# Patient Record
Sex: Male | Born: 1955 | Race: White | Hispanic: No | Marital: Married | Smoking: Current every day smoker
Health system: Southern US, Community
[De-identification: ages and names within clinical notes are randomized; demographics above are authoritative.]

## PROBLEM LIST (undated history)

## (undated) DIAGNOSIS — I2510 Atherosclerotic heart disease of native coronary artery without angina pectoris: Secondary | ICD-10-CM

## (undated) DIAGNOSIS — E1140 Type 2 diabetes mellitus with diabetic neuropathy, unspecified: Secondary | ICD-10-CM

## (undated) DIAGNOSIS — M1990 Unspecified osteoarthritis, unspecified site: Secondary | ICD-10-CM

## (undated) DIAGNOSIS — E785 Hyperlipidemia, unspecified: Secondary | ICD-10-CM

## (undated) DIAGNOSIS — Z720 Tobacco use: Secondary | ICD-10-CM

## (undated) DIAGNOSIS — K219 Gastro-esophageal reflux disease without esophagitis: Secondary | ICD-10-CM

## (undated) DIAGNOSIS — L97529 Non-pressure chronic ulcer of other part of left foot with unspecified severity: Secondary | ICD-10-CM

## (undated) DIAGNOSIS — I10 Essential (primary) hypertension: Secondary | ICD-10-CM

## (undated) DIAGNOSIS — Z87440 Personal history of urinary (tract) infections: Secondary | ICD-10-CM

## (undated) DIAGNOSIS — D649 Anemia, unspecified: Secondary | ICD-10-CM

## (undated) DIAGNOSIS — N529 Male erectile dysfunction, unspecified: Secondary | ICD-10-CM

## (undated) DIAGNOSIS — E1142 Type 2 diabetes mellitus with diabetic polyneuropathy: Secondary | ICD-10-CM

## (undated) DIAGNOSIS — M869 Osteomyelitis, unspecified: Secondary | ICD-10-CM

## (undated) DIAGNOSIS — I119 Hypertensive heart disease without heart failure: Secondary | ICD-10-CM

## (undated) DIAGNOSIS — I5020 Unspecified systolic (congestive) heart failure: Secondary | ICD-10-CM

## (undated) DIAGNOSIS — R55 Syncope and collapse: Secondary | ICD-10-CM

## (undated) DIAGNOSIS — M5430 Sciatica, unspecified side: Secondary | ICD-10-CM

## (undated) DIAGNOSIS — Z972 Presence of dental prosthetic device (complete) (partial): Secondary | ICD-10-CM

## (undated) DIAGNOSIS — E119 Type 2 diabetes mellitus without complications: Secondary | ICD-10-CM

## (undated) DIAGNOSIS — K4090 Unilateral inguinal hernia, without obstruction or gangrene, not specified as recurrent: Secondary | ICD-10-CM

## (undated) DIAGNOSIS — E7800 Pure hypercholesterolemia, unspecified: Secondary | ICD-10-CM

## (undated) DIAGNOSIS — I25118 Atherosclerotic heart disease of native coronary artery with other forms of angina pectoris: Secondary | ICD-10-CM

## (undated) HISTORY — DX: Unspecified systolic (congestive) heart failure: I50.20

## (undated) HISTORY — DX: Syncope and collapse: R55

## (undated) HISTORY — DX: Atherosclerotic heart disease of native coronary artery without angina pectoris: I25.10

## (undated) HISTORY — DX: Hypertensive heart disease without heart failure: I11.9

## (undated) HISTORY — DX: Osteomyelitis, unspecified: M86.9

## (undated) HISTORY — DX: Tobacco use: Z72.0

## (undated) HISTORY — PX: WISDOM TOOTH EXTRACTION: SHX21

## (undated) HISTORY — DX: Sciatica, unspecified side: M54.30

## (undated) HISTORY — PX: GUM SURGERY: SHX658

## (undated) HISTORY — DX: Hyperlipidemia, unspecified: E78.5

## (undated) HISTORY — DX: Male erectile dysfunction, unspecified: N52.9

## (undated) HISTORY — DX: Type 2 diabetes mellitus with diabetic neuropathy, unspecified: E11.40

## (undated) HISTORY — DX: Personal history of urinary (tract) infections: Z87.440

## (undated) HISTORY — DX: Pure hypercholesterolemia, unspecified: E78.00

## (undated) HISTORY — DX: Type 2 diabetes mellitus without complications: E11.9

## (undated) HISTORY — DX: Unilateral inguinal hernia, without obstruction or gangrene, not specified as recurrent: K40.90

---
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
## 1999-11-10 HISTORY — PX: ELBOW ARTHROSCOPY: SHX614

## 2000-03-12 ENCOUNTER — Emergency Department (HOSPITAL_COMMUNITY): Admission: EM | Admit: 2000-03-12 | Discharge: 2000-03-13 | Payer: Self-pay | Admitting: Internal Medicine

## 2000-03-12 ENCOUNTER — Encounter: Payer: Self-pay | Admitting: Internal Medicine

## 2000-03-14 ENCOUNTER — Emergency Department (HOSPITAL_COMMUNITY): Admission: EM | Admit: 2000-03-14 | Discharge: 2000-03-14 | Payer: Self-pay | Admitting: Emergency Medicine

## 2002-11-09 HISTORY — PX: COLONOSCOPY: SHX174

## 2011-04-27 ENCOUNTER — Ambulatory Visit: Payer: Self-pay | Admitting: Family Medicine

## 2011-11-09 ENCOUNTER — Encounter (HOSPITAL_BASED_OUTPATIENT_CLINIC_OR_DEPARTMENT_OTHER): Payer: Self-pay | Admitting: *Deleted

## 2011-11-09 NOTE — Progress Notes (Signed)
To come in for [id number] 

## 2011-11-11 ENCOUNTER — Other Ambulatory Visit: Payer: Self-pay

## 2011-11-11 ENCOUNTER — Encounter (HOSPITAL_BASED_OUTPATIENT_CLINIC_OR_DEPARTMENT_OTHER)
Admission: RE | Admit: 2011-11-11 | Discharge: 2011-11-11 | Disposition: A | Discharge status: Home / Self Care | Payer: 59 | Source: Ambulatory Visit | Attending: Orthopedic Surgery | Admitting: Orthopedic Surgery

## 2011-11-11 LAB — BASIC METABOLIC PANEL
CO2: 27 mEq/L (ref 19–32)
Calcium: 9.1 mg/dL (ref 8.4–10.5)
Chloride: 103 mEq/L (ref 96–112)
GFR calc non Af Amer: >90 mL/min (ref >90)
Potassium: 4.2 mEq/L (ref 3.5–5.1)
Sodium: 137 mEq/L (ref 135–145)

## 2011-11-12 ENCOUNTER — Ambulatory Visit (HOSPITAL_BASED_OUTPATIENT_CLINIC_OR_DEPARTMENT_OTHER): Payer: 59 | Admitting: Anesthesiology

## 2011-11-12 ENCOUNTER — Ambulatory Visit (HOSPITAL_BASED_OUTPATIENT_CLINIC_OR_DEPARTMENT_OTHER)
Admission: RE | Admit: 2011-11-12 | Discharge: 2011-11-12 | Disposition: A | Discharge status: Home / Self Care | Payer: 59 | Source: Ambulatory Visit | Attending: Orthopedic Surgery | Admitting: Orthopedic Surgery

## 2011-11-12 ENCOUNTER — Encounter (HOSPITAL_BASED_OUTPATIENT_CLINIC_OR_DEPARTMENT_OTHER): Payer: Self-pay | Admitting: Anesthesiology

## 2011-11-12 ENCOUNTER — Encounter (HOSPITAL_BASED_OUTPATIENT_CLINIC_OR_DEPARTMENT_OTHER): Payer: Self-pay | Admitting: *Deleted

## 2011-11-12 ENCOUNTER — Encounter (HOSPITAL_BASED_OUTPATIENT_CLINIC_OR_DEPARTMENT_OTHER)
Admission: RE | Disposition: A | Discharge status: Home / Self Care | Payer: Self-pay | Source: Ambulatory Visit | Attending: Orthopedic Surgery

## 2011-11-12 DIAGNOSIS — M7021 Olecranon bursitis, right elbow: Secondary | ICD-10-CM

## 2011-11-12 DIAGNOSIS — M7020 Olecranon bursitis, unspecified elbow: Secondary | ICD-10-CM | POA: Insufficient documentation

## 2011-11-12 DIAGNOSIS — Z01810 Encounter for preprocedural cardiovascular examination: Secondary | ICD-10-CM | POA: Insufficient documentation

## 2011-11-12 DIAGNOSIS — Z01812 Encounter for preprocedural laboratory examination: Secondary | ICD-10-CM | POA: Insufficient documentation

## 2011-11-12 HISTORY — DX: Unspecified osteoarthritis, unspecified site: M19.90

## 2011-11-12 HISTORY — DX: Essential (primary) hypertension: I10

## 2011-11-12 HISTORY — DX: Gastro-esophageal reflux disease without esophagitis: K21.9

## 2011-11-12 HISTORY — PX: OLECRANON BURSECTOMY: SHX2097

## 2011-11-12 LAB — GLUCOSE, CAPILLARY: Glucose-Capillary: 116 mg/dL — ABNORMAL HIGH (ref 70–99)

## 2011-11-12 SURGERY — BURSECTOMY, ELBOW
Anesthesia: General | Laterality: Right

## 2011-11-12 MED ORDER — KETOROLAC TROMETHAMINE 30 MG/ML IJ SOLN: 15.0000 mg | Freq: Once | INTRAMUSCULAR | Status: DC | PRN

## 2011-11-12 MED ORDER — MIDAZOLAM HCL 5 MG/5ML IJ SOLN
INTRAMUSCULAR | Status: DC | PRN
  Administered 2011-11-12: 1 mg via INTRAVENOUS

## 2011-11-12 MED ORDER — BUPIVACAINE HCL 0.25 % IJ SOLN
INTRAMUSCULAR | Status: DC | PRN
  Administered 2011-11-12: 6 mL

## 2011-11-12 MED ORDER — FENTANYL CITRATE 0.05 MG/ML IJ SOLN
INTRAMUSCULAR | Status: DC | PRN
  Administered 2011-11-12: 50 ug via INTRAVENOUS
  Administered 2011-11-12: 100 ug via INTRAVENOUS

## 2011-11-12 MED ORDER — LACTATED RINGERS IV SOLN
INTRAVENOUS | Status: DC
  Administered 2011-11-12 (×2): via INTRAVENOUS

## 2011-11-12 MED ORDER — PROMETHAZINE HCL 25 MG/ML IJ SOLN: 6.2500 mg | INTRAMUSCULAR | Status: DC | PRN

## 2011-11-12 MED ORDER — CEFAZOLIN SODIUM 1-5 GM-% IV SOLN
1.0000 g | INTRAVENOUS | Status: AC
  Administered 2011-11-12: 1 g via INTRAVENOUS

## 2011-11-12 MED ORDER — HYDROMORPHONE HCL PF 1 MG/ML IJ SOLN
0.2500 mg | INTRAMUSCULAR | Status: DC | PRN
  Administered 2011-11-12 (×4): 0.5 mg via INTRAVENOUS

## 2011-11-12 MED ORDER — LIDOCAINE HCL (CARDIAC) 20 MG/ML IV SOLN
INTRAVENOUS | Status: DC | PRN
  Administered 2011-11-12: 100 mg via INTRAVENOUS

## 2011-11-12 MED ORDER — HYDROCODONE-ACETAMINOPHEN 5-325 MG PO TABS: 1.0000 | ORAL_TABLET | Freq: Four times a day (QID) | ORAL | Status: AC | PRN

## 2011-11-12 MED ORDER — MEPERIDINE HCL 25 MG/ML IJ SOLN: 6.2500 mg | INTRAMUSCULAR | Status: DC | PRN

## 2011-11-12 MED ORDER — DEXAMETHASONE SODIUM PHOSPHATE 4 MG/ML IJ SOLN
INTRAMUSCULAR | Status: DC | PRN
  Administered 2011-11-12: 4 mg via INTRAVENOUS

## 2011-11-12 MED ORDER — ONDANSETRON HCL 4 MG/2ML IJ SOLN
INTRAMUSCULAR | Status: DC | PRN
  Administered 2011-11-12: 4 mg via INTRAVENOUS

## 2011-11-12 MED ORDER — CHLORHEXIDINE GLUCONATE 4 % EX LIQD: 60.0000 mL | Freq: Once | CUTANEOUS | Status: DC

## 2011-11-12 MED ORDER — PROPOFOL 10 MG/ML IV EMUL
INTRAVENOUS | Status: DC | PRN
  Administered 2011-11-12: 200 mg via INTRAVENOUS

## 2011-11-12 MED ORDER — HYDROCODONE-ACETAMINOPHEN 5-325 MG PO TABS
1.0000 | ORAL_TABLET | Freq: Four times a day (QID) | ORAL | Status: DC | PRN
  Administered 2011-11-12: 1 via ORAL

## 2011-11-12 SURGICAL SUPPLY — 57 items
BANDAGE CONFORM 3  STR LF (GAUZE/BANDAGES/DRESSINGS) IMPLANT
BANDAGE ELASTIC 4 VELCRO ST LF (GAUZE/BANDAGES/DRESSINGS) ×4 IMPLANT
BLADE SURG 15 STRL LF DISP TIS (BLADE) ×1 IMPLANT
BLADE SURG 15 STRL SS (BLADE) ×2
BNDG CMPR 9X4 STRL LF SNTH (GAUZE/BANDAGES/DRESSINGS) ×1
BNDG COHESIVE 4X5 TAN STRL (GAUZE/BANDAGES/DRESSINGS) ×2 IMPLANT
BNDG ESMARK 4X9 LF (GAUZE/BANDAGES/DRESSINGS) ×2 IMPLANT
BRUSH SCRUB EZ PLAIN DRY (MISCELLANEOUS) ×1 IMPLANT
CANISTER SUCTION 1200CC (MISCELLANEOUS) ×2 IMPLANT
CORDS BIPOLAR (ELECTRODE) ×1 IMPLANT
COVER TABLE BACK 60X90 (DRAPES) ×2 IMPLANT
CUFF TOURNIQUET SINGLE 18IN (TOURNIQUET CUFF) IMPLANT
DECANTER SPIKE VIAL GLASS SM (MISCELLANEOUS) IMPLANT
DRAPE EXTREMITY T 121X128X90 (DRAPE) ×2 IMPLANT
DRSG EMULSION OIL 3X3 NADH (GAUZE/BANDAGES/DRESSINGS) ×2 IMPLANT
DURAPREP 26ML APPLICATOR (WOUND CARE) ×2 IMPLANT
ELECT NDL TIP 2.8 STRL (NEEDLE) IMPLANT
ELECT NEEDLE TIP 2.8 STRL (NEEDLE) IMPLANT
ELECT REM PT RETURN 9FT ADLT (ELECTROSURGICAL) ×2
ELECTRODE REM PT RTRN 9FT ADLT (ELECTROSURGICAL) IMPLANT
GOWN PREVENTION PLUS XXLARGE (GOWN DISPOSABLE) ×2 IMPLANT
NDL 1/2 CIR CATGUT .05X1.09 (NEEDLE) IMPLANT
NDL HYPO 25X1 1.5 SAFETY (NEEDLE) IMPLANT
NEEDLE 1/2 CIR CATGUT .05X1.09 (NEEDLE) IMPLANT
NEEDLE HYPO 25X1 1.5 SAFETY (NEEDLE) ×2 IMPLANT
NS IRRIG 1000ML POUR BTL (IV SOLUTION) ×2 IMPLANT
PACK BASIN DAY SURGERY FS (CUSTOM PROCEDURE TRAY) ×2 IMPLANT
PAD CAST 4YDX4 CTTN HI CHSV (CAST SUPPLIES) ×3 IMPLANT
PADDING CAST ABS 4INX4YD NS (CAST SUPPLIES)
PADDING CAST ABS COTTON 4X4 ST (CAST SUPPLIES) ×1 IMPLANT
PADDING CAST COTTON 4X4 STRL (CAST SUPPLIES) ×2
PENCIL BUTTON HOLSTER BLD 10FT (ELECTRODE) ×1 IMPLANT
SLING ARM FOAM STRAP LRG (SOFTGOODS) IMPLANT
SLING ARM FOAM STRAP MED (SOFTGOODS) IMPLANT
SPLINT FAST PLASTER 5X30 (CAST SUPPLIES)
SPLINT PLASTER CAST FAST 5X30 (CAST SUPPLIES) IMPLANT
STOCKINETTE 4X48 STRL (DRAPES) ×2 IMPLANT
SUCTION FRAZIER TIP 10 FR DISP (SUCTIONS) ×2 IMPLANT
SUT MNCRL AB 3-0 PS2 18 (SUTURE) IMPLANT
SUT MON AB 3-0 SH 27 (SUTURE)
SUT MON AB 3-0 SH27 (SUTURE) IMPLANT
SUT PROLENE 3 0 PS 1 (SUTURE) IMPLANT
SUT PROLENE 3 0 PS 2 (SUTURE) ×3 IMPLANT
SUT PROLENE 4 0 PS 2 18 (SUTURE) IMPLANT
SUT VIC AB 0 CT1 27 (SUTURE)
SUT VIC AB 0 CT1 27XBRD ANBCTR (SUTURE) IMPLANT
SUT VIC AB 2-0 PS2 27 (SUTURE) IMPLANT
SUT VIC AB 2-0 SH 27 (SUTURE)
SUT VIC AB 2-0 SH 27XBRD (SUTURE) IMPLANT
SUT VICRYL 4-0 PS2 18IN ABS (SUTURE) IMPLANT
SYR BULB 3OZ (MISCELLANEOUS) ×2 IMPLANT
SYR CONTROL 10ML LL (SYRINGE) ×1 IMPLANT
TOWEL OR 17X24 6PK STRL BLUE (TOWEL DISPOSABLE) ×2 IMPLANT
TRAY DSU PREP LF (CUSTOM PROCEDURE TRAY) ×2 IMPLANT
TUBE CONNECTING 20X1/4 (TUBING) ×2 IMPLANT
UNDERPAD 30X30 INCONTINENT (UNDERPADS AND DIAPERS) ×2 IMPLANT
WATER STERILE IRR 1000ML POUR (IV SOLUTION) ×1 IMPLANT

## 2011-11-12 NOTE — H&P (Signed)
Brian Dean is an 56 y.o. male.   Chief Complaint: RIGHT ELBOW WOUND HPI: CHRONIC RIGHT ELBOW WOUND/OLECRANON BURSITIS, PT ELECTS SURGERY FOR CHRONIC NONHEALING WOUND ON ELBOW. PT FOLLOWED FOR WEEKS IN OFFICE HERE FOR SURGERY.  Past Medical History  Diagnosis Date  . Hypertension   . GERD (gastroesophageal reflux disease)   . Arthritis     Past Surgical History  Procedure Date  . Elbow arthroscopy 2001    lt  . Colonoscopy   . Gum surgery   . Wisdom tooth extraction     History reviewed. No pertinent family history. Social History:  reports that he has been smoking.  He does not have any smokeless tobacco history on file. He reports that he drinks alcohol. He reports that he does not use illicit drugs.  Allergies: No Known Allergies  Medications Prior to Admission  Medication Dose Route Frequency Provider Last Rate Last Dose  . ceFAZolin (ANCEF) IVPB 1 g/50 mL premix  1 g Intravenous 60 min Pre-Op Sharma Covert      . chlorhexidine (HIBICLENS) 4 % liquid 4 application  60 mL Topical Once Louis Ivery W Trea Latner      . lactated ringers infusion   Intravenous Continuous Bedelia Person, MD 20 mL/hr at 11/12/11 1247     Medications Prior to Admission  Medication Sig Dispense Refill  . ascorbic acid (VITAMIN C) 250 MG CHEW Chew 250 mg by mouth daily.        Marland Kitchen HYDROcodone-acetaminophen (NORCO) 5-325 MG per tablet Take 1 tablet by mouth every 6 (six) hours as needed.        Marland Kitchen lisinopril (PRINIVIL,ZESTRIL) 5 MG tablet Take 5 mg by mouth daily.        . metFORMIN (GLUCOPHAGE) 500 MG tablet Take 500 mg by mouth every evening.        Marland Kitchen omeprazole (PRILOSEC) 20 MG capsule Take 20 mg by mouth daily.          Results for orders placed during the hospital encounter of 11/12/11 (from the past 48 hour(s))  BASIC METABOLIC PANEL     Status: Abnormal   Collection Time   11/11/11 12:19 PM      Component Value Range Comment   Sodium 137  135 - 145 (mEq/L)    Potassium 4.2  3.5 - 5.1 (mEq/L)    Chloride 103  96 - 112 (mEq/L)    CO2 27  19 - 32 (mEq/L)    Glucose, Bld 173 (*) 70 - 99 (mg/dL)    BUN 9  6 - 23 (mg/dL)    Creatinine, Ser 7.82  0.50 - 1.35 (mg/dL)    Calcium 9.1  8.4 - 10.5 (mg/dL)    GFR calc non Af Amer >90  >90 (mL/min)    GFR calc Af Amer >90  >90 (mL/min)   GLUCOSE, CAPILLARY     Status: Abnormal   Collection Time   11/12/11 12:51 PM      Component Value Range Comment   Glucose-Capillary 117 (*) 70 - 99 (mg/dL)    No results found.  NO RECENT ILLNESSES HOSPITALIZATIONS  Blood pressure 147/84, pulse 68, temperature 97.5 F (36.4 C), temperature source Oral, resp. rate 18, height 6' (1.829 m), weight 78.926 kg (174 lb), SpO2 99.00%.  General Appearance:  Alert, cooperative, no distress, appears stated age  Head:  Normocephalic, without obvious abnormality, atraumatic  Eyes:  Pupils equal, conjunctiva/corneas clear,         Throat: Lips, mucosa, and  tongue normal; teeth and gums normal  Neck: No visible masses     Lungs:   respirations unlabored  Chest Wall:  No tenderness or deformity  Heart:  Regular rate and rhythm,  Abdomen:   Soft, non-tender,         Extremities: RIGHT ELBOW IN BANDAGE. ABLE TO FLEX AND EXTEND THUMB FINGERS WARM WELL PERFUSED GOOD HAND STRENGTH  Pulses: 2+ and symmetric  Skin: Skin color, texture, turgor normal, no rashes or lesions     Neurologic: Normal    Assessment/Plan:  RIGHT ELBOW OLECRANON BURSITIS WITH OPEN WOUND  RIGHT ELBOW OLECRANON BURSECTOMY AND WOUND CLOSURE  R/B/A DISCUSSED WITH PT IN OFFICE.  PT VOICED UNDERSTANDING OF PLAN CONSENT SIGNED DAY OF SURGERY PT SEEN AND EXAMINED PRIOR TO OPERATIVE PROCEDURE/DAY OF SURGERY SITE MARKED. QUESTIONS ANSWERED WILL GO HOME FOLLOWING SURGERY  Sharma Covert 11/12/2011, 2:02 PM

## 2011-11-12 NOTE — Anesthesia Procedure Notes (Signed)
Procedure Name: LMA Insertion Date/Time: 11/12/2011 2:24 PM Performed by: Meyer Russel Pre-anesthesia Checklist: Patient identified, Emergency Drugs available, Suction available, Patient being monitored and Timeout performed Patient Re-evaluated:Patient Re-evaluated prior to inductionOxygen Delivery Method: Circle System Utilized Preoxygenation: Pre-oxygenation with 100% oxygen Intubation Type: IV induction Ventilation: Mask ventilation without difficulty LMA: LMA inserted LMA Size: 4.0 Number of attempts: 1 Placement Confirmation: breath sounds checked- equal and bilateral and positive ETCO2 Tube secured with: Tape Dental Injury: Teeth and Oropharynx as per pre-operative assessment

## 2011-11-12 NOTE — Anesthesia Postprocedure Evaluation (Signed)
  Anesthesia Post-op Note  Patient: Brian Dean  Procedure(s) Performed:  OLECRANON BURSA - olecracnon bursectomy with delayed closure  Patient Location: PACU  Anesthesia Type: General  Level of Consciousness: awake  Airway and Oxygen Therapy: Patient Spontanous Breathing  Post-op Pain: mild  Post-op Assessment: Post-op Vital signs reviewed  Post-op Vital Signs: stable  Complications: No apparent anesthesia complications

## 2011-11-12 NOTE — Anesthesia Preprocedure Evaluation (Addendum)
Anesthesia Evaluation  Patient identified by MRN, date of birth, ID band Patient awake    History of Anesthesia Complications Negative for: history of anesthetic complications  Airway Mallampati: I  Neck ROM: Full    Dental  (+) Partial Lower and Partial Upper   Pulmonary Current Smoker,  clear to auscultation        Cardiovascular hypertension, Pt. on medications Regular Normal    Neuro/Psych Negative Neurological ROS     GI/Hepatic Neg liver ROS, GERD-  ,  Endo/Other  Diabetes mellitus-  Renal/GU      Musculoskeletal   Abdominal   Peds  Hematology   Anesthesia Other Findings   Reproductive/Obstetrics                           Anesthesia Physical Anesthesia Plan  ASA: II  Anesthesia Plan: General   Post-op Pain Management:    Induction: Intravenous  Airway Management Planned: LMA  Additional Equipment:   Intra-op Plan:   Post-operative Plan: Extubation in OR  Informed Consent: I have reviewed the patients History and Physical, chart, labs and discussed the procedure including the risks, benefits and alternatives for the proposed anesthesia with the patient or authorized representative who has indicated his/her understanding and acceptance.   Dental advisory given  Plan Discussed with: CRNA and Surgeon  Anesthesia Plan Comments:         Anesthesia Quick Evaluation

## 2011-11-12 NOTE — Transfer of Care (Signed)
Immediate Anesthesia Transfer of Care Note  Patient: Brian Dean  Procedure(s) Performed:  OLECRANON BURSA - olecracnon bursectomy with delayed closure  Patient Location: PACU  Anesthesia Type: General  Level of Consciousness: sedated  Airway & Oxygen Therapy: Patient Spontanous Breathing and Patient connected to face mask oxygen  Post-op Assessment: Report given to PACU RN and Post -op Vital signs reviewed and stable  Post vital signs: stable  Complications: No apparent anesthesia complications

## 2011-11-12 NOTE — Brief Op Note (Signed)
11/12/2011  3:34 PM  PATIENT:  Brian Dean  56 y.o. male  PRE-OPERATIVE DIAGNOSIS:  right elbow open wound  POST-OPERATIVE DIAGNOSIS:  right elbow open wound  PROCEDURE:  Procedure(s): OLECRANON BURSA  SURGEON:  Surgeon(s): Sharma Covert  PHYSICIAN ASSISTANT:   ASSISTANTS: none   ANESTHESIA:   general  EBL:  Total I/O In: 1700 [I.V.:1700] Out: -   BLOOD ADMINISTERED:none  DRAINS: none   LOCAL MEDICATIONS USED:  MARCAINE 6CC  SPECIMEN:  No Specimen  DISPOSITION OF SPECIMEN:  N/A  COUNTS:  YES  TOURNIQUET:   Total Tourniquet Time Documented: Upper Arm (Right) - 8 minutes  DICTATION: [id number]  PLAN OF CARE: Discharge to home after PACU  PATIENT DISPOSITION:  PACU - hemodynamically stable.   Delay start of Pharmacological VTE agent (>24hrs) due to surgical blood loss or risk of bleeding:  {YES/NO/NOT APPLICABLE:20182

## 2011-11-13 NOTE — Op Note (Signed)
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  ATTENDING PHYSICIAN:  Madelynn Done, MD who was scrubbed and present for the entire procedure.  ASSISTANT SURGEON:  None.  SURGICAL PROCEDURE: 1. Right elbow olecranon bursectomy. 2. Right elbow rotational skin flap, less than 3 cm rotational flap.  ANESTHESIA:  General via LMA.  SURGICAL INDICATIONS:  Mr. Warehime is a 56 year old right-hand-dominant gentleman with persistent chronic right elbow wound with a draining bursa.  The patient elected to undergo the above procedure.  Risks, benefits, and alternatives were discussed in detail with the patient and signed informed consent was obtained.  Risks include, but not limited to bleeding, infection, damage to nearby nerves, arteries, or tendons, loss of motion of the elbow, wrist and digits, persistent wound and stiffness of the elbow and need for further surgical intervention.  DESCRIPTION OF PROCEDURE:  The patient was properly identified in the preop holding area.  With a marker pen, a mark was made on the right elbow to indicate the correct operative site.  The patient then brought back to the operating room and placed supine on anesthesia room table where general anesthesia was administered.  The patient tolerated it well.  A curvilinear incision made directly over the posterior aspect of the elbow.  Limb was then elevated using Esmarch exsanguination and tourniquet insufflated.  An elliptical incision was made over the chronic open wound and sinus tract.   Dissection was then carried down through the skin and subcutaneous tissues and olecranon bursectomy was then carried out circumferentially.  Removal of the fibrinous exudate of the sinus tract area was then carried out and appeared to extend through the triceps fascia down into the joint.  Following this, the wound was then thoroughly irrigated.  Following this, a 3-cm rotational flap was then carried out and the skin was advanced by undermining the soft disk and then advancing and closing it with a 3-0 Prolene horizontal mattress and simple sutures.  There was a mild amount of tension on it.  Could flex the elbow approximately to 45 degrees but the elbow was kept in full extension to allow the soft tissues to heal without tension.  The wound was then thoroughly irrigated at every level before closure.  The tourniquet had been deflated and hemostasis was obtained with electrocautery.  A sterile compressive bandage then applied.  The patient then placed in a long-arm splint keeping in near full extension, extubated and taken to recovery room in good condition.  POSTOPERATIVE PLAN:  The patient discharged home, seen back in the office in approximately 1 week for wound check, and then long-arm splint for a total of 2-3 weeks to allow the soft  tissues to heal without tension, and gradually use activity if we can get the soft tissue to heal.  I do have some concern of the soft tissue and the poor tissue quality, but hopefully this modality would allow the wound to heal and get the sinus area to stop draining.     Madelynn Done, MD     FWO/MEDQ  D:  11/12/2011  T:  11/13/2011  Job:  [id number]

## 2011-11-15 LAB — TISSUE CULTURE: Culture: NO GROWTH

## 2011-11-16 ENCOUNTER — Encounter (HOSPITAL_BASED_OUTPATIENT_CLINIC_OR_DEPARTMENT_OTHER): Payer: Self-pay | Admitting: Orthopedic Surgery

## 2012-09-07 ENCOUNTER — Ambulatory Visit: Payer: Self-pay | Admitting: Family Medicine

## 2013-06-09 DIAGNOSIS — Z87440 Personal history of urinary (tract) infections: Secondary | ICD-10-CM

## 2013-06-09 DIAGNOSIS — Z87448 Personal history of other diseases of urinary system: Secondary | ICD-10-CM

## 2013-06-09 HISTORY — DX: Personal history of other diseases of urinary system: Z87.448

## 2013-06-09 HISTORY — DX: Personal history of urinary (tract) infections: Z87.440

## 2013-07-07 LAB — URINALYSIS, COMPLETE
Bilirubin,UR: NEGATIVE
Glucose,UR: NEGATIVE mg/dL (ref 0–75)
Ketone: NEGATIVE
Ph: 5 (ref 4.5–8.0)
Protein: 100
Specific Gravity: 1.019 (ref 1.003–1.030)
Squamous Epithelial: 1
WBC UR: 366 /HPF (ref 0–5)

## 2013-07-07 LAB — COMPREHENSIVE METABOLIC PANEL
Albumin: 3.5 g/dL (ref 3.4–5.0)
Alkaline Phosphatase: 70 U/L (ref 50–136)
Anion Gap: 5 — ABNORMAL LOW (ref 7–16)
Bilirubin,Total: 1.5 mg/dL — ABNORMAL HIGH (ref 0.2–1.0)
Co2: 26 mmol/L (ref 21–32)
Creatinine: 0.97 mg/dL (ref 0.60–1.30)
EGFR (African American): >60
EGFR (Non-African Amer.): >60
Glucose: 105 mg/dL — ABNORMAL HIGH (ref 65–99)
Osmolality: 263 (ref 275–301)
Potassium: 4.3 mmol/L (ref 3.5–5.1)
Sodium: 130 mmol/L — ABNORMAL LOW (ref 136–145)
Total Protein: 7.8 g/dL (ref 6.4–8.2)

## 2013-07-07 LAB — CBC
HCT: 38.6 % — ABNORMAL LOW (ref 40.0–52.0)
HGB: 13.6 g/dL (ref 13.0–18.0)
MCH: 31.1 pg (ref 26.0–34.0)
MCHC: 35.2 g/dL (ref 32.0–36.0)
RBC: 4.37 10*6/uL — ABNORMAL LOW (ref 4.40–5.90)
WBC: 12 10*3/uL — ABNORMAL HIGH (ref 3.8–10.6)

## 2013-07-08 ENCOUNTER — Inpatient Hospital Stay: Payer: Self-pay | Admitting: Internal Medicine

## 2013-07-08 LAB — CBC WITH DIFFERENTIAL/PLATELET
Eosinophil #: 0.1 10*3/uL (ref 0.0–0.7)
Eosinophil %: 0.7 %
Lymphocyte #: 1 10*3/uL (ref 1.0–3.6)
Lymphocyte %: 10 %
MCHC: 35.4 g/dL (ref 32.0–36.0)
MCV: 88 fL (ref 80–100)
Monocyte #: 1.1 x10 3/mm — ABNORMAL HIGH (ref 0.2–1.0)
Neutrophil #: 7.7 10*3/uL — ABNORMAL HIGH (ref 1.4–6.5)
Neutrophil %: 77.4 %
RBC: 3.83 10*6/uL — ABNORMAL LOW (ref 4.40–5.90)
RDW: 12.8 % (ref 11.5–14.5)

## 2013-07-08 LAB — HEPATIC FUNCTION PANEL A (ARMC)
Albumin: 2.7 g/dL — ABNORMAL LOW
Alkaline Phosphatase: 62 U/L
Bilirubin, Direct: 0.2 mg/dL
Bilirubin,Total: 0.9 mg/dL
SGOT(AST): 22 U/L
SGPT (ALT): 34 U/L
Total Protein: 6.4 g/dL

## 2013-07-08 LAB — LIPASE, BLOOD: Lipase: 98 U/L

## 2013-07-08 LAB — BASIC METABOLIC PANEL
BUN: 16 mg/dL (ref 7–18)
Calcium, Total: 8.3 mg/dL — ABNORMAL LOW (ref 8.5–10.1)
Chloride: 103 mmol/L (ref 98–107)
Co2: 24 mmol/L (ref 21–32)
Creatinine: 0.91 mg/dL (ref 0.60–1.30)
EGFR (Non-African Amer.): >60
Glucose: 157 mg/dL — ABNORMAL HIGH (ref 65–99)
Osmolality: 271 (ref 275–301)
Sodium: 133 mmol/L — ABNORMAL LOW (ref 136–145)

## 2013-07-09 LAB — SODIUM: Sodium: 135 mmol/L — ABNORMAL LOW (ref 136–145)

## 2013-07-09 LAB — HEMOGLOBIN A1C: Hemoglobin A1C: 5.9 % (ref 4.2–6.3)

## 2013-07-10 LAB — URINE CULTURE

## 2013-07-17 ENCOUNTER — Ambulatory Visit (INDEPENDENT_AMBULATORY_CARE_PROVIDER_SITE_OTHER): Payer: 59 | Admitting: General Surgery

## 2013-07-17 ENCOUNTER — Encounter: Payer: Self-pay | Admitting: General Surgery

## 2013-07-17 VITALS — BP 130/70 | HR 78 | Resp 14 | Ht 72.0 in | Wt 177.0 lb

## 2013-07-17 DIAGNOSIS — K4090 Unilateral inguinal hernia, without obstruction or gangrene, not specified as recurrent: Secondary | ICD-10-CM | POA: Insufficient documentation

## 2013-07-17 DIAGNOSIS — K8020 Calculus of gallbladder without cholecystitis without obstruction: Secondary | ICD-10-CM

## 2013-07-17 NOTE — Progress Notes (Signed)
Patient ID: Brian Dean, male   DOB: 30-May-1956, 57 y.o.   MRN: [id number]  Chief Complaint  Patient presents with  . Other    left inguinal hernia, and gallstone    HPI Brian Dean is a 57 y.o. male  Here for abnominal hernia and gallstone found on CT scan. He had a CT done due to kidney problems. He had several incidences of incontinence, some bilateral back pain, blood in the urine, and urgency for several days that led up to this. He was admitted to the hospital and place on IV antibiotics and the symptoms have mostly cleared. During his hospitalization he was diagnosed with pyelonephritis of unclear etiology. Urine culture did show 100,000 colonies of Escherichia coli. His incontinence is likely secondary to overflow incontinence, and this has resolved. He is been evaluated by Brian Dean, M.D. in the past, and is scheduled to see him tomorrow in this regard.  He noticed the hernia about 3 years ago. It has not bothered him very much but will pop out occasionally and he has pain when this occurs. He identified the area of the reported hernia being just inferior and about 6 cm lateral to the umbilicus. He is not appreciate any pain or swelling in the inguinal area   He says he has not noticed any gallbladder symptoms, benign any dietary intolerance, postprandial pain or bloating.Marland Kitchen  HPI  Past Medical History  Diagnosis Date  . Hypertension   . GERD (gastroesophageal reflux disease)   . Arthritis   . Diabetes mellitus without complication     Past Surgical History  Procedure Laterality Date  . Elbow arthroscopy  2001    lt  . Colonoscopy    . Gum surgery    . Wisdom tooth extraction    . Olecranon bursectomy  11/12/2011    Procedure: OLECRANON BURSA;  Surgeon: Brian Dean;  Location: [facility];  Service: Orthopedics;  Laterality: Right;  olecracnon bursectomy with delayed closure    No family history on file.  Social History History  Substance Use  Topics  . Smoking status: Current Every Day Smoker -- 1.50 packs/day for 40 years  . Smokeless tobacco: Never Used  . Alcohol Use: Yes    No Known Allergies  Current Outpatient Prescriptions  Medication Sig Dispense Refill  . ascorbic acid (VITAMIN C) 250 MG CHEW Chew 250 mg by mouth daily.        Marland Kitchen aspirin 81 MG tablet Take 81 mg by mouth daily.      Marland Kitchen BAYER CONTOUR TEST test strip       . Brompheniramine Maleate (BPM) 6 MG TB12 Take by mouth.      . Cinnamon 500 MG capsule Take 500 mg by mouth 2 (two) times daily.      Marland Kitchen HYDROcodone-acetaminophen (NORCO) 5-325 MG per tablet Take 1 tablet by mouth every 6 (six) hours as needed.        . lidocaine (LIDODERM) 5 %       . lisinopril (PRINIVIL,ZESTRIL) 5 MG tablet Take 5 mg by mouth daily.        . metFORMIN (GLUCOPHAGE) 500 MG tablet Take 500 mg by mouth every evening.        Marland Kitchen MICROLET LANCETS MISC       . omeprazole (PRILOSEC) 20 MG capsule Take 20 mg by mouth daily.         No current facility-administered medications for this visit.    Review of  Systems Review of Systems  Constitutional: Negative.   Respiratory: Negative.   Cardiovascular: Negative.     Blood pressure 130/70, pulse 78, resp. rate 14, height 6' (1.829 m), weight 177 lb (80.287 kg).  Physical Exam Physical Exam  Constitutional: He is oriented to person, place, and time. He appears well-developed and well-nourished.  Neck: Neck supple.  Cardiovascular: Normal rate and regular rhythm.   Pulmonary/Chest: Effort normal and breath sounds normal.  Abdominal: Soft. Normal appearance. There is no hepatosplenomegaly. There is no tenderness.  Lymphadenopathy:    He has no cervical adenopathy.  Neurological: He is alert and oriented to person, place, and time.   Examination was completed in the supine and standing position. With vigorous coughing and straining no abnormalities appreciated in the right groin. There may be the faintest impulse on the left, but there is  no tenderness, the patient has been unaware of any swelling in this area and surgical intervention for this is not recommended.  Standing examination where the patient has reported the mass inferior and lateral to the umbilicus on the left side showed no discernible abnormality. No abdominal tenderness was appreciated.  Data Reviewed Hospital discharge summary of 07/09/2013.  Abdominal pelvic CT dated 07/07/2013. This study showed a large calcified stone in the neck of the gallbladder. No acute bowel abnormality. Enlargement of the prostate with impression on the urinary base. Bilateral adrenal masses thought likely to represent adenomas. Fat-containing left inguinal hernia.  Assessment    #1 asymptomatic but sizable calcified gallstone. Elective surgical resection when his cardiac and urinary tract status is clear he was history of diabetes.  #2 no clinical evidence of inguinal hernia. Observation alone.  #3 report of left-sided bulge in the area of a spray daily and hernia, unlikely in a male. The area can be evaluated at the time of cholecystectomy.  #4 cardiac abnormality identified during hospitalization with left axis deviation left ventricular hypertrophy, QRS widening and early repolarization. Old inferior infarct. When cardiac clearance is obtained, elective cholecystectomy would be appropriate.    Plan    The patient will contact the office when his urinary symptoms have resolved and he has undergone his evaluation with the cardiology service.     Patient to consider having gallbladder removed when medically stable. He will call the office once ready to arrange.  Brian Dean 07/17/2013, 8:34 PM

## 2013-07-17 NOTE — Patient Instructions (Addendum)
.Laparoscopic Cholecystectomy Laparoscopic cholecystectomy is surgery to remove the gallbladder. The gallbladder is located slightly to the right of center in the abdomen, behind the liver. It is a concentrating and storage sac for the bile produced in the liver. Bile aids in the digestion and absorption of fats. Gallbladder disease (cholecystitis) is an inflammation of your gallbladder. This condition is usually caused by a buildup of gallstones (cholelithiasis) in your gallbladder. Gallstones can block the flow of bile, resulting in inflammation and pain. In severe cases, emergency surgery may be required. When emergency surgery is not required, you will have time to prepare for the procedure. Laparoscopic surgery is an alternative to open surgery. Laparoscopic surgery usually has a shorter recovery time. Your common bile duct may also need to be examined and explored. Your caregiver will discuss this with you if he or she feels this should be done. If stones are found in the common bile duct, they may be removed. LET YOUR CAREGIVER KNOW ABOUT:  Allergies to food or medicine.  Medicines taken, including vitamins, herbs, eyedrops, over-the-counter medicines, and creams.  Use of steroids (by mouth or creams).  Previous problems with anesthetics or numbing medicines.  History of bleeding problems or blood clots.  Previous surgery.  Other health problems, including diabetes and kidney problems.  Possibility of pregnancy, if this applies. RISKS AND COMPLICATIONS All surgery is associated with risks. Some problems that may occur following this procedure include:  Infection.  Damage to the common bile duct, nerves, arteries, veins, or other internal organs such as the stomach or intestines.  Bleeding.  A stone may remain in the common bile duct. BEFORE THE PROCEDURE  Do not take aspirin for 3 days prior to surgery or blood thinners for 1 week prior to surgery.  Do not eat or drink  anything after midnight the night before surgery.  Let your caregiver know if you develop a cold or other infectious problem prior to surgery.  You should be present 60 minutes before the procedure or as directed. PROCEDURE  You will be given medicine that makes you sleep (general anesthetic). When you are asleep, your surgeon will make several small cuts (incisions) in your abdomen. One of these incisions is used to insert a small, lighted scope (laparoscope) into the abdomen. The laparoscope helps the surgeon see into your abdomen. Carbon dioxide gas will be pumped into your abdomen. The gas allows more room for the surgeon to perform your surgery. Other operating instruments are inserted through the other incisions. Laparoscopic procedures may not be appropriate when:  There is major scarring from previous surgery.  The gallbladder is extremely inflamed.  There are bleeding disorders or unexpected cirrhosis of the liver.  A pregnancy is near term.  Other conditions make the laparoscopic procedure impossible. If your surgeon feels it is not safe to continue with a laparoscopic procedure, he or she will perform an open abdominal procedure. In this case, the surgeon will make an incision to open the abdomen. This gives the surgeon a larger view and field to work within. This may allow the surgeon to perform procedures that sometimes cannot be performed with a laparoscope alone. Open surgery has a longer recovery time. AFTER THE PROCEDURE  You will be taken to the recovery area where a nurse will watch and check your progress.  You may be allowed to go home the same day.  Do not resume physical activities until directed by your caregiver.  You may resume a normal diet and  activities as directed. Document Released: 10/26/2005 Document Revised: 01/18/2012 Document Reviewed: 04/10/2011 [facility] Patient Information 2014 [place], [place].  Consider having gallbladder removed when medically  stable

## 2013-07-18 ENCOUNTER — Telehealth: Payer: Self-pay | Admitting: *Deleted

## 2013-07-18 NOTE — Telephone Encounter (Signed)
ERROR

## 2013-07-23 NOTE — Progress Notes (Signed)
Can we set this patient up for preop eval in the [place] office thx [employer]

## 2013-07-24 ENCOUNTER — Encounter: Payer: Self-pay | Admitting: Cardiovascular Disease

## 2013-07-24 ENCOUNTER — Ambulatory Visit (INDEPENDENT_AMBULATORY_CARE_PROVIDER_SITE_OTHER): Payer: 59 | Admitting: Cardiovascular Disease

## 2013-07-24 VITALS — BP 112/76 | HR 53 | Ht 72.0 in | Wt 180.8 lb

## 2013-07-24 DIAGNOSIS — R0789 Other chest pain: Secondary | ICD-10-CM

## 2013-07-24 DIAGNOSIS — R9431 Abnormal electrocardiogram [ECG] [EKG]: Secondary | ICD-10-CM | POA: Insufficient documentation

## 2013-07-24 DIAGNOSIS — F17200 Nicotine dependence, unspecified, uncomplicated: Secondary | ICD-10-CM

## 2013-07-24 DIAGNOSIS — Z01810 Encounter for preprocedural cardiovascular examination: Secondary | ICD-10-CM | POA: Insufficient documentation

## 2013-07-24 DIAGNOSIS — R079 Chest pain, unspecified: Secondary | ICD-10-CM

## 2013-07-24 NOTE — Patient Instructions (Addendum)
[facility] MYOVIEW  Your caregiver has ordered a Stress Test with nuclear imaging. The purpose of this test is to evaluate the blood supply to your heart muscle. This procedure is referred to as a "Non-Invasive Stress Test." This is because other than having an IV started in your vein, nothing is inserted or "invades" your body. Cardiac stress tests are done to find areas of poor blood flow to the heart by determining the extent of coronary artery disease (CAD). Some patients exercise on a treadmill, which naturally increases the blood flow to your heart, while others who are  unable to walk on a treadmill due to physical limitations have a pharmacologic/chemical stress agent called Lexiscan . This medicine will mimic walking on a treadmill by temporarily increasing your coronary blood flow.   Please note: these test may take anywhere between 2-4 hours to complete  PLEASE REPORT TO [facility] MEDICAL MALL ENTRANCE  THE VOLUNTEERS AT THE FIRST DESK WILL DIRECT YOU WHERE TO GO  Date of Procedure:_________Wednesday, Sept 17______________  Arrival Time for Procedure:________7:45______________________  Instructions regarding medication:    ____:  Hold other medications as follows:______HOLD CIALIS FOR 2 DAYS BEFORE PROCEDURE AND HOLD PROSCAR FOR 1 DAY________________________    PLEASE NOTIFY THE OFFICE AT LEAST 24 HOURS IN ADVANCE IF YOU ARE UNABLE TO KEEP YOUR APPOINTMENT.  [phone] AND  PLEASE NOTIFY NUCLEAR MEDICINE AT [facility] AT LEAST 24 HOURS IN ADVANCE IF YOU ARE UNABLE TO KEEP YOUR APPOINTMENT. [phone]  How to prepare for your Myoview test:  1. Do not eat or drink after midnight 2. No caffeine for 24 hours prior to test 3. No smoking 24 hours prior to test. 4. Your medication may be taken with water.  If your doctor stopped a medication because of this test, do not take that medication. 5. Ladies, please do not wear dresses.  Skirts or pants are appropriate. Please wear a short sleeve  shirt. 6. No perfume, cologne or lotion. 7. Wear comfortable walking shoes. No heels!  Call or return to clinic prn if these symptoms worsen or fail to improve as anticipated.

## 2013-07-24 NOTE — Assessment & Plan Note (Signed)
Dramatic changes in his EKG of the past several weeks. Concerning for ischemia

## 2013-07-24 NOTE — Assessment & Plan Note (Signed)
Stress test has been ordered. This will help guide his management and clearance for prostate surgery and possible gallbladder surgery

## 2013-07-24 NOTE — Assessment & Plan Note (Signed)
We have encouraged him to continue to work on weaning his cigarettes and smoking cessation. He will continue to work on this and does not want any assistance with chantix.  

## 2013-07-24 NOTE — Assessment & Plan Note (Signed)
He reports having chest pain over the past several months. Given his smoking history, male, abnormal EKG, we have scheduled him for stress test, pharmacologic study. Would not recommend routine treadmill given abnormal ST and T wave abnormality in V3 through V6.

## 2013-07-24 NOTE — Progress Notes (Signed)
Patient ID: Brian Dean, male    DOB: [birth year], 57 y.o.   MRN: [id number]  HPI Comments: Brian Dean is a very pleasant 57 year old gentleman with a history of diabetes, hypertension, long smoking history who continues to smoke with episodes of chest pain over the past 6 months, recent CT scan of his abdomen showing large gallstone, prostate issues who is scheduled to have a prostate procedure in the near future who presents for evaluation of his chest pain and abnormal EKG.  He reports having upper sternal chest pain radiating from right to left sometimes with rest, sometimes with exertion. Symptoms have been getting worse over the past 6 months. He felt it was musculoskeletal but he is not sure. Sometimes happens when he is walking or doing heavy exertion.  EKG in the hospital 07/07/2013 shows normal sinus rhythm with rate 88 beats per minute with left anterior fascicular block EKG today shows sinus bradycardia with rate 53 beats per minute, diffuse ST and T wave abnormality in V3 to V6, 2, 3, aVF  Notes indicate admission from August 30 to 07/09/2013. He was admitted for acute pyelonephritis, acute on chronic low back pain, hyponatremia, diabetes, hypertension  CT scan 07/07/2013 shows large calcified stone in the gallbladder neck, enlarged prostate He denies having any symptoms from his gallbladder stones    Outpatient Encounter Prescriptions as of 07/24/2013  Medication Sig Dispense Refill  . BAYER CONTOUR TEST test strip       . CINNAMON PO Take 1,000 mg by mouth 2 (two) times daily.      . finasteride (PROSCAR) 5 MG tablet Take 5 mg by mouth daily.      Marland Kitchen HYDROcodone-acetaminophen (NORCO) 5-325 MG per tablet Take 1 tablet by mouth every 6 (six) hours as needed.        . lidocaine (LIDODERM) 5 %       . lisinopril (PRINIVIL,ZESTRIL) 20 MG tablet Take 20 mg by mouth daily.      . metFORMIN (GLUCOPHAGE) 500 MG tablet Take 500 mg by mouth 3 (three) times daily.       Marland Kitchen MICROLET  LANCETS MISC       . omeprazole (PRILOSEC) 20 MG capsule Take 20 mg by mouth daily.        Marland Kitchen pyridOXINE (VITAMIN B-6) 100 MG tablet Take 100 mg by mouth daily.      . tadalafil (CIALIS) 5 MG tablet Take 5 mg by mouth daily as needed for erectile dysfunction.      Marland Kitchen aspirin 81 MG tablet Take 81 mg by mouth daily.        Review of Systems  Constitutional: Negative.   HENT: Negative.   Eyes: Negative.   Respiratory: Positive for chest tightness.   Cardiovascular: Positive for chest pain.  Gastrointestinal: Negative.   Endocrine: Negative.   Genitourinary: Positive for difficulty urinating.  Musculoskeletal: Negative.   Skin: Negative.   Allergic/Immunologic: Negative.   Neurological: Negative.   Hematological: Negative.   Psychiatric/Behavioral: Negative.   All other systems reviewed and are negative.    BP 112/76  Pulse 53  Ht 6' (1.829 m)  Wt 180 lb 12 oz (81.988 kg)  BMI 24.51 kg/m2  Physical Exam  Nursing note and vitals reviewed. Constitutional: He is oriented to person, place, and time. He appears well-developed and well-nourished.  HENT:  Head: Normocephalic.  Nose: Nose normal.  Mouth/Throat: Oropharynx is clear and moist.  Eyes: Conjunctivae are normal. Pupils are equal, round, and reactive to  light.  Neck: Normal range of motion. Neck supple. No JVD present.  Cardiovascular: Normal rate, regular rhythm, S1 normal, S2 normal, normal heart sounds and intact distal pulses.  Exam reveals no gallop and no friction rub.   No murmur heard. Pulmonary/Chest: Effort normal and breath sounds normal. No respiratory distress. He has no wheezes. He has no rales. He exhibits no tenderness.  Abdominal: Soft. Bowel sounds are normal. He exhibits no distension. There is no tenderness.  Musculoskeletal: Normal range of motion. He exhibits no edema and no tenderness.  Lymphadenopathy:    He has no cervical adenopathy.  Neurological: He is alert and oriented to person, place, and  time. Coordination normal.  Skin: Skin is warm and dry. No rash noted. No erythema.  Psychiatric: He has a normal mood and affect. His behavior is normal. Judgment and thought content normal.      Assessment and Plan

## 2013-07-26 ENCOUNTER — Ambulatory Visit: Payer: Self-pay | Admitting: Cardiovascular Disease

## 2013-07-26 DIAGNOSIS — R079 Chest pain, unspecified: Secondary | ICD-10-CM

## 2013-07-27 ENCOUNTER — Other Ambulatory Visit: Payer: Self-pay

## 2013-07-27 DIAGNOSIS — R9431 Abnormal electrocardiogram [ECG] [EKG]: Secondary | ICD-10-CM

## 2013-07-27 DIAGNOSIS — R0789 Other chest pain: Secondary | ICD-10-CM

## 2013-07-28 ENCOUNTER — Telehealth: Payer: Self-pay

## 2013-07-28 ENCOUNTER — Ambulatory Visit (INDEPENDENT_AMBULATORY_CARE_PROVIDER_SITE_OTHER): Payer: 59 | Admitting: Cardiovascular Disease

## 2013-07-28 ENCOUNTER — Encounter: Payer: Self-pay | Admitting: Cardiovascular Disease

## 2013-07-28 VITALS — BP 138/78 | HR 62 | Wt 178.1 lb

## 2013-07-28 DIAGNOSIS — R079 Chest pain, unspecified: Secondary | ICD-10-CM

## 2013-07-28 DIAGNOSIS — F17200 Nicotine dependence, unspecified, uncomplicated: Secondary | ICD-10-CM

## 2013-07-28 DIAGNOSIS — E119 Type 2 diabetes mellitus without complications: Secondary | ICD-10-CM

## 2013-07-28 DIAGNOSIS — I2510 Atherosclerotic heart disease of native coronary artery without angina pectoris: Secondary | ICD-10-CM

## 2013-07-28 MED ORDER — ATORVASTATIN CALCIUM 10 MG PO TABS: 10.0000 mg | ORAL_TABLET | Freq: Every day | ORAL | Status: DC

## 2013-07-28 MED ORDER — NITROGLYCERIN 0.4 MG SL SUBL: 0.4000 mg | SUBLINGUAL_TABLET | SUBLINGUAL | Status: DC | PRN

## 2013-07-28 NOTE — Telephone Encounter (Signed)
Pt seen in office today to discuss results and possible cath with Dr. Mariah Milling.

## 2013-07-28 NOTE — Assessment & Plan Note (Signed)
We have encouraged continued exercise, careful diet management in an effort to lose weight. 

## 2013-07-28 NOTE — Telephone Encounter (Signed)
Message copied by Marilynne Halsted on Fri Jul 28, 2013 11:42 AM ------      Message from: Antonieta Iba      Created: Thu Jul 27, 2013 10:45 PM       Needs appt to discuss results of stress test ------

## 2013-07-28 NOTE — Assessment & Plan Note (Addendum)
Images suggests occluded RCA with peri-infarct ischemia. Currently with rare symptoms of chest pain. Uncertain if this is angina. Encouraged him to take aspirin, statin ACE inhibitor. We'll hold off on beta blocker given bradycardia. We have recommended cardiac catheterization given a stress test findings . He'll discuss this with his wife who is present with him today . He we'll start Lipitor 10 mg daily.

## 2013-07-28 NOTE — Assessment & Plan Note (Signed)
We have encouraged him to continue to work on weaning his cigarettes and smoking cessation. He will continue to work on this and does not want any assistance with chantix.  

## 2013-07-28 NOTE — Assessment & Plan Note (Signed)
Long discussion about his chest pain symptoms again, the meaning of the stress test, whether to proceed with cardiac catheterization. He has indicated that he would like to have his prostate treated first before any catheterization.

## 2013-07-28 NOTE — Progress Notes (Signed)
Patient ID: Brian Dean, male    DOB: 02-02-1956, 57 y.o.   MRN: [id number]  HPI Comments: Mr. [name] is a very pleasant 57 year old gentleman with a history of diabetes, hypertension, long smoking history who continues to smoke with episodes of chest pain over the past 6 months, recent CT scan of his abdomen showing large gallstone, prostate issues who is scheduled to have a prostate procedure in the near future who presents for followup after recent stress test.  Stress Myoview shows what appears to be a proximally occluded RCA with peri-infarct ischemia.  Images were shown to him. Long discussion with his wife about the meaning of the images. Left anterior descending and left circumflex territory appear intact with no perfusion abnormality. Ejection fraction 47% with wall motion abnormality in the inferior wall.   He continues to have rare episodes of chest tightness. He is uncertain if this is muscle or something else. Sometimes seems to happen at rest, sometimes with exertion.  EKG in the hospital 07/07/2013 shows normal sinus rhythm with rate 88 beats per minute with left anterior fascicular block  Notes indicate admission from August 30 to 07/09/2013. He was admitted for acute pyelonephritis, acute on chronic low back pain, hyponatremia, diabetes, hypertension  CT scan 07/07/2013 shows large calcified stone in the gallbladder neck, enlarged prostate He denies having any symptoms from his gallbladder stones    Outpatient Encounter Prescriptions as of 07/28/2013  Medication Sig Dispense Refill  . aspirin 81 MG tablet Take 81 mg by mouth daily.      Marland Kitchen atorvastatin (LIPITOR) 10 MG tablet Take 1 tablet (10 mg total) by mouth daily.  30 tablet  11  . BAYER CONTOUR TEST test strip       . CINNAMON PO Take 1,000 mg by mouth 2 (two) times daily.      . finasteride (PROSCAR) 5 MG tablet Take 5 mg by mouth daily.      Marland Kitchen HYDROcodone-acetaminophen (NORCO) 5-325 MG per tablet Take 1 tablet  by mouth every 6 (six) hours as needed.        . lidocaine (LIDODERM) 5 %       . lisinopril (PRINIVIL,ZESTRIL) 20 MG tablet Take 20 mg by mouth daily.      . metFORMIN (GLUCOPHAGE) 500 MG tablet Take 500 mg by mouth 3 (three) times daily.       Marland Kitchen MICROLET LANCETS MISC       . nitroGLYCERIN (NITROSTAT) 0.4 MG SL tablet Place 1 tablet (0.4 mg total) under the tongue every 5 (five) minutes as needed.  25 tablet  3  . omeprazole (PRILOSEC) 20 MG capsule Take 20 mg by mouth daily.        Marland Kitchen pyridOXINE (VITAMIN B-6) 100 MG tablet Take 100 mg by mouth daily.      . tadalafil (CIALIS) 5 MG tablet Take 5 mg by mouth daily as needed for erectile dysfunction.       No facility-administered encounter medications on file as of 07/28/2013.     Review of Systems  Constitutional: Negative.   HENT: Negative.   Eyes: Negative.   Respiratory: Positive for chest tightness.   Gastrointestinal: Negative.   Endocrine: Negative.   Musculoskeletal: Negative.   Skin: Negative.   Allergic/Immunologic: Negative.   Neurological: Negative.   Hematological: Negative.   Psychiatric/Behavioral: Negative.   All other systems reviewed and are negative.    BP 138/78  Pulse 62  Wt 178 lb 1.9 oz (80.795 kg)  BMI 24.15 kg/m2  Physical Exam  Nursing note and vitals reviewed. Constitutional: He is oriented to person, place, and time. He appears well-developed and well-nourished.  HENT:  Head: Normocephalic.  Nose: Nose normal.  Mouth/Throat: Oropharynx is clear and moist.  Eyes: Conjunctivae are normal. Pupils are equal, round, and reactive to light.  Neck: Normal range of motion. Neck supple. No JVD present.  Cardiovascular: Normal rate, regular rhythm, S1 normal, S2 normal, normal heart sounds and intact distal pulses.  Exam reveals no gallop and no friction rub.   No murmur heard. Pulmonary/Chest: Effort normal and breath sounds normal. No respiratory distress. He has no wheezes. He has no rales. He exhibits  no tenderness.  Abdominal: Soft. Bowel sounds are normal. He exhibits no distension. There is no tenderness.  Musculoskeletal: Normal range of motion. He exhibits no edema and no tenderness.  Lymphadenopathy:    He has no cervical adenopathy.  Neurological: He is alert and oriented to person, place, and time. Coordination normal.  Skin: Skin is warm and dry. No rash noted. No erythema.  Psychiatric: He has a normal mood and affect. His behavior is normal. Judgment and thought content normal.      Assessment and Plan

## 2013-07-28 NOTE — Patient Instructions (Addendum)
You are doing well. No medication changes were made.  Please call for chest pain Call the office if you would like to set up a cardiac cath Please start Lipitor 10 mg daily  Please call us if you have new issues that need to be addressed before your next appt.  Your physician wants you to follow-up in: 6 months.  You will receive a reminder letter in the mail two months in advance. If you don't receive a letter, please call our office to schedule the follow-up appointment.

## 2013-09-05 ENCOUNTER — Telehealth: Payer: Self-pay

## 2013-09-05 DIAGNOSIS — Z01818 Encounter for other preprocedural examination: Secondary | ICD-10-CM

## 2013-09-05 DIAGNOSIS — R079 Chest pain, unspecified: Secondary | ICD-10-CM

## 2013-09-05 NOTE — Telephone Encounter (Signed)
Spoke w/ pt.   Cath sched for 09/15/13 @ 6:30. Pt to come in tomorrow for pre-procedure labs and chest x-ray.

## 2013-09-05 NOTE — Telephone Encounter (Signed)
Pt called requesting that we set up cardiac cath. He does not have a particular day in mind, but would like it "as soon it is convenient for y'all" Informed him that I will speak with Dr. Mariah Milling and call him back with day, time and instructions. Pt is aware that he will need to come in for labs and chest x-ray.

## 2013-09-06 ENCOUNTER — Ambulatory Visit: Payer: Self-pay | Admitting: Cardiovascular Disease

## 2013-09-06 ENCOUNTER — Telehealth: Payer: Self-pay

## 2013-09-06 ENCOUNTER — Other Ambulatory Visit: Payer: 59

## 2013-09-06 DIAGNOSIS — Z01818 Encounter for other preprocedural examination: Secondary | ICD-10-CM

## 2013-09-06 DIAGNOSIS — R079 Chest pain, unspecified: Secondary | ICD-10-CM

## 2013-09-06 NOTE — Telephone Encounter (Signed)
Pt sched for cardiac cath 09/15/13. States that Dr. Mariah Milling had mentioned "something about aspirin or plavix" Pt has been off of asa since 9/21 and does not take plavix.  Pt would like to know if he needs to be on an antiplatelet, esp before his upcoming cath.

## 2013-09-06 NOTE — Telephone Encounter (Signed)
Would definitely restart aspirin 81 mg daily If he would like, would  start Plavix 75 mg daily

## 2013-09-06 NOTE — Telephone Encounter (Signed)
Left detailed message on pt's voice mail

## 2013-09-07 ENCOUNTER — Other Ambulatory Visit: Payer: Self-pay

## 2013-09-07 DIAGNOSIS — R079 Chest pain, unspecified: Secondary | ICD-10-CM

## 2013-09-07 DIAGNOSIS — Z01818 Encounter for other preprocedural examination: Secondary | ICD-10-CM

## 2013-09-07 LAB — BASIC METABOLIC PANEL
GFR calc Af Amer: 104 mL/min/{1.73_m2} (ref >59)
GFR calc non Af Amer: 90 mL/min/{1.73_m2} (ref >59)
Potassium: 4.6 mmol/L (ref 3.5–5.2)
Sodium: 137 mmol/L (ref 134–144)

## 2013-09-07 LAB — CBC WITH DIFFERENTIAL/PLATELET
Basos: 1 %
Eosinophils Absolute: 0.1 10*3/uL (ref 0.0–0.4)
Hemoglobin: 12.9 g/dL (ref 12.6–17.7)
MCH: 29.9 pg (ref 26.6–33.0)
MCV: 91 fL (ref 79–97)
Monocytes Absolute: 0.6 10*3/uL (ref 0.1–0.9)
Neutrophils Absolute: 3.1 10*3/uL (ref 1.4–7.0)
RBC: 4.32 x10E6/uL (ref 4.14–5.80)

## 2013-09-09 DIAGNOSIS — I2510 Atherosclerotic heart disease of native coronary artery without angina pectoris: Secondary | ICD-10-CM

## 2013-09-09 HISTORY — PX: CARDIAC CATHETERIZATION: SHX172

## 2013-09-09 HISTORY — DX: Atherosclerotic heart disease of native coronary artery without angina pectoris: I25.10

## 2013-09-15 ENCOUNTER — Encounter: Payer: Self-pay | Admitting: Cardiovascular Disease

## 2013-09-15 ENCOUNTER — Ambulatory Visit: Payer: Self-pay | Admitting: Cardiovascular Disease

## 2013-09-15 DIAGNOSIS — I2510 Atherosclerotic heart disease of native coronary artery without angina pectoris: Secondary | ICD-10-CM

## 2013-09-16 ENCOUNTER — Other Ambulatory Visit: Payer: Self-pay | Admitting: Cardiovascular Disease

## 2013-09-16 DIAGNOSIS — I2510 Atherosclerotic heart disease of native coronary artery without angina pectoris: Secondary | ICD-10-CM

## 2013-09-16 LAB — BASIC METABOLIC PANEL
BUN: 11 mg/dL (ref 7–18)
Calcium, Total: 9 mg/dL (ref 8.5–10.1)
Creatinine: 0.76 mg/dL (ref 0.60–1.30)
EGFR (African American): >60
Osmolality: 276 (ref 275–301)
Sodium: 137 mmol/L (ref 136–145)

## 2013-09-16 LAB — CK-MB
CK-MB: 3.2 ng/mL (ref 0.5–3.6)
CK-MB: 3 ng/mL (ref 0.5–3.6)
CK-MB: 2.5 ng/mL (ref 0.5–3.6)

## 2013-09-16 LAB — TROPONIN I: Troponin-I: 0.28 ng/mL — ABNORMAL HIGH

## 2013-09-16 LAB — CK TOTAL AND CKMB (NOT AT ARMC)
CK, Total: 113 U/L (ref 35–232)
CK-MB: 3.1 ng/mL (ref 0.5–3.6)

## 2013-09-16 MED ORDER — CLOPIDOGREL BISULFATE 75 MG PO TABS: 75.0000 mg | ORAL_TABLET | Freq: Every day | ORAL | Status: DC

## 2013-09-16 MED ORDER — PANTOPRAZOLE SODIUM 40 MG PO TBEC: 40.0000 mg | DELAYED_RELEASE_TABLET | Freq: Every day | ORAL | Status: DC

## 2013-09-18 ENCOUNTER — Encounter: Payer: Self-pay | Admitting: Cardiovascular Disease

## 2013-09-21 ENCOUNTER — Telehealth: Payer: Self-pay | Admitting: *Deleted

## 2013-09-21 NOTE — Telephone Encounter (Signed)
Patient had a cath and stent last week and wants to know if he can have a routine cleaning today at 4:00. Please advise.

## 2013-09-25 ENCOUNTER — Ambulatory Visit (INDEPENDENT_AMBULATORY_CARE_PROVIDER_SITE_OTHER): Payer: 59 | Admitting: Cardiovascular Disease

## 2013-09-25 ENCOUNTER — Encounter: Payer: Self-pay | Admitting: Cardiovascular Disease

## 2013-09-25 VITALS — BP 128/72 | HR 57 | Ht 72.0 in | Wt 184.0 lb

## 2013-09-25 DIAGNOSIS — E119 Type 2 diabetes mellitus without complications: Secondary | ICD-10-CM

## 2013-09-25 DIAGNOSIS — I2510 Atherosclerotic heart disease of native coronary artery without angina pectoris: Secondary | ICD-10-CM

## 2013-09-25 DIAGNOSIS — Z955 Presence of coronary angioplasty implant and graft: Secondary | ICD-10-CM | POA: Insufficient documentation

## 2013-09-25 NOTE — Assessment & Plan Note (Signed)
We have encouraged continued exercise, careful diet management in an effort to lose weight. 

## 2013-09-25 NOTE — Assessment & Plan Note (Signed)
Currently with no symptoms of angina. No further workup at this time. Continue current medication regimen. 

## 2013-09-25 NOTE — Assessment & Plan Note (Signed)
DES placed to his proximal left circumflex. We'll continue aspirin 81 mg x2 with Plavix. Encouraged him to stop smoking as he has done

## 2013-09-25 NOTE — Progress Notes (Signed)
Patient ID: Brian Dean, male    DOB: 03/15/1956, 57 y.o.   MRN: [id number]  HPI Comments: [name] is a very pleasant 57 year old gentleman with a history of diabetes, hypertension, long smoking history ,  c previous episodes of chest pain , CT scan of his abdomen showing large gallstone, prostate issues who is scheduled to have a prostate procedure,  Positive stress test, who underwent cardiac catheterization who presents for routine followup.  Stress Myoview shows what appears to be a proximally occluded RCA with peri-infarct ischemia.  Images were shown to him. Long discussion with his wife about the meaning of the images. Left anterior descending and left circumflex territory appear intact with no perfusion abnormality. Ejection fraction 47% with wall motion abnormality in the inferior wall.   He continued to have rare episodes of chest tightness.  He underwent cardiac catheterization 2 weeks ago that showed severely diseased proximal left circumflex disease, sequential 50% lesions in his LAD, 40% RCA disease. He had DES placed to his proximal left circumflex.   In followup today, he feels well. He stopped smoking several weeks ago. Overall he has no complaints. Denies any significant chest pain or shortness of breath.  EKG in the hospital 07/07/2013 shows normal sinus rhythm with rate 88 beats per minute with left anterior fascicular block  Notes indicate admission from August 30 to 07/09/2013. He was admitted for acute pyelonephritis, acute on chronic low back pain, hyponatremia, diabetes, hypertension  CT scan 07/07/2013 shows large calcified stone in the gallbladder neck, enlarged prostate He denies having any symptoms from his gallbladder stones  EKG today shows normal sinus rhythm with rate 57 beats per minute, left anterior fascicular block, nonspecific ST abnormality, improved from prior EKG    Outpatient Encounter Prescriptions as of 09/25/2013  Medication Sig  .  aspirin 81 MG tablet Take 81 mg by mouth daily.  Marland Kitchen atorvastatin (LIPITOR) 10 MG tablet Take 1 tablet (10 mg total) by mouth daily.  Marland Kitchen BAYER CONTOUR TEST test strip   . CINNAMON PO Take 1,000 mg by mouth 2 (two) times daily.  . clopidogrel (PLAVIX) 75 MG tablet Take 1 tablet (75 mg total) by mouth daily.  . finasteride (PROSCAR) 5 MG tablet Take 5 mg by mouth daily.  Marland Kitchen HYDROcodone-acetaminophen (NORCO) 5-325 MG per tablet Take 1 tablet by mouth every 6 (six) hours as needed.    . lidocaine (LIDODERM) 5 %   . lisinopril (PRINIVIL,ZESTRIL) 20 MG tablet Take 20 mg by mouth daily.  . metFORMIN (GLUCOPHAGE) 500 MG tablet Take 500 mg by mouth 3 (three) times daily.   Marland Kitchen MICROLET LANCETS MISC   . nitroGLYCERIN (NITROSTAT) 0.4 MG SL tablet Place 1 tablet (0.4 mg total) under the tongue every 5 (five) minutes as needed.  . pantoprazole (PROTONIX) 40 MG tablet Take 1 tablet (40 mg total) by mouth daily.  Marland Kitchen pyridOXINE (VITAMIN B-6) 100 MG tablet Take 100 mg by mouth daily.  . tadalafil (CIALIS) 5 MG tablet Take 5 mg by mouth daily as needed for erectile dysfunction.     Review of Systems  Constitutional: Negative.   HENT: Negative.   Eyes: Negative.   Cardiovascular: Negative.   Gastrointestinal: Negative.   Endocrine: Negative.   Musculoskeletal: Negative.   Skin: Negative.   Allergic/Immunologic: Negative.   Neurological: Negative.   Hematological: Negative.   Psychiatric/Behavioral: Negative.   All other systems reviewed and are negative.    BP 128/72  Pulse 57  Ht 6' (1.829  m)  Wt 184 lb (83.462 kg)  BMI 24.95 kg/m2  Physical Exam  Nursing note and vitals reviewed. Constitutional: He is oriented to person, place, and time. He appears well-developed and well-nourished.  HENT:  Head: Normocephalic.  Nose: Nose normal.  Mouth/Throat: Oropharynx is clear and moist.  Eyes: Conjunctivae are normal. Pupils are equal, round, and reactive to light.  Neck: Normal range of motion. Neck  supple. No JVD present.  Cardiovascular: Normal rate, regular rhythm, S1 normal, S2 normal, normal heart sounds and intact distal pulses.  Exam reveals no gallop and no friction rub.   No murmur heard. Pulmonary/Chest: Effort normal and breath sounds normal. No respiratory distress. He has no wheezes. He has no rales. He exhibits no tenderness.  Abdominal: Soft. Bowel sounds are normal. He exhibits no distension. There is no tenderness.  Musculoskeletal: Normal range of motion. He exhibits no edema and no tenderness.  Lymphadenopathy:    He has no cervical adenopathy.  Neurological: He is alert and oriented to person, place, and time. Coordination normal.  Skin: Skin is warm and dry. No rash noted. No erythema.  Psychiatric: He has a normal mood and affect. His behavior is normal. Judgment and thought content normal.      Assessment and Plan

## 2013-09-25 NOTE — Patient Instructions (Addendum)
You are doing well. No medication changes were made.  Please increase aspirin 81 mg up to 2 a day with plavix  Goal total cholesterol <150, LDL <70  Please call us if you have new issues that need to be addressed before your next appt.  Your physician wants you to follow-up in: 6 months.  You will receive a reminder letter in the mail two months in advance. If you don't receive a letter, please call our office to schedule the follow-up appointment.

## 2013-10-12 ENCOUNTER — Telehealth: Payer: Self-pay

## 2013-10-12 NOTE — Telephone Encounter (Signed)
Pt left message on voicemail requesting paperwork be completed that he dropped off on 11/17 visit.  Requests that it be sent as soon as possible as he needs to be paid for his time out of work.

## 2013-10-13 NOTE — Telephone Encounter (Signed)
Spoke w/ pt.  Informed him that I would fax letter and mail original to [facility] Attn:  Med Dept  [phone] [place] [id number]

## 2014-02-22 ENCOUNTER — Telehealth: Payer: Self-pay | Admitting: *Deleted

## 2014-02-22 NOTE — Telephone Encounter (Signed)
Per fax from [facility] pt is not interested in rehab because he did not return any messages

## 2014-04-08 ENCOUNTER — Other Ambulatory Visit: Payer: Self-pay | Admitting: Cardiovascular Disease

## 2014-05-21 ENCOUNTER — Ambulatory Visit (INDEPENDENT_AMBULATORY_CARE_PROVIDER_SITE_OTHER): Payer: 59 | Admitting: Cardiovascular Disease

## 2014-05-21 ENCOUNTER — Encounter: Payer: Self-pay | Admitting: Cardiovascular Disease

## 2014-05-21 VITALS — BP 130/80 | HR 56 | Ht 72.0 in | Wt 188.0 lb

## 2014-05-21 DIAGNOSIS — E119 Type 2 diabetes mellitus without complications: Secondary | ICD-10-CM

## 2014-05-21 DIAGNOSIS — Z9861 Coronary angioplasty status: Secondary | ICD-10-CM

## 2014-05-21 DIAGNOSIS — I2510 Atherosclerotic heart disease of native coronary artery without angina pectoris: Secondary | ICD-10-CM

## 2014-05-21 DIAGNOSIS — I209 Angina pectoris, unspecified: Secondary | ICD-10-CM

## 2014-05-21 DIAGNOSIS — F17200 Nicotine dependence, unspecified, uncomplicated: Secondary | ICD-10-CM

## 2014-05-21 DIAGNOSIS — Z955 Presence of coronary angioplasty implant and graft: Secondary | ICD-10-CM

## 2014-05-21 DIAGNOSIS — I25119 Atherosclerotic heart disease of native coronary artery with unspecified angina pectoris: Secondary | ICD-10-CM

## 2014-05-21 NOTE — Assessment & Plan Note (Signed)
Currently with no symptoms of angina. No further workup at this time. Continue current medication regimen. 

## 2014-05-21 NOTE — Patient Instructions (Signed)
You are doing well. No medication changes were made.  Please call us if you have new issues that need to be addressed before your next appt.  Your physician wants you to follow-up in: 12 months.  You will receive a reminder letter in the mail two months in advance. If you don't receive a letter, please call our office to schedule the follow-up appointment. 

## 2014-05-21 NOTE — Assessment & Plan Note (Signed)
Previous stent to his circumflex. We'll stay on aspirin and Plavix

## 2014-05-21 NOTE — Assessment & Plan Note (Signed)
We have encouraged continued exercise, careful diet management in an effort to lose weight. 

## 2014-05-21 NOTE — Progress Notes (Signed)
Patient ID: Brian Dean, male    DOB: 04-Nov-1956, 58 y.o.   MRN: [id number]  HPI Comments: [name] is a very pleasant 58 year old gentleman with a history of diabetes, hypertension, long smoking history ,  previous episodes of chest pain , CT scan of his abdomen showing large gallstone, prostate issues who is scheduled to have a prostate procedure,  Positive stress test, who underwent cardiac catheterization who presents for routine followup. Cardiac catheterization showed severe proximal left circumflex disease, 50% lesions in his LAD, 40% RCA disease. Stent placed to his proximal left circumflex  In followup today, he reports having no chest pain. Prior anginal symptoms was upper bilateral chest pain. He is active, stopped smoking 2 weeks ago. He feels well with no complaints. Does not do a regular exercise program.  Previous Stress Myoview showed what appears to be a proximally occluded RCA with peri-infarct ischemia.   Left anterior descending and left circumflex territory appear intact with no perfusion abnormality. Ejection fraction 47% with wall motion abnormality in the inferior wall.  Cardiac catheterization performed as above   In followup today, he feels well. He stopped smoking several weeks ago. Overall he has no complaints. Denies any significant chest pain or shortness of breath.  Notes indicate admission from August 30 to 07/09/2013. He was admitted for acute pyelonephritis, acute on chronic low back pain, hyponatremia, diabetes, hypertension  CT scan 07/07/2013 shows large calcified stone in the gallbladder neck, enlarged prostate He denies having any symptoms from his gallbladder stones Recent lab work showing total cholesterol 144, LDL 80, HDL 39  EKG today shows normal sinus rhythm with rate 56 beats per minute, left anterior fascicular block, nonspecific ST abnormality    Outpatient Encounter Prescriptions as of 05/21/2014  Medication Sig  . aspirin 81 MG  tablet Take 162 mg by mouth daily.   Marland Kitchen atorvastatin (LIPITOR) 10 MG tablet Take 1 tablet (10 mg total) by mouth daily.  Marland Kitchen CINNAMON PO Take 1,000 mg by mouth 2 (two) times daily.  . clopidogrel (PLAVIX) 75 MG tablet Take 1 tablet (75 mg total) by mouth daily.  . finasteride (PROSCAR) 5 MG tablet Take 5 mg by mouth daily.  Marland Kitchen HYDROcodone-acetaminophen (NORCO) 5-325 MG per tablet Take 1 tablet by mouth every 6 (six) hours as needed.    . lidocaine (LIDODERM) 5 %   . lisinopril (PRINIVIL,ZESTRIL) 20 MG tablet Take 20 mg by mouth daily.  . metFORMIN (GLUCOPHAGE) 500 MG tablet Take 500 mg by mouth 3 (three) times daily.   . nitroGLYCERIN (NITROSTAT) 0.4 MG SL tablet Place 1 tablet (0.4 mg total) under the tongue every 5 (five) minutes as needed.  . pantoprazole (PROTONIX) 40 MG tablet Take 1 tablet (40 mg total) by mouth daily.  Marland Kitchen pyridOXINE (VITAMIN B-6) 100 MG tablet Take 100 mg by mouth daily.  . tadalafil (CIALIS) 5 MG tablet Take 5 mg by mouth daily as needed for erectile dysfunction.    Review of Systems  Constitutional: Negative.   HENT: Negative.   Eyes: Negative.   Respiratory: Negative.   Cardiovascular: Negative.   Gastrointestinal: Negative.   Endocrine: Negative.   Musculoskeletal: Negative.   Skin: Negative.   Allergic/Immunologic: Negative.   Neurological: Negative.   Hematological: Negative.   Psychiatric/Behavioral: Negative.   All other systems reviewed and are negative.   BP 130/80  Pulse 56  Ht 6' (1.829 m)  Wt 188 lb (85.276 kg)  BMI 25.49 kg/m2  Physical Exam  Nursing note  and vitals reviewed. Constitutional: He is oriented to person, place, and time. He appears well-developed and well-nourished.  HENT:  Head: Normocephalic.  Nose: Nose normal.  Mouth/Throat: Oropharynx is clear and moist.  Eyes: Conjunctivae are normal. Pupils are equal, round, and reactive to light.  Neck: Normal range of motion. Neck supple. No JVD present.  Cardiovascular: Normal  rate, regular rhythm, S1 normal, S2 normal, normal heart sounds and intact distal pulses.  Exam reveals no gallop and no friction rub.   No murmur heard. Pulmonary/Chest: Effort normal and breath sounds normal. No respiratory distress. He has no wheezes. He has no rales. He exhibits no tenderness.  Abdominal: Soft. Bowel sounds are normal. He exhibits no distension. There is no tenderness.  Musculoskeletal: Normal range of motion. He exhibits no edema and no tenderness.  Lymphadenopathy:    He has no cervical adenopathy.  Neurological: He is alert and oriented to person, place, and time. Coordination normal.  Skin: Skin is warm and dry. No rash noted. No erythema.  Psychiatric: He has a normal mood and affect. His behavior is normal. Judgment and thought content normal.      Assessment and Plan

## 2014-05-21 NOTE — Assessment & Plan Note (Signed)
Recently stopped smoking 2 weeks ago. Encouraged him to continue his vapor cigarette

## 2014-05-22 ENCOUNTER — Encounter: Payer: Self-pay | Admitting: Cardiovascular Disease

## 2014-06-07 ENCOUNTER — Telehealth: Payer: Self-pay

## 2014-06-07 NOTE — Telephone Encounter (Signed)
Faxed Blood Thinner Info Request to [facility] at [phone] for pt to proceed w/ colonoscopy and EGD on 07/24/14.  Per Christell Faith, PA: "Pt cannot stop blood thinner due to medical necessity  "pt must stay on Plavix & aspirin x 1 yr post DES (09/15/2013)".

## 2014-07-31 ENCOUNTER — Other Ambulatory Visit: Payer: Self-pay | Admitting: *Deleted

## 2014-07-31 MED ORDER — ATORVASTATIN CALCIUM 10 MG PO TABS: 10.0000 mg | ORAL_TABLET | Freq: Every day | ORAL | Status: DC

## 2014-07-31 NOTE — Telephone Encounter (Signed)
Requested Prescriptions   Signed Prescriptions Disp Refills  . atorvastatin (LIPITOR) 10 MG tablet 30 tablet 3    Sig: Take 1 tablet (10 mg total) by mouth daily.    Authorizing Provider: Minna Merritts    Ordering User: Britt Bottom

## 2014-08-06 ENCOUNTER — Other Ambulatory Visit: Payer: Self-pay

## 2014-08-06 MED ORDER — CLOPIDOGREL BISULFATE 75 MG PO TABS: 75.0000 mg | ORAL_TABLET | Freq: Every day | ORAL | Status: DC

## 2014-08-06 NOTE — Telephone Encounter (Signed)
Refill sent for plavix  

## 2014-08-24 ENCOUNTER — Other Ambulatory Visit: Payer: Self-pay

## 2014-09-25 ENCOUNTER — Telehealth: Payer: Self-pay | Admitting: Cardiovascular Disease

## 2014-09-25 NOTE — Telephone Encounter (Signed)
Amy from [facility], called letting us know pt bp was 171/78 and heart rate this morning was 46. Just letting us know for in case we wanted to change meds for pt. Please call pt.

## 2014-09-25 NOTE — Telephone Encounter (Signed)
Left message for pt to call back and let us know if he has been monitoring BP and if elevation was one time occurrence.

## 2014-09-27 NOTE — Telephone Encounter (Signed)
Spoke w/ pt's wife.  She reports that pt's BO was initially fine at MD visit, but was elevated after flu shot.  Advised her to monitor pt's BP and call us if it is consistently elevated.  She verbalizes understanding and will call back w/ any further questions or concerns.

## 2014-10-19 ENCOUNTER — Other Ambulatory Visit: Payer: Self-pay

## 2014-10-19 MED ORDER — ATORVASTATIN CALCIUM 10 MG PO TABS: 10.0000 mg | ORAL_TABLET | Freq: Every day | ORAL | Status: DC

## 2014-10-19 NOTE — Telephone Encounter (Signed)
Refill sent for atorvastatin 10 mg  

## 2014-11-19 ENCOUNTER — Other Ambulatory Visit: Payer: Self-pay | Admitting: *Deleted

## 2014-11-19 MED ORDER — CLOPIDOGREL BISULFATE 75 MG PO TABS: 75.0000 mg | ORAL_TABLET | Freq: Every day | ORAL | Status: DC

## 2014-11-19 MED ORDER — ATORVASTATIN CALCIUM 10 MG PO TABS: 10.0000 mg | ORAL_TABLET | Freq: Every day | ORAL | Status: DC

## 2014-12-19 LAB — HM DIABETES EYE EXAM

## 2014-12-19 IMAGING — CT CT ABD-PELV W/O CM
1 of 2 series · 14 of 32 positions shown, 18 images · non-contrast
Comparison: none

REASON FOR EXAM: (1) hematuria, low back pain; (2) hematuria, low back
pain
COMMENTS:   May transport without cardiac monitor

[Series 2: 3mm soft tissue · axial · 0.77mm/px · z∈[+37,+475]mm · 14 of 160 slices shown, 18 images]
[im 7/160  soft-tissue]
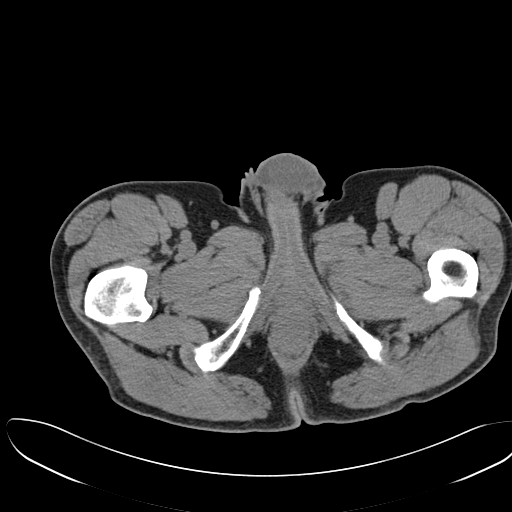
[im 7/160  bone]
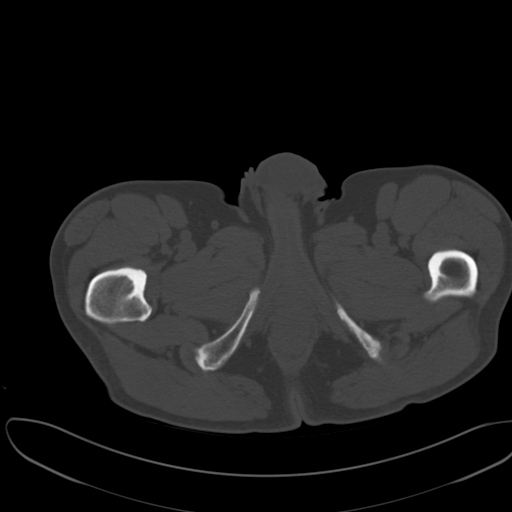
[im 20/160  soft-tissue]
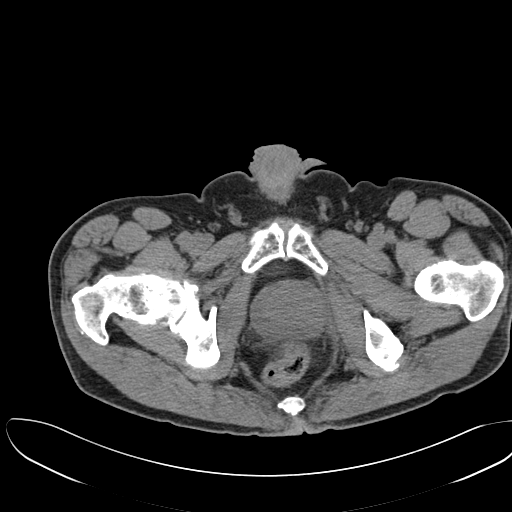
[im 34/160  soft-tissue]
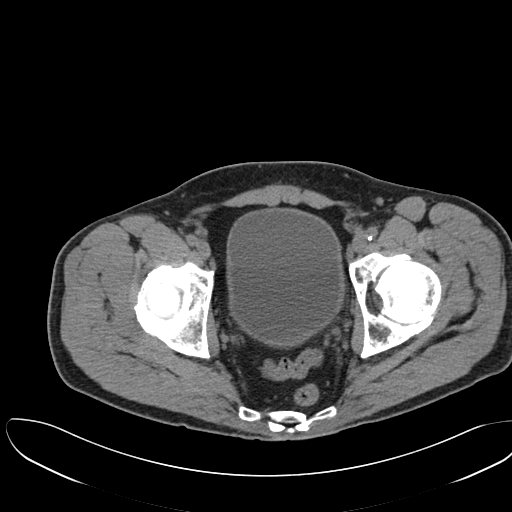
[im 47/160  soft-tissue]
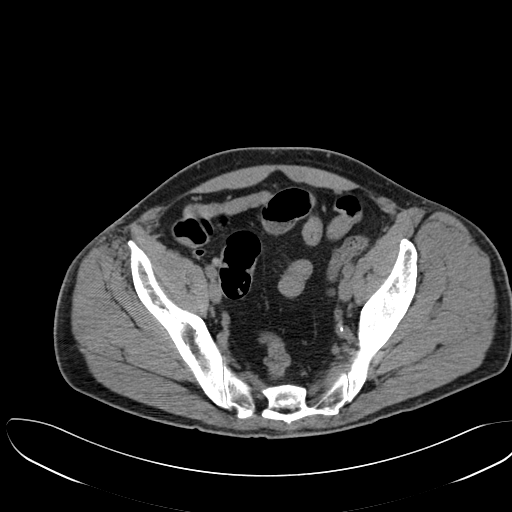
[im 60/160  soft-tissue]
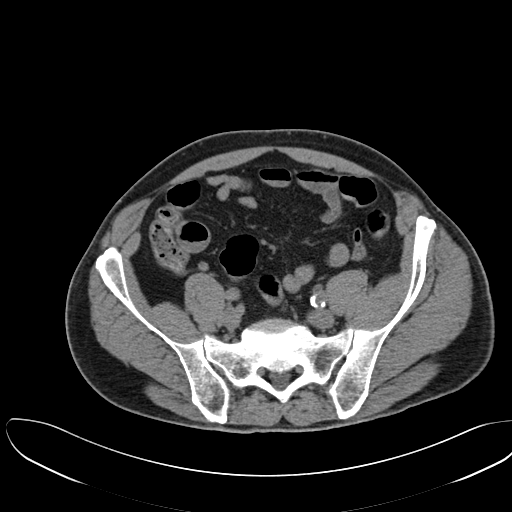
[im 73/160  soft-tissue]
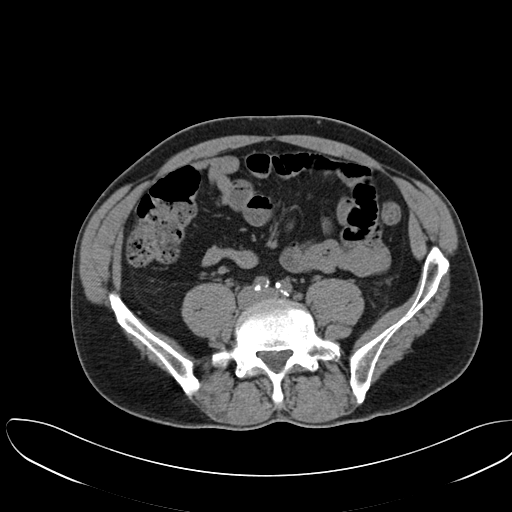
[im 87/160  soft-tissue]
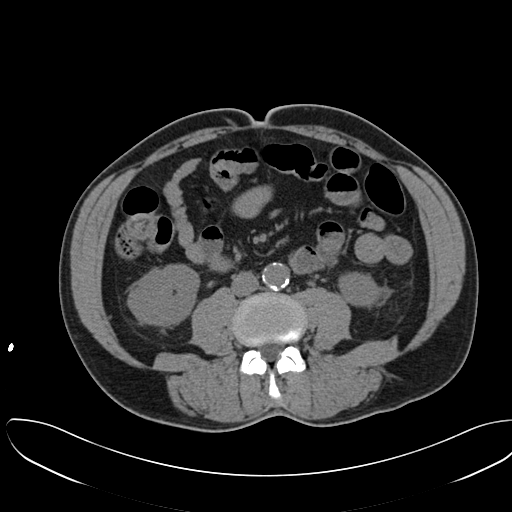
[im 100/160  soft-tissue]
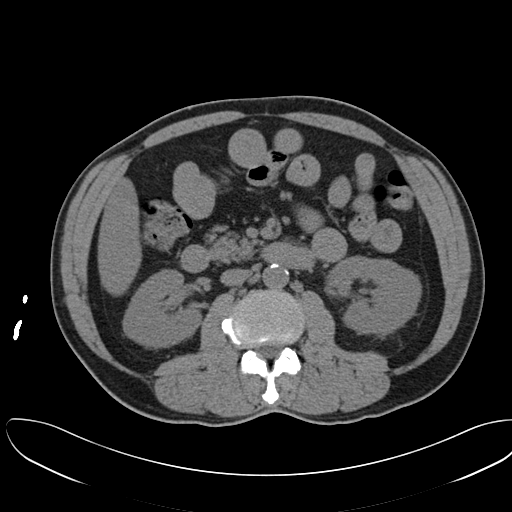
[im 113/160  soft-tissue]
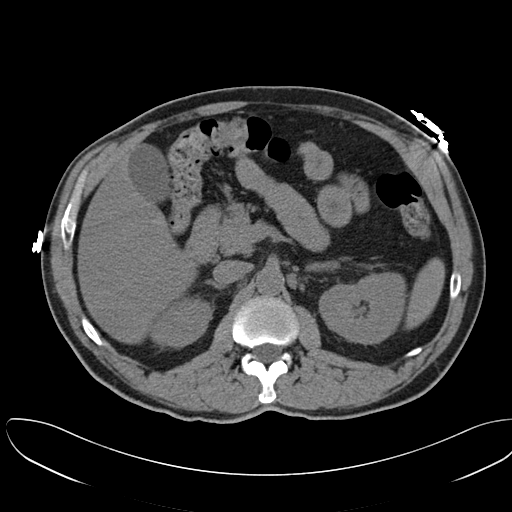
[im 113/160  bone]
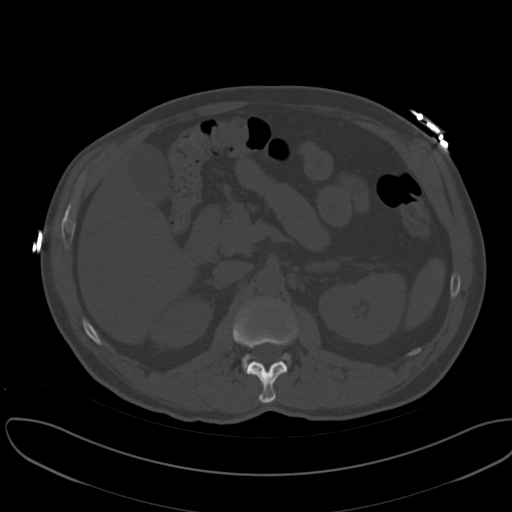
[im 126/160  soft-tissue]
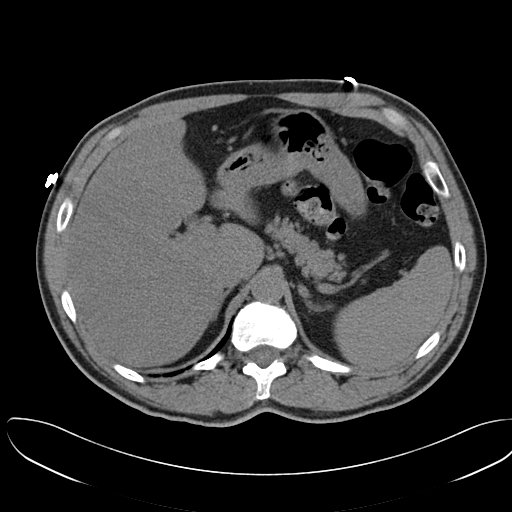
[im 133/160  lung]
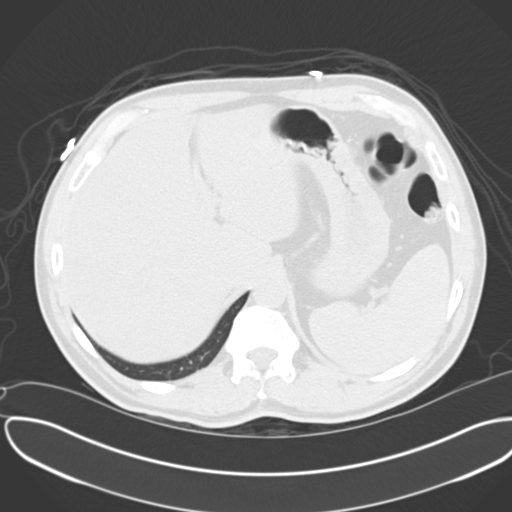
[im 140/160  soft-tissue]
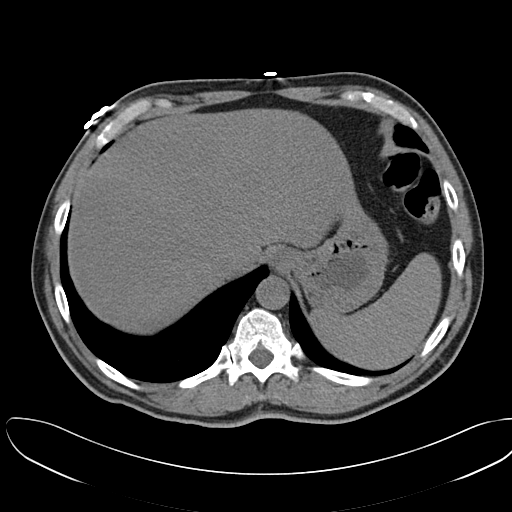
[im 140/160  lung]
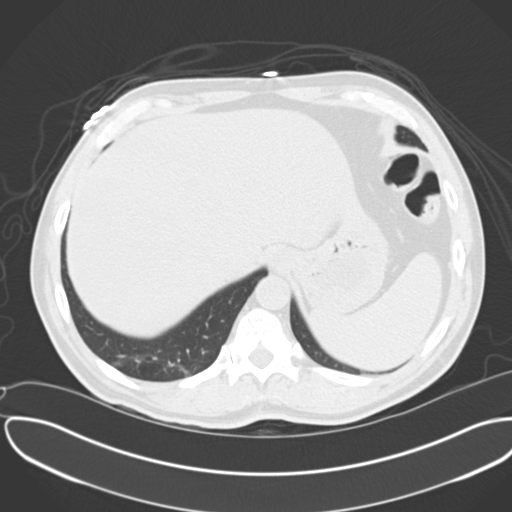
[im 146/160  lung]
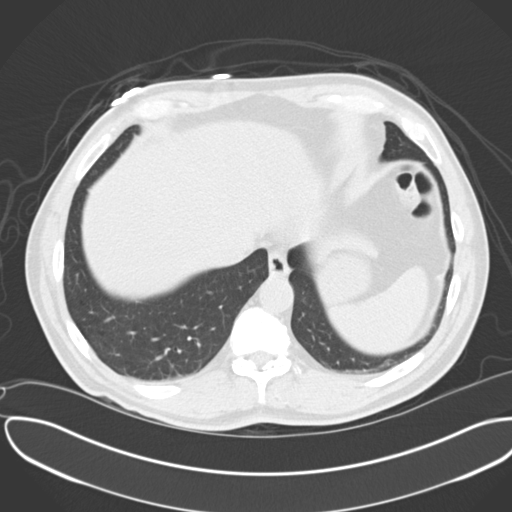
[im 153/160  soft-tissue]
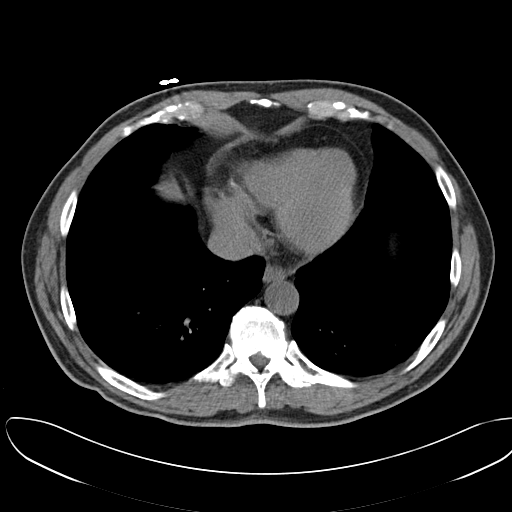
[im 153/160  lung]
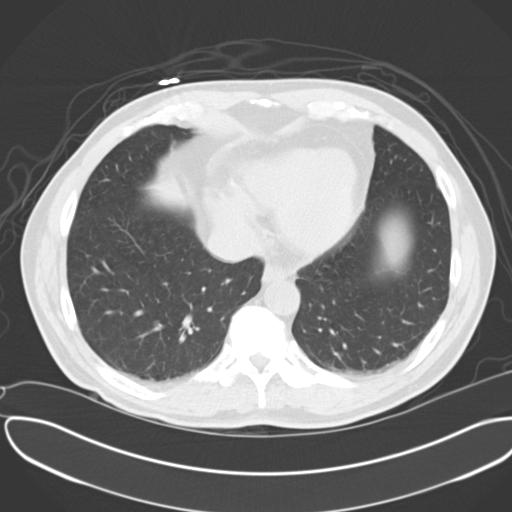

[14 of 32 positions shown; findings below may reference images not displayed]

PROCEDURE:     CT  - CT ABDOMEN AND PELVIS W[DATE] [DATE]

RESULT:     Axial noncontrast CT scanning was performed through the abdomen
and pelvis with reconstructions at 3 mm intervals and slice thicknesses.
Review of multiplanar reconstructed images was performed separately on the
VIA monitor.

Kidneys are normal in contour. There are no calcified kidney stones
demonstrated. Along the course of the ureters no definite stones are
evident. The urinary bladder is partially distended. There is a moderate
impression upon the urinary bladder base finding of enlarged prostate gland.

There is a large kidney stone which appears impacted in the gallbladder
neck. It is partially calcified. It measures 3.3 by 2.3 cm. The liver,
spleen, pancreas, nondistended stomach, and periaortic and pericaval regions
are normal in appearance. There are bilateral adrenal masses. These measure
approximately 1.5 cm in diameter. There is no periaortic or pericaval
lymphadenopathy. The unopacified loops of small and large bowel are normal
in appearance Tatiyana the appendix is normal in caliber and uninflamed. There
is a small right inguinal arch and left inguinal hernia containing fat.

The lung bases exhibit minimal compressive atelectasis. The lumbar vertebral
bodies are preserved in height.
IMPRESSION: 1. There is no evidence of urinary tract obstruction nor of urinary tract
stones. There is minimal prominence of the soft tissues in the perinephric
spaces which may be normal. I cannot exclude pyelonephritis in the
appropriate clinical setting.
2. There is a large calcified stone which may be impacted in the gallbladder
neck. There is no evidence of gallbladder wall thickening or pericholecystic
fluid.
3. There is no acute bowel abnormality.
4. The prostate gland is enlarged and produces a prominent impression upon
the urinary bladder base.
5. There are bilateral adrenal masses exhibiting Hounsfield measurements of
-1 which likely reflect adenomas.
6. There is a small left inguinal hernia containing fat.

A preliminary report was sent to the [HOSPITAL] the conclusion
of the study.

[REDACTED]

## 2015-03-01 NOTE — H&P (Signed)
PATIENT NAME:  Brian Dean, Brian Dean MR#:  [id number] DATE OF BIRTH:  [birth year]  DATE OF ADMISSION:  07/08/2013  REFERRING PHYSICIAN:  Dr. Jacqualine Code.  PRIMARY CARE PHYSICIAN:  Dr.  Golden Pop.   CHIEF COMPLAINT:  Lower back pain and dysuria.   HISTORY OF PRESENT ILLNESS:  This is a 59 year old male with significant past medical history of diabetes and hypertension and reflux disease who presents with complaints of lower back pain and dysuria, the patient reports these symptoms started before 4 to 5 days, the patient had urinalysis done in ED which did show significant UTI with 366 white blood cell and +3 leukocyte esterase and positive nitrites, the patient had CT abdomen and pelvis which did show  perinephric edema, nonspecific, which may represent underlying renal disease or less likely symmetric pyelonephritis, so given the fact the patient is diabetic, hospitalist service were requested to admit the patient for further treatment of his acute pyelonephritis, the patient reports having some low-grade temp at home, even though he was afebrile here, denies any nausea, vomiting, diarrhea, he reports some constipation, as well he reports having loss of appetite.   PAST MEDICAL HISTORY: 1.  Diabetes mellitus.  2.  Hypertension.  3.  Gastroesophageal reflux disease.  4.  History of UTI in the past.   PAST SURGICAL HISTORY:  Elbow surgery last year due to bursitis.   SOCIAL HISTORY:  The patient is smoking 1-1/2 pack every day, uses marijuana occasionally.   FAMILY HISTORY:  Significant for polycystic kidney disease.   ALLERGIES:  No known drug allergies.   HOME MEDICATIONS: 1.  Aspirin 81 mg oral daily.  2.  Metformin 500 mg oral three times a day.  3.  Lisinopril 20 mg oral daily.  4.  Cinnamon 500 mg oral 2 capsules 2 times a day.  5.  Prilosec 20 mg oral daily.  6.  Vitamin B6 500 mg oral daily.   REVIEW OF SYSTEMS:  CONSTITUTIONAL:  The patient reports low-grade temp and chills, complains  of generalized weakness.  Denies weight gain, weight loss.  EYES:  Denies blurry vision, double vision, inflammation, glaucoma.  EARS, NOSE, THROAT:  Denies tinnitus, ear pain, epistaxis or discharge.  RESPIRATORY:  Denies cough, wheezing, hemoptysis, COPD.  CARDIOVASCULAR:  Denies chest pain, edema, arrhythmia, palpitation.  GASTROINTESTINAL:  Had complains of some mild nausea, denies any vomiting, diarrhea, abdominal pain, hematemesis, melena, jaundice.  GENITOURINARY:  Complains of dysuria.  Denies any hematuria, renal colic.  ENDOCRINE:  Denies polyuria, polydipsia, heat or cold intolerance.  HEMATOLOGY:  Denies anemia, easy bruising, bleeding diathesis.  INTEGUMENTARY:  Denies acne, rash or skin lesions.  MUSCULOSKELETAL:  Denies any arthritis, cramps, swelling, gout.  Complains of lower back pain.  NEUROLOGIC:  Denies any CVA, TIA, ataxia, headache or vertigo.  PSYCHIATRIC:  Denies anxiety, insomnia, bipolar disorder or schizophrenia.   PHYSICAL EXAMINATION: VITAL SIGNS:  Temperature 98.3, pulse 78, respiratory rate 18, blood pressure 160/52, saturating 96% on room air.  GENERAL:  Well-nourished male who looks comfortable in bed in no apparent distress.  HEENT:  Head atraumatic, normocephalic.  Pupils equal, reactive to light.  Pink conjunctivae.  Anicteric sclerae.  Moist oral mucosa.  NECK:  Supple.  No thyromegaly.  No JVD.  CHEST:  Good air entry bilaterally.  No wheezing, rales or rhonchi.  CARDIOVASCULAR:  S1, S2 heard.  No rubs, murmurs or gallops.  ABDOMEN:  Soft, nontender, nondistended.  Bowel sounds present.  Has mild right side CVA tenderness, has lower  back pain to palpation.  EXTREMITIES:  No edema.  No clubbing.  No cyanosis.  Dorsalis pedis pulse +2 bilaterally.  MUSCULOSKELETAL:  Has no joint swelling or erythema.  Has positive straight leg raise test bilaterally. PSYCHIATRIC:  Appropriate affect.  Awake, alert x 3.  Intact judgment and insight.  NEUROLOGIC:  Cranial  nerves grossly intact.  Motor 5 out of 5.  No focal deficits.  LYMPHATICS:  No cervical or no supraclavicular lymphadenopathy.   LABORATORY DATA:  Glucose 105, BUN 17, creatinine 0.97, sodium 130, potassium 4.3, chloride 99, CO2 26, ALT 44, AST 26, alk phos 70, white blood cells 12, hemoglobin 13.6, hematocrit 38.6, platelets 209.  Urinalysis showing 366 white blood cells and +3 leukocyte esterase and positive for nitrite.   ASSESSMENT AND PLAN: 1.  Acute pyelonephritis, the patient has a positive urinalysis and bilateral perinephric edema on CAT scan, he will be started on Rocephin.  We will follow on the urine cultures.  2.  Lower back pain, this is due to muscle spasm, we will have him as needed Flexeril. 3.  Hypertension, acceptable.  Continue with home meds.  4.  Diabetes mellitus.  We will have the patient on insulin sliding scale.  We will hold his oral hypoglycemic agent while inpatient.  5.  Gastroesophageal reflux disease.  We will have him on Protonix.  6.  Deep vein thrombosis prophylaxis.  SubQ heparin.   Total time spent on admission and patient care 45 minutes.    ____________________________ Albertine Patricia, MD dse:ea D: 07/08/2013 00:12:05 ET T: 07/08/2013 00:56:12 ET JOB#: [id number]  cc: Albertine Patricia, MD, <Dictator> Ethelbert Thain Graciela Husbands MD ELECTRONICALLY SIGNED 07/08/2013 7:12

## 2015-03-01 NOTE — H&P (Signed)
PATIENT NAME:  CHAMBERLAIN, STEINBORN MR#:  [id number] DATE OF BIRTH:  [birth year]  DATE OF ADMISSION:  07/08/2013  ADDENDUM:   ASSESSMENT AND PLAN: Tobacco abuse: The patient was counseled about stop smoking, but at this point, he does not seem to be interested in stop smoking and he does not want nicotine patches.    ____________________________ Albertine Patricia, MD dse:nts D: 07/08/2013 00:14:05 ET T: 07/08/2013 00:20:44 ET JOB#: [id number]  cc: Albertine Patricia, MD, <Dictator> DAWOOD Graciela Husbands MD ELECTRONICALLY SIGNED 07/08/2013 7:12

## 2015-03-01 NOTE — Discharge Summary (Signed)
PATIENT NAME:  Brian Dean, Brian Dean MR#:  [id number] DATE OF BIRTH:  [birth year]  DATE OF ADMISSION:  07/08/2013 DATE OF DISCHARGE:  07/09/2013  ADMITTING DIAGNOSES: Acute pyelonephritis.   DISCHARGE DIAGNOSES: 1.  Acute pyelonephritis of unclear etiology at this time with dysuria and hematuria as well as urinary urgency.  2.  Acute on chronic lower back pain.  3.  Hyponatremia, likely dehydration, improving on IV fluids. 4.  History of diabetes mellitus with Hemoglobin A1c 5.9.  5.  History of hypertension, gastroesophageal reflux disease, tobacco as well as marijuana abuse.   DISCHARGE CONDITION: Stable.   DISCHARGE MEDICATIONS: The patient is to resume: 1.  Metformin 500 mg p.o. 3 times daily. 2.  Lisinopril 20 mg p.o. daily. 3.  Aspirin 81 mg p.o. daily. 4.  Prilosec 20 mg p.o. daily. 5.  Vitamin B6 500 mg p.o. daily. 6.  Cinnamon 500 mg p.o. 2 capsules twice daily.  NEW MEDICATIONS: 1.  Acetaminophen/hydrocodone 325/5 mg 1 tablet every 4 hours as needed.  2.  Keflex 500 mg p.o. 3 times daily for 7 more days. 3.  Lidocaine topical film 5% topically to lower back once a day.   HOME OXYGEN: None.   DIET: 2 grams salt, low fat, low cholesterol, carbohydrate-controlled diet, regular consistency.   ACTIVITY LIMITATIONS: As tolerated.   FOLLOWUP APPOINTMENTS:  With Dr. Jeananne Rama in 2 days after discharge.  CONSULTANTS: Care management, physical therapy.   RADIOLOGIC STUDIES: CT scan of abdomen and pelvis without contrast 07/07/2013 showed no evidence of urinary tract obstruction or urinary tract stones. There was minimal prominence of soft tissues in the perinephric spaces which may be normal. Cannot exclude pyelonephritis in the appropriate clinical setting. There was also a large calcified stone which may be impacted in the gallbladder neck, but no evidence of gallbladder wall thickening or pericholecystic fluid were noted. No acute bowel abnormality was noted. Prostate gland was  enlarged and produces a prominent impression upon the urinary bladder base. There are bilateral adrenal masses exhibiting Hounsfield measurements of 1 which are likely adenomas. There is a small left inguinal hernia containing fat.   HISTORY AND HOSPITAL COURSE: The patient is a 59 year old Caucasian male with history of lower back pains, history of diabetes, hypertension, gastroesophageal reflux disease as well as urinary tract infections in the past who presented to the hospital with complaints of dysuria, hematuria, urinary frequency and urgency. Please refer to Dr. Graciela Husbands admission note on 07/08/2013.   On arrival to the Emergency Room, the patient's temperature was 98.3, pulse was 78, respiratory rate was 18, blood pressure 160/52 and saturation was 96% on room air. Physical examination was unremarkable   The patient's lab data done on admission showed mild elevation of glucose to 105 and sodium 130; otherwise, BMP was unremarkable. Lipase level was normal at 126. The patient's total bilirubin was 1.5; however, the patient's liver enzymes were completely within normal limits. The patient's white blood cell count was elevated at 12.0, hemoglobin was 13.6 and platelet count was 209. Urinalysis revealed amber cloudy urine, negative for glucose, bilirubin or ketones. Specific gravity was 1.019, pH was 5.0, 3+ blood, 100 mg/dL of protein, positive for nitrites, 3+ leukocytes esterase, 211 red blood cells, 366 white blood cells, 3+ bacteria, 1 epithelial cell as well as white blood cell clumps and mucus were present. EKG showed normal sinus rhythm at 88 beats per minute, left axis deviation, LVH with QRS widening and repolarization abnormality, inferior infarct age undetermined. No acute ST-T changes  however were noted.   The patient was admitted to the hospital for further evaluation. He was started on IV fluids as well as IV antibiotics with Rocephin. His condition improved significantly with that. His  urinary urgency as well as pain almost resolved, and he was ready to be discharged home. Unfortunately, urine cultures were not yet back from the lab; however, they were growing gram-negative rods, more than 100,000 colony-forming units. Since the patient responded positively on Rocephin, we felt that Keflex would be appropriate therapy for him. The patient is to continue Keflex for 7 more days to complete a 9 day course for his acute pyelonephritis. He is to follow up with his primary care physician for further recommendations. His hematuria resolved.   In regards to acute on chronic lower back pain, we initiated the patient on hydrocodone as well as Lidoderm patch. The patient improved with this as well. He was evaluated by a physical therapist who recommended to discharge him from physical therapy. We felt that he will be able to do well at home.  In regards to hyponatremia, it was felt to be likely dehydration. We continued him on IV fluids and his sodium level improved. By the day of discharge, the patient's sodium level is 135. It is recommended to follow the patient's sodium level as outpatient and make decisions about evaluation of his hyponatremia if needed.   In regards to diabetes mellitus, we checked his Hemoglobin A1c was found to be 5.9. The patient is to continue his usual management for diabetes as well as diabetic diet.  For chronic medical problems such as hypertension and gastroesophageal reflux disease, the patient is to continue his outpatient therapy.  For tobacco abuse, the patient was counseled; however, he refused any nicotine replacement therapy.   He is being discharged in stable condition with the above-mentioned medications and follow-up. Vital signs on the day of discharge: Temperature 98.1, pulse was 77, respiratory rate was 17, blood pressure 133, ranging from 585 to 277 systolic and 82U to 23N diastolic, and O2 sats were 95% to 99% on room air at rest.   TIME SPENT: 40  minutes. ____________________________ Theodoro Grist, MD rv:sb D: 07/09/2013 14:18:41 ET T: 07/10/2013 07:10:56 ET JOB#: [id number]  cc: Theodoro Grist, MD, <Dictator> Guadalupe Maple, MD [place] MD ELECTRONICALLY SIGNED 07/29/2013 18:13

## 2015-03-01 NOTE — Discharge Summary (Signed)
Dates of Admission and Diagnosis:  Date of Admission 15-Sep-2013   Date of Discharge 16-Sep-2013   Admitting Diagnosis class 3 angina with abnormal stress test   Final Diagnosis As above   Discharge Diagnosis 1 CAD   2 Hypertension   3 Hyperlipidemia    Chief Complaint/History of Present Illness This is a 59 year old male with PMH of hypertension, hyperlipidemia and tobacco use who was seen for exertional chest pain with activites. He underwent a nuclear stress test which showed inferior wall ishcemia. He was referred for cardiac cath.   Routine Chem:  08-Nov-14 03:33   Glucose, Serum  148  BUN 11  Creatinine (comp) 0.76  Sodium, Serum 137  Potassium, Serum 4.2  Chloride, Serum 106  CO2, Serum 27  Calcium (Total), Serum 9.0  Anion Gap  4  Osmolality (calc) 276  eGFR (African American) >60  eGFR (Non-African American) >60 (eGFR values <40m/min/1.73 m2 may be an indication of chronic kidney disease (CKD). Calculated eGFR is useful in patients with stable renal function. The eGFR calculation will not be reliable in acutely ill patients when serum creatinine is changing rapidly. It is not useful in  patients on dialysis. The eGFR calculation may not be applicable to patients at the low and high extremes of body sizes, pregnant women, and vegetarians.)    07:42   Result Comment TROPONIN - RESULTS VERIFIED BY REPEAT TESTING.  - PREV. C/ 09-16-13 _0  BY PMH. AJO  Result(s) reported on 16 Sep 2013 at 08:21AM.  Cardiac:  08-Nov-14 03:33   CPK-MB, Serum 3.0 (Result(s) reported on 16 Sep 2013 at 05:03AM.)    07:42   Troponin I  0.28 (0.00-0.05 0.05 ng/mL or less: NEGATIVE  Repeat testing in 3-6 hrs  if clinically indicated. >0.05 ng/mL: POTENTIAL  MYOCARDIAL INJURY. Repeat  testing in 3-6 hrs if  clinically indicated. NOTE: An increase or decrease  of 30% or more on serial  testing suggests a  clinically important change)  CPK-MB, Serum 2.5 (Result(s) reported on 16 Sep 2013 at 08:19AM.)   PERTINENT RADIOLOGY STUDIES: LabUnknown:    [facility] 10[facility] which showed 80% ulcerated plaque in proximal LCX and 90% in R PL1 (too small). A PCI and DES placement was performed on proximal LCX.  He was monitored overnight with no complaints. Post procedure TnI was 0.55. However, CKMB was normal. He had no chest pain and no EKG changes. Thus, this does not represent a post procedure MI.  He was noted to be bradycardiac and thus not started on a beta blocker.   Condition on Discharge Good   DISCHARGE INSTRUCTIONS HOME MEDS:  Medication Reconciliation: Patient's Home Medications at Discharge:     Medication Instructions  metformin 500 mg oral tablet  500 milligram(s) orally 3 times a day   lisinopril 20 mg oral tablet  1 tab(s) orally once a day   aspir 81 81 mg oral tablet  1 tab(s) orally once a day   vitamin b6  500 milligram(s) orally once a day   lipitor 10 mg oral tablet  1 tab(s) orally once a day (at bedtime)   proscar 5 mg oral tablet  1 tab(s) orally once a day   tadalafil 5 mg oral tablet  1 tab(s) orally once a day, As Needed   clopidogrel 75 mg oral tablet  1 tab(s) orally once a day   pantoprazole  40 mg oral delayed release tablet  1 tab(s) orally once a day    PRESCRIPTIONS: ELECTRONICALLY SUBMITTED  STOP TAKING THE FOLLOWING MEDICATION(S):   Prilosec  Physician's Instructions:  Home Health? No   Dressing Care Remove dressing tomorrow.  May shower.   Home Oxygen? No   Diet Low Fat, Low Cholesterol   Activity Limitations As tolerated  No heavy lifting   Return to Work 09/26/2013   Time frame for Follow Up Appointment 1-2 weeks   Electronic Signatures: Kathlyn Sacramento (MD)  (Signed [phone] 09:18)  Authored: ADMISSION DATE AND DIAGNOSIS, CHIEF COMPLAINT/HPI, PERTINENT LABS, [place], PATIENT INSTRUCTIONS   Last Updated: 08-Nov-14 09:18 by Kathlyn Sacramento (MD)

## 2015-04-04 LAB — HM DIABETES FOOT EXAM

## 2015-05-06 ENCOUNTER — Other Ambulatory Visit: Payer: Self-pay

## 2015-08-26 ENCOUNTER — Other Ambulatory Visit: Payer: Self-pay

## 2015-08-26 ENCOUNTER — Telehealth: Payer: Self-pay | Admitting: Cardiovascular Disease

## 2015-08-26 MED ORDER — LISINOPRIL 20 MG PO TABS: 20.0000 mg | ORAL_TABLET | Freq: Every day | ORAL | Status: DC

## 2015-08-26 MED ORDER — FINASTERIDE 5 MG PO TABS: 5.0000 mg | ORAL_TABLET | Freq: Every day | ORAL | Status: DC

## 2015-08-26 MED ORDER — PANTOPRAZOLE SODIUM 40 MG PO TBEC: 40.0000 mg | DELAYED_RELEASE_TABLET | Freq: Every day | ORAL | Status: DC

## 2015-08-26 MED ORDER — CLOPIDOGREL BISULFATE 75 MG PO TABS: 75.0000 mg | ORAL_TABLET | Freq: Every day | ORAL | Status: DC

## 2015-08-26 MED ORDER — NITROGLYCERIN 0.4 MG SL SUBL: 0.4000 mg | SUBLINGUAL_TABLET | SUBLINGUAL | Status: DC | PRN

## 2015-08-26 MED ORDER — ATORVASTATIN CALCIUM 10 MG PO TABS: 10.0000 mg | ORAL_TABLET | Freq: Every day | ORAL | Status: DC

## 2015-08-26 NOTE — Telephone Encounter (Signed)
Pt calling stating that he would like all refills to go to [facility] in [place]

## 2015-08-26 NOTE — Telephone Encounter (Signed)
Refills sent to [facility] on [facility] cardiac meds.

## 2015-08-26 NOTE — Telephone Encounter (Signed)
Request for refill. Pt has not been seen since 05/21/14. Would you like to refill this Rx? Please advise

## 2015-08-26 NOTE — Telephone Encounter (Signed)
[facility] [place] for cardiac meds.

## 2015-08-26 NOTE — Telephone Encounter (Signed)
Refill sent for lisinopril to [facility] [place].

## 2015-08-26 NOTE — Addendum Note (Signed)
Addended by: Anselm Pancoast on: 08/26/2015 03:41 PM   Modules accepted: Orders, Medications

## 2015-09-11 DIAGNOSIS — N529 Male erectile dysfunction, unspecified: Secondary | ICD-10-CM | POA: Insufficient documentation

## 2015-09-11 DIAGNOSIS — I5020 Unspecified systolic (congestive) heart failure: Secondary | ICD-10-CM | POA: Insufficient documentation

## 2015-09-11 DIAGNOSIS — E7800 Pure hypercholesterolemia, unspecified: Secondary | ICD-10-CM | POA: Insufficient documentation

## 2015-09-11 DIAGNOSIS — I10 Essential (primary) hypertension: Secondary | ICD-10-CM | POA: Insufficient documentation

## 2015-09-11 DIAGNOSIS — E119 Type 2 diabetes mellitus without complications: Secondary | ICD-10-CM | POA: Insufficient documentation

## 2015-09-11 DIAGNOSIS — K219 Gastro-esophageal reflux disease without esophagitis: Secondary | ICD-10-CM | POA: Insufficient documentation

## 2015-09-11 DIAGNOSIS — E1140 Type 2 diabetes mellitus with diabetic neuropathy, unspecified: Secondary | ICD-10-CM | POA: Insufficient documentation

## 2015-09-11 DIAGNOSIS — I119 Hypertensive heart disease without heart failure: Secondary | ICD-10-CM | POA: Insufficient documentation

## 2015-09-11 DIAGNOSIS — E785 Hyperlipidemia, unspecified: Secondary | ICD-10-CM | POA: Insufficient documentation

## 2015-10-07 ENCOUNTER — Ambulatory Visit: Payer: Self-pay | Admitting: Family Medicine

## 2015-10-11 ENCOUNTER — Ambulatory Visit (INDEPENDENT_AMBULATORY_CARE_PROVIDER_SITE_OTHER): Payer: Commercial Managed Care - HMO | Admitting: Cardiovascular Disease

## 2015-10-11 ENCOUNTER — Encounter: Payer: Self-pay | Admitting: Cardiovascular Disease

## 2015-10-11 VITALS — BP 114/64 | HR 60 | Ht 72.0 in | Wt 182.2 lb

## 2015-10-11 DIAGNOSIS — E785 Hyperlipidemia, unspecified: Secondary | ICD-10-CM

## 2015-10-11 DIAGNOSIS — I5020 Unspecified systolic (congestive) heart failure: Secondary | ICD-10-CM | POA: Diagnosis not present

## 2015-10-11 DIAGNOSIS — E119 Type 2 diabetes mellitus without complications: Secondary | ICD-10-CM

## 2015-10-11 DIAGNOSIS — F17200 Nicotine dependence, unspecified, uncomplicated: Secondary | ICD-10-CM

## 2015-10-11 DIAGNOSIS — I119 Hypertensive heart disease without heart failure: Secondary | ICD-10-CM

## 2015-10-11 DIAGNOSIS — Z720 Tobacco use: Secondary | ICD-10-CM | POA: Diagnosis not present

## 2015-10-11 DIAGNOSIS — I25119 Atherosclerotic heart disease of native coronary artery with unspecified angina pectoris: Secondary | ICD-10-CM

## 2015-10-11 NOTE — Assessment & Plan Note (Signed)
Blood pressure is well controlled on today's visit. No changes made to the medications. 

## 2015-10-11 NOTE — Progress Notes (Signed)
Patient ID: Brian Dean, male    DOB: Oct 16, 1956, 59 y.o.   MRN: [id number]  HPI Comments: Brian Dean is a very pleasant 59 year old gentleman with a history of diabetes, hypertension, long smoking history ,  previous episodes of chest pain , CT scan of his abdomen showing large gallstone, prostate issues who is scheduled to have a prostate procedure,  Positive stress test, who underwent cardiac catheterization who presents for routine followup of his coronary artery disease Cardiac catheterization showed severe proximal left circumflex disease, 50% lesions in his LAD, 40% RCA disease. Stent placed to his proximal left circumflex. Stent placed 09/25/2013  In follow-up, he is using a vapor cigarettes, rare smoking. Sometimes marijuana. Excessive bruising on his hands, would like to stop Plavix Denies any symptoms concerning for angina, active at baseline, no significant shortness of breath No regular exercise program  EKG on today's visit shows normal sinus rhythm with rate 60 bpm, left anterior fascicular block, intraventricular conduction delay  Other past medical history Previous Stress Myoview showed what appears to be a proximally occluded RCA with peri-infarct ischemia.   Left anterior descending and left circumflex territory appear intact with no perfusion abnormality. Ejection fraction 47% with wall motion abnormality in the inferior wall.  Cardiac catheterization performed as above  Notes indicate admission from August 30 to 07/09/2013. He was admitted for acute pyelonephritis, acute on chronic low back pain, hyponatremia, diabetes, hypertension  CT scan 07/07/2013 shows large calcified stone in the gallbladder neck, enlarged prostate He denies having any symptoms from his gallbladder stones Recent lab work showing total cholesterol 144, LDL 80, HDL 39    No Known Allergies  Current Outpatient Prescriptions on File Prior to Visit  Medication Sig Dispense Refill  .  aspirin 81 MG tablet Take 162 mg by mouth daily.     Marland Kitchen atorvastatin (LIPITOR) 10 MG tablet Take 1 tablet (10 mg total) by mouth daily. 90 tablet 3  . CINNAMON PO Take 1,000 mg by mouth 3 (three) times daily.     . finasteride (PROSCAR) 5 MG tablet Take 1 tablet (5 mg total) by mouth daily. 90 tablet 3  . HYDROcodone-acetaminophen (NORCO) 5-325 MG per tablet Take 1 tablet by mouth every 6 (six) hours as needed.      Marland Kitchen lisinopril (PRINIVIL,ZESTRIL) 20 MG tablet Take 1 tablet (20 mg total) by mouth daily. 90 tablet 3  . metFORMIN (GLUCOPHAGE) 500 MG tablet Take 500 mg by mouth 3 (three) times daily.     . nitroGLYCERIN (NITROSTAT) 0.4 MG SL tablet Place 1 tablet (0.4 mg total) under the tongue every 5 (five) minutes as needed. 25 tablet 3  . pantoprazole (PROTONIX) 40 MG tablet Take 1 tablet (40 mg total) by mouth daily. 30 tablet 6  . pyridOXINE (VITAMIN B-6) 100 MG tablet Take 100 mg by mouth daily.    . tadalafil (CIALIS) 5 MG tablet Take 5 mg by mouth daily as needed for erectile dysfunction.     No current facility-administered medications on file prior to visit.    Past Medical History  Diagnosis Date  . Hypertension   . Arthritis   . Syncope and collapse   . CAD (coronary artery disease) Nov 2014    diffuse, aggresive risk factor modification recommended  . Diabetes mellitus without complication ([place])   . Hyperlipidemia   . Hypercholesteremia   . Dyslipidemia   . GERD (gastroesophageal reflux disease)   . Tobacco use   . Hypertensive heart disease   .  Unspecified systolic (congestive) heart failure ([place])   . ED (erectile dysfunction)   . Diabetic neuropathy ([place])   . Inguinal hernia   . History of acute pyelonephritis Aug. 2014    hospitalized; 100k E. coli pansensitive    Past Surgical History  Procedure Laterality Date  . Elbow arthroscopy  2001    lt  . Colonoscopy    . Gum surgery    . Wisdom tooth extraction    . Olecranon bursectomy  11/12/2011    Procedure:  OLECRANON BURSA;  Surgeon: Linna Hoff;  Location: [facility];  Service: Orthopedics;  Laterality: Right;  olecracnon bursectomy with delayed closure  . Cardiac catheterization  11/14    [facility] x1    Social History  reports that he has quit smoking. His smoking use included Cigarettes. He has a 60 pack-year smoking history. He has never used smokeless tobacco. He reports that he drinks alcohol. He reports that he does not use illicit drugs.  Family History family history includes Cancer in his mother; Diabetes in his brother, father, mother, sister, sister, sister, sister, and sister; Heart disease in his father and mother; Hypertension in his father and mother; Stroke in his sister.   Review of Systems  Constitutional: Negative.   Respiratory: Negative.   Cardiovascular: Negative.   Gastrointestinal: Negative.   Musculoskeletal: Negative.   Neurological: Negative.   Hematological: Negative.   Psychiatric/Behavioral: Negative.   All other systems reviewed and are negative.   BP 114/64 mmHg  Pulse 60  Ht 6' (1.829 m)  Wt 182 lb 4 oz (82.668 kg)  BMI 24.71 kg/m2  Physical Exam  Constitutional: He is oriented to person, place, and time. He appears well-developed and well-nourished.  HENT:  Head: Normocephalic.  Nose: Nose normal.  Mouth/Throat: Oropharynx is clear and moist.  Eyes: Conjunctivae are normal. Pupils are equal, round, and reactive to light.  Neck: Normal range of motion. Neck supple. No JVD present.  Cardiovascular: Normal rate, regular rhythm, S1 normal, S2 normal, normal heart sounds and intact distal pulses.  Exam reveals no gallop and no friction rub.   No murmur heard. Pulmonary/Chest: Effort normal and breath sounds normal. No respiratory distress. He has no wheezes. He has no rales. He exhibits no tenderness.  Abdominal: Soft. Bowel sounds are normal. He exhibits no distension. There is no tenderness.  Musculoskeletal: Normal range of motion.  He exhibits no edema or tenderness.  Lymphadenopathy:    He has no cervical adenopathy.  Neurological: He is alert and oriented to person, place, and time. Coordination normal.  Skin: Skin is warm and dry. No rash noted. No erythema.  Psychiatric: He has a normal mood and affect. His behavior is normal. Judgment and thought content normal.      Assessment and Plan   Nursing note and vitals reviewed.

## 2015-10-11 NOTE — Assessment & Plan Note (Signed)
Currently with no symptoms of angina. No further workup at this time. Continue current medication regimen.  we will hold his Plavix given excessive bruising. Stent placed 2 years ago

## 2015-10-11 NOTE — Assessment & Plan Note (Signed)
Rare smoking, sometimes marijuana, uses vapor. We have encouraged him to continue to work on weaning his cigarettes and smoking cessation. He will continue to work on this and does not want any assistance with chantix.

## 2015-10-11 NOTE — Patient Instructions (Addendum)
You are doing well.  If cholesterol runs high, (Goal LDL <70), We would start zetia one a day in addition to your lipitor 10 mg one a day  Hold the plavix  Please call us if you have new issues that need to be addressed before your next appt.  Your physician wants you to follow-up in: 12 months.  You will receive a reminder letter in the mail two months in advance. If you don't receive a letter, please call our office to schedule the follow-up appointment.

## 2015-10-11 NOTE — Assessment & Plan Note (Signed)
He reports that he will have lab work with Dr. Sanda Klein. If LDL continues to run greater than 70, recommended he start zetia 10 mg daily in addition to Lipitor 10

## 2015-10-11 NOTE — Assessment & Plan Note (Signed)
Encouraged him to watch his diet. Hemoglobin A1c has increased 5.9 up to 6.2 per the patient

## 2015-10-15 ENCOUNTER — Other Ambulatory Visit: Payer: Self-pay | Admitting: Family Medicine

## 2015-10-15 ENCOUNTER — Ambulatory Visit: Payer: Self-pay | Admitting: Family Medicine

## 2015-10-15 NOTE — Telephone Encounter (Signed)
Your patient 

## 2015-10-17 NOTE — Telephone Encounter (Signed)
Please let Jadden D Roe know that I'd like to see patient for an appointment here in the office for:  Diabetes, high cholesterol Please schedule a visit with me  in the next: 3-4 weeks Fasting?  yes Thank you, Dr. Sanda Klein Rx approved

## 2015-10-18 NOTE — Telephone Encounter (Signed)
Called patient and left vm to call us back and scheudle a f/u diabetes with fasting labs in the next 3-4 week.

## 2015-10-25 ENCOUNTER — Encounter: Payer: Self-pay | Admitting: Family Medicine

## 2015-10-25 ENCOUNTER — Ambulatory Visit (INDEPENDENT_AMBULATORY_CARE_PROVIDER_SITE_OTHER): Payer: Commercial Managed Care - HMO | Admitting: Family Medicine

## 2015-10-25 VITALS — BP 146/84 | HR 70 | Temp 98.0°F | Ht 70.5 in | Wt 180.0 lb

## 2015-10-25 DIAGNOSIS — Z1211 Encounter for screening for malignant neoplasm of colon: Secondary | ICD-10-CM | POA: Diagnosis not present

## 2015-10-25 DIAGNOSIS — Z23 Encounter for immunization: Secondary | ICD-10-CM | POA: Diagnosis not present

## 2015-10-25 DIAGNOSIS — E119 Type 2 diabetes mellitus without complications: Secondary | ICD-10-CM | POA: Diagnosis not present

## 2015-10-25 DIAGNOSIS — E1142 Type 2 diabetes mellitus with diabetic polyneuropathy: Secondary | ICD-10-CM

## 2015-10-25 DIAGNOSIS — I119 Hypertensive heart disease without heart failure: Secondary | ICD-10-CM

## 2015-10-25 DIAGNOSIS — B353 Tinea pedis: Secondary | ICD-10-CM

## 2015-10-25 DIAGNOSIS — K219 Gastro-esophageal reflux disease without esophagitis: Secondary | ICD-10-CM

## 2015-10-25 DIAGNOSIS — Z5181 Encounter for therapeutic drug level monitoring: Secondary | ICD-10-CM

## 2015-10-25 DIAGNOSIS — E785 Hyperlipidemia, unspecified: Secondary | ICD-10-CM

## 2015-10-25 DIAGNOSIS — I2510 Atherosclerotic heart disease of native coronary artery without angina pectoris: Secondary | ICD-10-CM | POA: Diagnosis not present

## 2015-10-25 NOTE — Assessment & Plan Note (Signed)
Check SGPT, creatinine

## 2015-10-25 NOTE — Assessment & Plan Note (Signed)
Encouraged DASH guidelines; goal systolic less than 123456; patient to check BP a few times a week for the next 2-3 weeks and call if over 130 mmHg

## 2015-10-25 NOTE — Assessment & Plan Note (Signed)
Reviewed cardiology note; will check fasting lipids next Thursday; encouraged him to avoid saturated fats

## 2015-10-25 NOTE — Assessment & Plan Note (Signed)
Check A1c today; foot exam by MD today; patient encouraged to check feet nightly; limit sweets and simple carbs

## 2015-10-25 NOTE — Patient Instructions (Addendum)
You have my blessing to not check your fingerstick blood sugars I recommend that you check your feet every night and let me know right away of any sores or callusus or ulcers Try lamisil or lotrimin on the feet nightly for one month You received the flu shot today; it should protect you against the flu virus over the coming months; it will take about two weeks for antibodies to develop; do try to stay away from hospitals, nursing homes, and daycares during peak flu season; taking extra vitamin C daily during flu season may help you avoid getting sick  Your goal blood pressure is less than 130 mmHg on top. Try to follow the DASH guidelines (DASH stands for Dietary Approaches to Stop Hypertension) Try to limit the sodium in your diet.  Ideally, consume less than 1.5 grams (less than 1,500mg ) per day. Do not add salt when cooking or at the table.  Check the sodium amount on labels when shopping, and choose items lower in sodium when given a choice. Avoid or limit foods that already contain a lot of sodium. Eat a diet rich in fruits and vegetables and whole grains. Check your blood pressure a few times a week for the next 2-3 weeks and call me it top number is not staying under 130  DASH Eating Plan DASH stands for "Dietary Approaches to Stop Hypertension." The DASH eating plan is a healthy eating plan that has been shown to reduce high blood pressure (hypertension). Additional health benefits may include reducing the risk of type 2 diabetes mellitus, heart disease, and stroke. The DASH eating plan may also help with weight loss. WHAT DO I NEED TO KNOW ABOUT THE DASH EATING PLAN? For the DASH eating plan, you will follow these general guidelines:  Choose foods with a percent daily value for sodium of less than 5% (as listed on the food label).  Use salt-free seasonings or herbs instead of table salt or sea salt.  Check with your health care provider or pharmacist before using salt substitutes.  Eat  lower-sodium products, often labeled as "lower sodium" or "no salt added."  Eat fresh foods.  Eat more vegetables, fruits, and low-fat dairy products.  Choose whole grains. Look for the word "whole" as the first word in the ingredient list.  Choose fish and skinless chicken or [place] more often than red meat. Limit fish, poultry, and meat to 6 oz (170 g) each day.  Limit sweets, desserts, sugars, and sugary drinks.  Choose heart-healthy fats.  Limit cheese to 1 oz (28 g) per day.  Eat more home-cooked food and less restaurant, buffet, and fast food.  Limit fried foods.  Cook foods using methods other than frying.  Limit canned vegetables. If you do use them, rinse them well to decrease the sodium.  When eating at a restaurant, ask that your food be prepared with less salt, or no salt if possible. WHAT FOODS CAN I EAT? Seek help from a dietitian for individual calorie needs. Grains Whole grain or whole wheat bread. Brown rice. Whole grain or whole wheat pasta. Quinoa, bulgur, and whole grain cereals. Low-sodium cereals. Corn or whole wheat flour tortillas. Whole grain cornbread. Whole grain crackers. Low-sodium crackers. Vegetables Fresh or frozen vegetables (raw, steamed, roasted, or grilled). Low-sodium or reduced-sodium tomato and vegetable juices. Low-sodium or reduced-sodium tomato sauce and paste. Low-sodium or reduced-sodium canned vegetables.  Fruits All fresh, canned (in natural juice), or frozen fruits. Meat and Other Protein Products Ground beef (85% or  leaner), grass-fed beef, or beef trimmed of fat. Skinless chicken or [place]. Ground chicken or [place]. Pork trimmed of fat. All fish and seafood. Eggs. Dried beans, peas, or lentils. Unsalted nuts and seeds. Unsalted canned beans. Dairy Low-fat dairy products, such as skim or 1% milk, 2% or reduced-fat cheeses, low-fat ricotta or cottage cheese, or plain low-fat yogurt. Low-sodium or reduced-sodium cheeses. Fats and  Oils Tub margarines without trans fats. Light or reduced-fat mayonnaise and salad dressings (reduced sodium). Avocado. Safflower, olive, or canola oils. Natural peanut or almond butter. Other Unsalted popcorn and pretzels. The items listed above may not be a complete list of recommended foods or beverages. Contact your dietitian for more options. WHAT FOODS ARE NOT RECOMMENDED? Grains White bread. White pasta. White rice. Refined cornbread. Bagels and croissants. Crackers that contain trans fat. Vegetables Creamed or fried vegetables. Vegetables in a cheese sauce. Regular canned vegetables. Regular canned tomato sauce and paste. Regular tomato and vegetable juices. Fruits Dried fruits. Canned fruit in light or heavy syrup. Fruit juice. Meat and Other Protein Products Fatty cuts of meat. Ribs, chicken wings, bacon, sausage, bologna, salami, chitterlings, fatback, hot dogs, bratwurst, and packaged luncheon meats. Salted nuts and seeds. Canned beans with salt. Dairy Whole or 2% milk, cream, half-and-half, and cream cheese. Whole-fat or sweetened yogurt. Full-fat cheeses or blue cheese. Nondairy creamers and whipped toppings. Processed cheese, cheese spreads, or cheese curds. Condiments Onion and garlic salt, seasoned salt, table salt, and sea salt. Canned and packaged gravies. Worcestershire sauce. Tartar sauce. Barbecue sauce. Teriyaki sauce. Soy sauce, including reduced sodium. Steak sauce. Fish sauce. Oyster sauce. Cocktail sauce. Horseradish. Ketchup and mustard. Meat flavorings and tenderizers. Bouillon cubes. Hot sauce. Tabasco sauce. Marinades. Taco seasonings. Relishes. Fats and Oils Butter, stick margarine, lard, shortening, ghee, and bacon fat. Coconut, palm kernel, or palm oils. Regular salad dressings. Other Pickles and olives. Salted popcorn and pretzels. The items listed above may not be a complete list of foods and beverages to avoid. Contact your dietitian for more  information. WHERE CAN I FIND MORE INFORMATION? National Heart, Lung, and Blood Institute: travelstabloid.com   This information is not intended to replace advice given to you by your health care provider. Make sure you discuss any questions you have with your health care provider.   Document Released: 10/15/2011 Document Revised: 11/16/2014 Document Reviewed: 08/30/2013 Elsevier Interactive Patient Education [employer].

## 2015-10-25 NOTE — Assessment & Plan Note (Signed)
Recently seen by cardiologist; continue aspirin; plavix was discontinued by heart doctor; check lipids today; no chest pain

## 2015-10-25 NOTE — Assessment & Plan Note (Signed)
Refer for colonoscopy 

## 2015-10-25 NOTE — Assessment & Plan Note (Signed)
Encouraged patient to check feet every night, alert me of any problems; we discussed risk of losing digits, feet, etc.

## 2015-10-25 NOTE — Assessment & Plan Note (Signed)
Well-controlled for the most part; using OTC product once in a while

## 2015-10-25 NOTE — Progress Notes (Signed)
BP 146/84 mmHg  Pulse 70  Temp(Src) 98 F (36.7 C)  Ht 5' 10.5" (1.791 m)  Wt 180 lb (81.647 kg)  BMI 25.45 kg/m2  SpO2 98%   Subjective:    Patient ID: Brian Dean, male    DOB: 03-Jun-1956, 59 y.o.   MRN: [id number]  HPI: [name] is a 59 y.o. male  Chief Complaint  Patient presents with  . Diabetes    "she told me to come back in"  . Hypertension  . Hyperlipidemia  . Referral    he would like a referral to get Colonoscopy done at [facility].    He went to see Dr. Rockey Situ last week and he stopped the Plavix He said that if his cholesterol is elevated to add Zetia Not sure about family hx with high cholesterol He admits to some dietary indiscretion; bacon and sausage; no more than 3 eggs per week; not a lot of red meat; no milk No chest pain  Diabetes; not checking FSBS at home; the insurance company quit paying; no FSBS needed per ACE guidelines I explained; no dry mouth, no excessive thirst No numbness or tingling in feet, some neuropathy in feet, he does not check feet every night  He has been having right hip, upper buttock just on the right side; in October it was along hurting along the right lateral leg; he thinks sciatica; no B/B dysfunction; no electric shocks, no leg weakness  Relevant past medical, surgical, family and social history reviewed and updated as indicated. Interim medical history since our last visit reviewed. Allergies and medications reviewed and updated.  Review of Systems  Per HPI unless specifically indicated above     Objective:    BP 146/84 mmHg  Pulse 70  Temp(Src) 98 F (36.7 C)  Ht 5' 10.5" (1.791 m)  Wt 180 lb (81.647 kg)  BMI 25.45 kg/m2  SpO2 98%  Wt Readings from Last 3 Encounters:  10/25/15 180 lb (81.647 kg)  10/11/15 182 lb 4 oz (82.668 kg)  04/04/15 176 lb (79.833 kg)    Physical Exam  Constitutional: He appears well-developed and well-nourished. No distress.  HENT:  Head: Normocephalic and atraumatic.   Eyes: EOM are normal. No scleral icterus.  Neck: No thyromegaly present.  Cardiovascular: Normal rate and regular rhythm.   Pulmonary/Chest: Effort normal and breath sounds normal.  Abdominal: Soft. Bowel sounds are normal. He exhibits no distension.  Musculoskeletal: He exhibits no edema.  Neurological: Coordination normal.  Skin: Skin is warm and dry. No pallor.  Psychiatric: He has a normal mood and affect. His behavior is normal. Judgment and thought content normal.   Diabetic Foot Form - Detailed   Diabetic Foot Exam - detailed  Diabetic Foot exam was performed with the following findings:  Yes 10/25/2015  1:51 PM  Visual Foot Exam completed.:  Yes  Are the toenails long?:  No  Are the toenails thick?:  No  Are the toenails ingrown?:  No    Pulse Foot Exam completed.:  Yes  Right Dorsalis Pedis:  Present Left Dorsalis Pedis:  Present  Semmes-Weinstein Monofilament Test  R Site 1-Great Toe:  Neg L Site 1-Great Toe:  Neg  R Site 4:  Pos L Site 4:  Pos  R Site 5:  Neg L Site 5:  Pos    Comments:  Diminished sensation on both feet right > left; scale and erythema consistent with athlete's foot       Results for orders  placed or performed in visit on 09/11/15  HM DIABETES EYE EXAM  Result Value Ref Range   HM Diabetic Eye Exam No Retinopathy No Retinopathy  HM DIABETES FOOT EXAM  Result Value Ref Range   HM Diabetic Foot Exam per PP       Assessment & Plan:   Problem List Items Addressed This Visit      Cardiovascular and Mediastinum   CAD (coronary artery disease)    Recently seen by cardiologist; continue aspirin; plavix was discontinued by heart doctor; check lipids today; no chest pain      Relevant Orders   CBC with Differential/Platelet   Hypertensive heart disease    Encouraged DASH guidelines; goal systolic less than 123456; patient to check BP a few times a week for the next 2-3 weeks and call if over 130 mmHg        Digestive   GERD (gastroesophageal  reflux disease)    Well-controlled for the most part; using OTC product once in a while        Endocrine   Diabetes mellitus without complication ([place]) - Primary    Check A1c today; foot exam by MD today; patient encouraged to check feet nightly; limit sweets and simple carbs      Relevant Orders   Hgb A1c w/o eAG   Microalbumin / creatinine urine ratio   Diabetic neuropathy ([place])    Encouraged patient to check feet every night, alert me of any problems; we discussed risk of losing digits, feet, etc.        Other   Hyperlipidemia    Reviewed cardiology note; will check fasting lipids next Thursday; encouraged him to avoid saturated fats      Relevant Orders   Lipid Panel w/o Chol/HDL Ratio   Colon cancer screening    Refer for colonoscopy      Relevant Orders   Ambulatory referral to Gastroenterology   Medication monitoring encounter    Check SGPT, creatinine      Relevant Orders   CBC with Differential/Platelet   Comprehensive metabolic panel    Other Visit Diagnoses    Needs flu shot        Encounter for immunization        Tinea pedis of both feet        use lotrimin or lamisil for one month        Follow up plan: Return in about 6 months (around 04/24/2016) for diabetes; 30 minutes; return on Thurs for fasting labs.  An after-visit summary was printed and given to the patient at [facility].  Please see the patient instructions which may contain other information and recommendations beyond what is mentioned above in the assessment and plan.  Orders Placed This Encounter  Procedures  . Flu Vaccine QUAD 36+ mos IM  . CBC with Differential/Platelet  . Hgb A1c w/o eAG  . Lipid Panel w/o Chol/HDL Ratio  . Comprehensive metabolic panel  . Microalbumin / creatinine urine ratio  . Ambulatory referral to Gastroenterology

## 2015-10-31 ENCOUNTER — Other Ambulatory Visit: Payer: Commercial Managed Care - HMO

## 2015-10-31 DIAGNOSIS — Z5181 Encounter for therapeutic drug level monitoring: Secondary | ICD-10-CM

## 2015-10-31 DIAGNOSIS — E785 Hyperlipidemia, unspecified: Secondary | ICD-10-CM

## 2015-10-31 DIAGNOSIS — E119 Type 2 diabetes mellitus without complications: Secondary | ICD-10-CM

## 2015-10-31 DIAGNOSIS — I2510 Atherosclerotic heart disease of native coronary artery without angina pectoris: Secondary | ICD-10-CM

## 2015-11-01 ENCOUNTER — Encounter: Payer: Self-pay | Admitting: Family Medicine

## 2015-11-01 DIAGNOSIS — E875 Hyperkalemia: Secondary | ICD-10-CM

## 2015-11-01 LAB — CBC WITH DIFFERENTIAL/PLATELET
Basophils Absolute: 0 10*3/uL (ref 0.0–0.2)
Basos: 0 %
EOS (ABSOLUTE): 0.2 10*3/uL (ref 0.0–0.4)
Eos: 3 %
Hematocrit: 41.2 % (ref 37.5–51.0)
Hemoglobin: 14.2 g/dL (ref 12.6–17.7)
Immature Grans (Abs): 0 10*3/uL (ref 0.0–0.1)
Immature Granulocytes: 0 %
Lymphocytes Absolute: 1.5 10*3/uL (ref 0.7–3.1)
Lymphs: 22 %
MCH: 30.3 pg (ref 26.6–33.0)
MCHC: 34.5 g/dL (ref 31.5–35.7)
MCV: 88 fL (ref 79–97)
Monocytes Absolute: 0.7 10*3/uL (ref 0.1–0.9)
Monocytes: 11 %
Neutrophils Absolute: 4.2 10*3/uL (ref 1.4–7.0)
Neutrophils: 64 %
Platelets: 241 10*3/uL (ref 150–379)
RBC: 4.68 x10E6/uL (ref 4.14–5.80)
RDW: 12.8 % (ref 12.3–15.4)
WBC: 6.7 10*3/uL (ref 3.4–10.8)

## 2015-11-01 LAB — COMPREHENSIVE METABOLIC PANEL
ALK PHOS: 60 IU/L (ref 39–117)
ALT: 38 IU/L (ref 0–44)
AST: 34 IU/L (ref 0–40)
Albumin/Globulin Ratio: 1.6 (ref 1.1–2.5)
Albumin: 4.6 g/dL (ref 3.5–5.5)
BILIRUBIN TOTAL: 0.7 mg/dL (ref 0.0–1.2)
BUN/Creatinine Ratio: 11 (ref 9–20)
BUN: 10 mg/dL (ref 6–24)
CHLORIDE: 99 mmol/L (ref 96–106)
CO2: 24 mmol/L (ref 18–29)
CREATININE: 0.94 mg/dL (ref 0.76–1.27)
Calcium: 9.9 mg/dL (ref 8.7–10.2)
GFR calc Af Amer: 102 mL/min/{1.73_m2} (ref >59)
GFR calc non Af Amer: 88 mL/min/{1.73_m2} (ref >59)
GLOBULIN, TOTAL: 2.8 g/dL (ref 1.5–4.5)
GLUCOSE: 149 mg/dL — AB (ref 65–99)
Potassium: 5.4 mmol/L — ABNORMAL HIGH (ref 3.5–5.2)
SODIUM: 138 mmol/L (ref 134–144)
Total Protein: 7.4 g/dL (ref 6.0–8.5)

## 2015-11-01 LAB — MICROALBUMIN / CREATININE URINE RATIO
Creatinine, Urine: 46.2 mg/dL
MICROALB/CREAT RATIO: 16.2 mg/g creat (ref 0.0–30.0)
Microalbumin, Urine: 7.5 ug/mL

## 2015-11-01 LAB — LIPID PANEL W/O CHOL/HDL RATIO
Cholesterol, Total: 129 mg/dL (ref 100–199)
HDL: 43 mg/dL (ref >39)
LDL Calculated: 72 mg/dL (ref 0–99)
Triglycerides: 72 mg/dL (ref 0–149)
VLDL Cholesterol Cal: 14 mg/dL (ref 5–40)

## 2015-11-01 LAB — HGB A1C W/O EAG: Hgb A1c MFr Bld: 6.8 % — ABNORMAL HIGH (ref 4.8–5.6)

## 2015-12-29 ENCOUNTER — Telehealth: Payer: Self-pay | Admitting: Family Medicine

## 2015-12-29 DIAGNOSIS — E875 Hyperkalemia: Secondary | ICD-10-CM | POA: Insufficient documentation

## 2015-12-29 NOTE — Telephone Encounter (Signed)
Please remind patient to come in for his recheck potassium I sent him a letter and it was due in January I still do not have those results I'm cancelling the old order and entering a new one Thank you

## 2015-12-30 NOTE — Telephone Encounter (Signed)
Called patient and no answer, left vm to return out call to schedule  Recheck for potassium.

## 2016-01-13 ENCOUNTER — Other Ambulatory Visit: Payer: Self-pay | Admitting: Family Medicine

## 2016-01-13 NOTE — Telephone Encounter (Signed)
Last cmp reviewed; metformin approved AMY, please gently remind patient that he is due to have his potassium rechecked; thank you

## 2016-01-13 NOTE — Telephone Encounter (Signed)
Left message to call.

## 2016-01-13 NOTE — Telephone Encounter (Signed)
Patient notified

## 2016-01-14 NOTE — Telephone Encounter (Signed)
TERESA, Please follow-up on the message below Thank you

## 2016-01-16 ENCOUNTER — Encounter: Payer: Self-pay | Admitting: Family Medicine

## 2016-01-16 ENCOUNTER — Other Ambulatory Visit: Payer: Commercial Managed Care - HMO

## 2016-01-16 DIAGNOSIS — E875 Hyperkalemia: Secondary | ICD-10-CM

## 2016-01-16 NOTE — Telephone Encounter (Signed)
Letter sent home 01/16/16 °

## 2016-01-17 ENCOUNTER — Telehealth: Payer: Self-pay | Admitting: Family Medicine

## 2016-01-17 DIAGNOSIS — E875 Hyperkalemia: Secondary | ICD-10-CM

## 2016-01-17 LAB — POTASSIUM: Potassium: 5.3 mmol/L — ABNORMAL HIGH (ref 3.5–5.2)

## 2016-01-17 NOTE — Telephone Encounter (Signed)
His potassium was still high, but better. Please stop bananas or any salt substitutes and come in next week for a recheck on THursday.

## 2016-01-20 NOTE — Telephone Encounter (Signed)
Left message for patient to return my call.

## 2016-01-21 NOTE — Telephone Encounter (Signed)
Patient notified

## 2016-01-23 ENCOUNTER — Other Ambulatory Visit: Payer: Commercial Managed Care - HMO

## 2016-01-23 DIAGNOSIS — E875 Hyperkalemia: Secondary | ICD-10-CM

## 2016-01-24 ENCOUNTER — Other Ambulatory Visit: Payer: Commercial Managed Care - HMO

## 2016-01-24 ENCOUNTER — Telehealth: Payer: Self-pay | Admitting: Family Medicine

## 2016-01-24 LAB — POTASSIUM: Potassium: 4.9 mmol/L (ref 3.5–5.2)

## 2016-01-24 NOTE — Telephone Encounter (Signed)
Patient notified

## 2016-01-24 NOTE — Telephone Encounter (Signed)
Please let him know that his potassium came back normal. We do not need to change his medicine.

## 2016-04-13 ENCOUNTER — Other Ambulatory Visit: Payer: Self-pay

## 2016-04-13 MED ORDER — METFORMIN HCL 500 MG PO TABS: 500.0000 mg | ORAL_TABLET | Freq: Three times a day (TID) | ORAL | Status: DC

## 2016-04-13 NOTE — Telephone Encounter (Signed)
Normal Cr Dec 2016; rx approved

## 2016-04-24 ENCOUNTER — Encounter: Payer: Self-pay | Admitting: *Deleted

## 2016-04-24 ENCOUNTER — Encounter: Payer: Self-pay | Admitting: Family Medicine

## 2016-04-24 ENCOUNTER — Ambulatory Visit (INDEPENDENT_AMBULATORY_CARE_PROVIDER_SITE_OTHER): Payer: Commercial Managed Care - HMO | Admitting: Family Medicine

## 2016-04-24 VITALS — BP 121/62 | HR 60 | Temp 97.8°F | Resp 14 | Wt 178.0 lb

## 2016-04-24 DIAGNOSIS — I2510 Atherosclerotic heart disease of native coronary artery without angina pectoris: Secondary | ICD-10-CM | POA: Diagnosis not present

## 2016-04-24 DIAGNOSIS — Z5181 Encounter for therapeutic drug level monitoring: Secondary | ICD-10-CM | POA: Diagnosis not present

## 2016-04-24 DIAGNOSIS — Z720 Tobacco use: Secondary | ICD-10-CM

## 2016-04-24 DIAGNOSIS — K648 Other hemorrhoids: Secondary | ICD-10-CM

## 2016-04-24 DIAGNOSIS — Z1211 Encounter for screening for malignant neoplasm of colon: Secondary | ICD-10-CM

## 2016-04-24 DIAGNOSIS — E119 Type 2 diabetes mellitus without complications: Secondary | ICD-10-CM

## 2016-04-24 DIAGNOSIS — E875 Hyperkalemia: Secondary | ICD-10-CM

## 2016-04-24 DIAGNOSIS — E785 Hyperlipidemia, unspecified: Secondary | ICD-10-CM | POA: Diagnosis not present

## 2016-04-24 DIAGNOSIS — F17200 Nicotine dependence, unspecified, uncomplicated: Secondary | ICD-10-CM

## 2016-04-24 DIAGNOSIS — K644 Residual hemorrhoidal skin tags: Secondary | ICD-10-CM

## 2016-04-24 LAB — COMPLETE METABOLIC PANEL WITH GFR
ALT: 42 U/L (ref 9–46)
AST: 34 U/L (ref 10–35)
Albumin: 4.2 g/dL (ref 3.6–5.1)
Alkaline Phosphatase: 41 U/L (ref 40–115)
BUN: 16 mg/dL (ref 7–25)
CALCIUM: 9.2 mg/dL (ref 8.6–10.3)
CHLORIDE: 103 mmol/L (ref 98–110)
CO2: 27 mmol/L (ref 20–31)
Creat: 0.87 mg/dL (ref 0.70–1.33)
GFR, Est African American: >89 mL/min (ref >=60)
GFR, Est Non African American: >89 mL/min (ref >=60)
Glucose, Bld: 127 mg/dL — ABNORMAL HIGH (ref 65–99)
POTASSIUM: 5.2 mmol/L (ref 3.5–5.3)
SODIUM: 136 mmol/L (ref 135–146)
Total Bilirubin: 0.8 mg/dL (ref 0.2–1.2)
Total Protein: 6.7 g/dL (ref 6.1–8.1)

## 2016-04-24 LAB — CBC WITH DIFFERENTIAL/PLATELET
BASOS ABS: 0 {cells}/uL (ref 0–200)
Basophils Relative: 0 %
EOS PCT: 2 %
Eosinophils Absolute: 154 cells/uL (ref 15–500)
HCT: 39.2 % (ref 38.5–50.0)
Hemoglobin: 13.2 g/dL (ref 13.2–17.1)
Lymphocytes Relative: 21 %
Lymphs Abs: 1617 cells/uL (ref 850–3900)
MCH: 30.6 pg (ref 27.0–33.0)
MCHC: 33.7 g/dL (ref 32.0–36.0)
MCV: 91 fL (ref 80.0–100.0)
MONOS PCT: 7 %
MPV: 8.9 fL (ref 7.5–12.5)
Monocytes Absolute: 539 cells/uL (ref 200–950)
NEUTROS ABS: 5390 {cells}/uL (ref 1500–7800)
NEUTROS PCT: 70 %
Platelets: 207 10*3/uL (ref 140–400)
RBC: 4.31 MIL/uL (ref 4.20–5.80)
RDW: 13.2 % (ref 11.0–15.0)
WBC: 7.7 10*3/uL (ref 3.8–10.8)

## 2016-04-24 LAB — LIPID PANEL
CHOLESTEROL: 132 mg/dL (ref 125–200)
HDL: 44 mg/dL (ref >=40)
LDL CALC: 76 mg/dL (ref <130)
Total CHOL/HDL Ratio: 3 Ratio (ref <=5.0)
Triglycerides: 62 mg/dL (ref <150)
VLDL: 12 mg/dL (ref <30)

## 2016-04-24 NOTE — Assessment & Plan Note (Signed)
Encouraged him to quit smoking completely; see AVS

## 2016-04-24 NOTE — Patient Instructions (Addendum)
I do encourage you to quit smoking Call [phone] to sign up for smoking cessation classes You can call 1-800-QUIT-NOW to talk with a smoking cessation coach  Try to limit saturated fats in your diet (bologna, hot dogs, barbeque, cheeseburgers, hamburgers, steak, bacon, sausage, cheese, etc.) and get more fresh fruits, vegetables, and whole grains  Please do see your eye doctor regularly, and have your eyes examined every year (or more often per his or her recommendation) Check your feet every night and let me know right away of any sores, infections, numbness, etc. Try to limit sweets, white bread, white rice, white potatoes It is okay with me for you to not check your fingerstick blood sugars (per [employer] of Endocrinology Best Practices), unless you are interested and feel it would be helpful for you  We'll get labs today We'll refer you to Dr. Bary Castilla for the colonoscopy

## 2016-04-24 NOTE — Assessment & Plan Note (Signed)
Recheck today; avoiding fruit smoothies; avoid salt substitutes

## 2016-04-24 NOTE — Assessment & Plan Note (Signed)
Foot exam by MD; check A1c today; BP controlled; continue metformin, limit sugary things

## 2016-04-24 NOTE — Progress Notes (Signed)
BP 121/62 mmHg  Pulse 60  Temp(Src) 97.8 F (36.6 C) (Oral)  Resp 14  Wt 178 lb (80.74 kg)  SpO2 92%   Subjective:    Patient ID: Brian Dean, male    DOB: 08-22-1956, 60 y.o.   MRN: [id number]  HPI: Brian Dean is a 60 y.o. male  Chief Complaint  Patient presents with  . Follow-up   Patient is well-known to me  Type 2 diabetes; fissure on the right toe; some numbness; no dry mouth, no blurred vision; not getting up at night to urinate; on metformin; trying to eat a pretty good diet; does eat desserts on Sunday, not many sweets in general; no sugary drinks  Coronary artery disease; seeing cardiologist; stopped clopidogrel; on aspirin, statin; no chest pain  External hemorrhoids and needs colonoscopy; sometimes has bleeding; hemorrhoids have been there for a while; not constipated; last colonoscopy 2004  Hx of high potassium; still eats potatoes; no fruit smoothies any more  Blood pressure usually 130 range; today recheck by MD 121/62; BP at heart doctor was 114/64  Depression screen PHQ 2/9 04/24/2016  Decreased Interest 0  Down, Depressed, Hopeless 0  PHQ - 2 Score 0   Relevant past medical, surgical, family and social history reviewed Past Medical History  Diagnosis Date  . Hypertension   . Arthritis   . Syncope and collapse   . CAD (coronary artery disease) Nov 2014    diffuse, aggresive risk factor modification recommended  . Diabetes mellitus without complication ([place])   . Hyperlipidemia   . Hypercholesteremia   . Dyslipidemia   . GERD (gastroesophageal reflux disease)   . Tobacco use   . Hypertensive heart disease   . Unspecified systolic (congestive) heart failure ([place])   . ED (erectile dysfunction)   . Diabetic neuropathy ([place])   . Inguinal hernia   . History of acute pyelonephritis Aug. 2014    hospitalized; 100k E. coli pansensitive  . Sciatica    Past Surgical History  Procedure Laterality Date  . Elbow arthroscopy  2001    lt  .  Colonoscopy    . Gum surgery    . Wisdom tooth extraction    . Olecranon bursectomy  11/12/2011    Procedure: OLECRANON BURSA;  Surgeon: Linna Hoff;  Location: [place];  Service: Orthopedics;  Laterality: Right;  olecracnon bursectomy with delayed closure  . Cardiac catheterization  11/14    [facility] x1   Family History  Problem Relation Age of Onset  . Hypertension Father   . Diabetes Father   . Heart disease Father   . Diabetes Mother   . Heart disease Mother   . Hypertension Mother   . Cancer Mother     bladder  . Diabetes Sister   . Stroke Sister     mini stroke  . Diabetes Brother   . Diabetes Sister   . Diabetes Sister   . Diabetes Sister   . Diabetes Sister    Social History  Substance Use Topics  . Smoking status: Current Every Day Smoker -- 1.50 packs/day for 40 years    Types: Cigarettes    Last Attempt to Quit: 11/09/2012  . Smokeless tobacco: Never Used  . Alcohol Use: 0.0 oz/week    0 Standard drinks or equivalent per week     Comment: occasional  MD notes: social hx said former smoker; patient continues to smoke; soc hx updated  Interim medical history since last visit  reviewed. Allergies and medications reviewed  Review of Systems Per HPI unless specifically indicated above     Objective:    BP 121/62 mmHg  Pulse 60  Temp(Src) 97.8 F (36.6 C) (Oral)  Resp 14  Wt 178 lb (80.74 kg)  SpO2 92%  Wt Readings from Last 3 Encounters:  04/24/16 178 lb (80.74 kg)  10/25/15 180 lb (81.647 kg)  10/11/15 182 lb 4 oz (82.668 kg)    Today's Vitals   04/24/16 0851 04/24/16 0925 04/24/16 0928  BP: 106/48 121/62   Pulse: 92  60  Temp: 97.8 F (36.6 C)    TempSrc: Oral    Resp: 14    Weight: 178 lb (80.74 kg)    SpO2: 92%    recheck BP and pulse by MD, 121/62 and pulse 60, oxygen recheck 96%   Physical Exam  Constitutional: He appears well-developed and well-nourished. No distress.  HENT:  Head: Normocephalic and atraumatic.    Eyes: EOM are normal. No scleral icterus.  Neck: No thyromegaly present.  Cardiovascular: Normal rate and regular rhythm.   Pulmonary/Chest: Effort normal and breath sounds normal.  Abdominal: Soft. Bowel sounds are normal. He exhibits no distension.  Musculoskeletal: He exhibits no edema.  Neurological: Coordination normal.  Skin: Skin is warm and dry. No pallor.  Psychiatric: He has a normal mood and affect. His behavior is normal. Judgment and thought content normal.   Diabetic Foot Form - Detailed   Diabetic Foot Exam - detailed  Diabetic Foot exam was performed with the following findings:  Yes 04/24/2016  9:18 AM  Visual Foot Exam completed.:  Yes  Are the toenails long?:  No  Are the toenails thick?:  Yes  Are the toenails ingrown?:  No  Normal Range of Motion:  Yes    Pulse Foot Exam completed.:  Yes  Right Dorsalis Pedis:  Present Left Dorsalis Pedis:  Present  Sensory Foot Exam Completed.:  Yes  Swelling:  No  Semmes-Weinstein Monofilament Test  R Site 1-Great Toe:  Pos L Site 1-Great Toe:  Pos  R Site 4:  Neg L Site 4:  Neg  R Site 5:  Neg L Site 5:  Pos    Comments:  Fissure on the right great toe        Assessment & Plan:   Problem List Items Addressed This Visit      Cardiovascular and Mediastinum   CAD (coronary artery disease)    Continue aspirin, statin; seeing cardiologist        Endocrine   Diabetes mellitus without complication ([place]) - Primary    Foot exam by MD; check A1c today; BP controlled; continue metformin, limit sugary things      Relevant Orders   Hemoglobin A1c   Microalbumin / creatinine urine ratio     Other   Colon cancer screening   Relevant Orders   Ambulatory referral to General Surgery   Hyperkalemia    Recheck today; avoiding fruit smoothies; avoid salt substitutes      Hyperlipidemia    Avoid saturated fats, continue statin; recheck lipids today      Relevant Orders   Lipid Panel w/Direct LDL:HDL Ratio    Medication monitoring encounter    Check Cr and SGPT      Relevant Orders   CBC with Differential/Platelet   COMPLETE METABOLIC PANEL WITH GFR   Smoker    Encouraged him to quit smoking completely; see AVS       Other Visit  Diagnoses    External hemorrhoids        Relevant Orders    Ambulatory referral to General Surgery       Follow up plan: Return in about 6 months (around 10/24/2016) for labs and visit.  An after-visit summary was printed and given to the patient at [place].  Please see the patient instructions which may contain other information and recommendations beyond what is mentioned above in the assessment and plan.  Meds ordered this encounter  Medications  . gabapentin (NEURONTIN) 300 MG capsule    Sig: Take 300 mg by mouth at bedtime.    Refill:  2    Orders Placed This Encounter  Procedures  . Hemoglobin A1c  . CBC with Differential/Platelet  . Microalbumin / creatinine urine ratio  . COMPLETE METABOLIC PANEL WITH GFR  . Lipid Panel w/Direct LDL:HDL Ratio  . COMPLETE METABOLIC PANEL WITH GFR  . Microalbumin / creatinine urine ratio  . CBC with Differential/Platelet  . Lipid panel  . Hemoglobin A1c  . Ambulatory referral to General Surgery

## 2016-04-24 NOTE — Assessment & Plan Note (Signed)
Check Cr and SGPT 

## 2016-04-24 NOTE — Assessment & Plan Note (Signed)
Avoid saturated fats, continue statin; recheck lipids today

## 2016-04-24 NOTE — Assessment & Plan Note (Signed)
Continue aspirin, statin; seeing cardiologist

## 2016-04-25 ENCOUNTER — Encounter: Payer: Self-pay | Admitting: Family Medicine

## 2016-04-25 LAB — HEMOGLOBIN A1C
HEMOGLOBIN A1C: 6.6 % — AB (ref <5.7)
Mean Plasma Glucose: 143 mg/dL

## 2016-04-25 LAB — MICROALBUMIN / CREATININE URINE RATIO
Creatinine, Urine: 85 mg/dL (ref 20–370)
MICROALB UR: 0.4 mg/dL
MICROALB/CREAT RATIO: 5 ug/mg{creat} (ref <30)

## 2016-05-06 ENCOUNTER — Ambulatory Visit (INDEPENDENT_AMBULATORY_CARE_PROVIDER_SITE_OTHER): Payer: Commercial Managed Care - HMO | Admitting: General Surgery

## 2016-05-06 ENCOUNTER — Encounter: Payer: Self-pay | Admitting: General Surgery

## 2016-05-06 VITALS — BP 122/74 | HR 76 | Resp 12 | Ht 71.0 in | Wt 179.0 lb

## 2016-05-06 DIAGNOSIS — Z1211 Encounter for screening for malignant neoplasm of colon: Secondary | ICD-10-CM

## 2016-05-06 DIAGNOSIS — K625 Hemorrhage of anus and rectum: Secondary | ICD-10-CM

## 2016-05-06 DIAGNOSIS — K648 Other hemorrhoids: Secondary | ICD-10-CM | POA: Diagnosis not present

## 2016-05-06 LAB — POC HEMOCCULT BLD/STL (OFFICE/1-CARD/DIAGNOSTIC): FECAL OCCULT BLD: NEGATIVE

## 2016-05-06 MED ORDER — POLYETHYLENE GLYCOL 3350 17 GM/SCOOP PO POWD: ORAL | Status: DC

## 2016-05-06 NOTE — Progress Notes (Signed)
Patient ID: Brian Dean, male   DOB: 1956-06-27, 60 y.o.   MRN: [id number]  Chief Complaint  Patient presents with  . Other    hemorrhoids    HPI Brian Dean is a 60 y.o. male here today for a evaluation of hemorrhoids and screening colonoscopy. Patient states he is not having no GI problems at this time. Last colonoscopy was at [facility] in [facility]. Moves his bowel two times daily. He states he has seen bright red blood on the paper and in the bowl. No pain. Wife Brian Dean was present.   I personally reviewed the patient's history. HPI  Past Medical History  Diagnosis Date  . Hypertension   . Arthritis   . Syncope and collapse   . CAD (coronary artery disease) Nov 2014    diffuse, aggresive risk factor modification recommended  . Diabetes mellitus without complication ([place])   . Hyperlipidemia   . Hypercholesteremia   . Dyslipidemia   . GERD (gastroesophageal reflux disease)   . Tobacco use   . Hypertensive heart disease   . Unspecified systolic (congestive) heart failure ([place])   . ED (erectile dysfunction)   . Diabetic neuropathy ([place])   . Inguinal hernia   . History of acute pyelonephritis Aug. 2014    hospitalized; 100k E. coli pansensitive  . Sciatica     Past Surgical History  Procedure Laterality Date  . Elbow arthroscopy  2001    lt  . [facility]  [facility]  . Gum surgery    . Wisdom tooth extraction    . Olecranon bursectomy  11/12/2011    Procedure: OLECRANON BURSA;  Surgeon: Linna Hoff;  Location: [place];  Service: Orthopedics;  Laterality: Right;  olecracnon bursectomy with delayed closure  . Cardiac catheterization  11/14    [facility] x1    Family History  Problem Relation Age of Onset  . Hypertension Father   . Diabetes Father   . Heart disease Father   . Diabetes Mother   . Heart disease Mother   . Hypertension Mother   . Cancer Mother     bladder  . Diabetes Sister   . Stroke Sister     mini stroke  . Diabetes Brother   .  Diabetes Sister   . Diabetes Sister   . Diabetes Sister   . Diabetes Sister     Social History Social History  Substance Use Topics  . Smoking status: Current Every Day Smoker -- 0.50 packs/day for 40 years    Types: Cigarettes    Last Attempt to Quit: 11/09/2012  . Smokeless tobacco: Never Used  . Alcohol Use: 0.0 oz/week    0 Standard drinks or equivalent per week     Comment: occasional    No Known Allergies  Current Outpatient Prescriptions  Medication Sig Dispense Refill  . aspirin 81 MG tablet Take 162 mg by mouth daily. Reported on 05/06/2016    . atorvastatin (LIPITOR) 10 MG tablet Take 1 tablet (10 mg total) by mouth daily. 90 tablet 3  . CINNAMON PO Take 1,000 mg by mouth 3 (three) times daily. Reported on 05/06/2016    . finasteride (PROSCAR) 5 MG tablet Take 1 tablet (5 mg total) by mouth daily. 90 tablet 3  . gabapentin (NEURONTIN) 300 MG capsule Take 300 mg by mouth at bedtime. Reported on 05/06/2016  2  . lisinopril (PRINIVIL,ZESTRIL) 20 MG tablet Take 1 tablet (20 mg total) by mouth daily. [place]  tablet 3  . metFORMIN (GLUCOPHAGE) 500 MG tablet Take 1 tablet (500 mg total) by mouth 3 (three) times daily. Best if taken 15-30 minutes prior to meals 270 tablet 0  . nitroGLYCERIN (NITROSTAT) 0.4 MG SL tablet Place 1 tablet (0.4 mg total) under the tongue every 5 (five) minutes as needed. 25 tablet 3  . pantoprazole (PROTONIX) 40 MG tablet Take 1 tablet (40 mg total) by mouth daily. 30 tablet 6  . pyridOXINE (VITAMIN B-6) 100 MG tablet Take 100 mg by mouth daily. Reported on 05/06/2016    . tadalafil (CIALIS) 5 MG tablet Take 5 mg by mouth daily as needed for erectile dysfunction. Reported on 05/06/2016    . polyethylene glycol powder (GLYCOLAX/MIRALAX) powder 255 grams one bottle for colonoscopy prep 255 g 0   No current facility-administered medications for this visit.    Review of Systems Review of Systems  Constitutional: Negative.   Respiratory: Negative.    Cardiovascular: Negative.     Blood pressure 122/74, pulse 76, resp. rate 12, height 5\' 11"  (1.803 m), weight 179 lb (81.194 kg).  Physical Exam Physical Exam  Constitutional: He is oriented to person, place, and time. He appears well-developed and well-nourished.  Eyes: Conjunctivae are normal. No scleral icterus.  Neck: Neck supple.  Cardiovascular: Normal rate, regular rhythm and normal heart sounds.   Pulmonary/Chest: Effort normal and breath sounds normal.  Abdominal: Soft. Normal appearance and bowel sounds are normal. There is no hepatomegaly. There is no tenderness.  Genitourinary: Rectal exam shows internal hemorrhoid ( ). Rectal exam shows no fissure, no mass, no tenderness and anal tone normal. Guaiac negative stool.     Lymphadenopathy:    He has no cervical adenopathy.  Neurological: He is oriented to person, place, and time.  Skin: Skin is warm and dry.    Data Reviewed CBC and comprehensive metabolic panel from earlier this month reviewed. Normal renal function. Normal CBC.   Assessment    Internal hemorrhoids.  Candidate for screening colonoscopy.    Plan    Hemorrhoidal banding procedure reviewed.    Colonoscopy with possible biopsy/polypectomy prn: Information regarding the procedure, including its potential risks and complications (including but not limited to perforation of the bowel, which may require emergency surgery to repair, and bleeding) was verbally given to the patient. Educational information regarding lower intestinal endoscopy was given to the patient. Written instructions for how to complete the bowel prep using Miralax were provided. The importance of drinking ample fluids to avoid dehydration as a result of the prep emphasized.  Colonoscopy and hemorrhoid banding to be scheduled. Patient will be asked to hold Metformin day of prep and procedure.   PCP:  [place],  This information has been scribed by Gaspar Cola CMA.    Robert Bellow 05/07/2016, 6:42 AM

## 2016-05-06 NOTE — Patient Instructions (Signed)
Colonoscopy A colonoscopy is an exam to look at the entire large intestine (colon). This exam can help find problems such as tumors, polyps, inflammation, and areas of bleeding. The exam takes about 1 hour.  LET YOUR HEALTH CARE PROVIDER KNOW ABOUT:   Any allergies you have.  All medicines you are taking, including vitamins, herbs, eye drops, creams, and over-the-counter medicines.  Previous problems you or members of your family have had with the use of anesthetics.  Any blood disorders you have.  Previous surgeries you have had.  Medical conditions you have. RISKS AND COMPLICATIONS  Generally, this is a safe procedure. However, as with any procedure, complications can occur. Possible complications include:  Bleeding.  Tearing or rupture of the colon wall.  Reaction to medicines given during the exam.  Infection (rare). BEFORE THE PROCEDURE   Ask your health care provider about changing or stopping your regular medicines.  You may be prescribed an oral bowel prep. This involves drinking a large amount of medicated liquid, starting the day before your procedure. The liquid will cause you to have multiple loose stools until your stool is almost clear or light green. This cleans out your colon in preparation for the procedure.  Do not eat or drink anything else once you have started the bowel prep, unless your health care provider tells you it is safe to do so.  Arrange for someone to drive you home after the procedure. PROCEDURE   You will be given medicine to help you relax (sedative).  You will lie on your side with your knees bent.  A long, flexible tube with a light and camera on the end (colonoscope) will be inserted through the rectum and into the colon. The camera sends video back to a computer screen as it moves through the colon. The colonoscope also releases carbon dioxide gas to inflate the colon. This helps your health care provider see the area better.  During  the exam, your health care provider may take a small tissue sample (biopsy) to be examined under a microscope if any abnormalities are found.  The exam is finished when the entire colon has been viewed. AFTER THE PROCEDURE   Do not drive for 24 hours after the exam.  You may have a small amount of blood in your stool.  You may pass moderate amounts of gas and have mild abdominal cramping or bloating. This is caused by the gas used to inflate your colon during the exam.  Ask when your test results will be ready and how you will get your results. Make sure you get your test results.   This information is not intended to replace advice given to you by your health care provider. Make sure you discuss any questions you have with your health care provider.   Document Released: 10/23/2000 Document Revised: 08/16/2013 Document Reviewed: 07/03/2013 Elsevier Interactive Patient Education 2016 Elsevier Inc.  

## 2016-05-07 DIAGNOSIS — Z1211 Encounter for screening for malignant neoplasm of colon: Secondary | ICD-10-CM | POA: Insufficient documentation

## 2016-05-07 DIAGNOSIS — K648 Other hemorrhoids: Secondary | ICD-10-CM | POA: Insufficient documentation

## 2016-05-07 DIAGNOSIS — K625 Hemorrhage of anus and rectum: Secondary | ICD-10-CM | POA: Insufficient documentation

## 2016-05-13 ENCOUNTER — Telehealth: Payer: Self-pay | Admitting: *Deleted

## 2016-05-13 NOTE — Telephone Encounter (Signed)
Message left on home and cell numbers for patient to call the office.   We need to arrange a date for colonoscopy and hemorrhoid banding. It is okay for patient to continue 81 mg aspirin once daily. He will need to hold Metformin day of prep and procedure.

## 2016-05-13 NOTE — Telephone Encounter (Signed)
Patient has been scheduled for a colonoscopy and hemorrhoid banding on 06-22-16 at [facility]. He verbalizes understanding.

## 2016-06-02 ENCOUNTER — Other Ambulatory Visit: Payer: Self-pay | Admitting: General Surgery

## 2016-06-04 ENCOUNTER — Other Ambulatory Visit: Payer: Self-pay | Admitting: General Surgery

## 2016-06-04 DIAGNOSIS — Z1211 Encounter for screening for malignant neoplasm of colon: Secondary | ICD-10-CM

## 2016-06-18 ENCOUNTER — Encounter: Payer: Self-pay | Admitting: *Deleted

## 2016-06-22 ENCOUNTER — Ambulatory Visit
Admission: RE | Admit: 2016-06-22 | Discharge: 2016-06-22 | Disposition: A | Discharge status: Home / Self Care | Payer: Commercial Managed Care - HMO | Source: Ambulatory Visit | Attending: General Surgery | Admitting: General Surgery

## 2016-06-22 ENCOUNTER — Encounter
Admission: RE | Disposition: A | Discharge status: Home / Self Care | Payer: Self-pay | Source: Ambulatory Visit | Attending: General Surgery

## 2016-06-22 ENCOUNTER — Ambulatory Visit: Payer: Commercial Managed Care - HMO | Admitting: Anesthesiology

## 2016-06-22 ENCOUNTER — Encounter: Payer: Self-pay | Admitting: Anesthesiology

## 2016-06-22 DIAGNOSIS — N529 Male erectile dysfunction, unspecified: Secondary | ICD-10-CM | POA: Diagnosis not present

## 2016-06-22 DIAGNOSIS — I110 Hypertensive heart disease with heart failure: Secondary | ICD-10-CM | POA: Insufficient documentation

## 2016-06-22 DIAGNOSIS — I509 Heart failure, unspecified: Secondary | ICD-10-CM | POA: Diagnosis not present

## 2016-06-22 DIAGNOSIS — Z1211 Encounter for screening for malignant neoplasm of colon: Secondary | ICD-10-CM | POA: Diagnosis not present

## 2016-06-22 DIAGNOSIS — F17210 Nicotine dependence, cigarettes, uncomplicated: Secondary | ICD-10-CM | POA: Diagnosis not present

## 2016-06-22 DIAGNOSIS — K219 Gastro-esophageal reflux disease without esophagitis: Secondary | ICD-10-CM | POA: Insufficient documentation

## 2016-06-22 DIAGNOSIS — E785 Hyperlipidemia, unspecified: Secondary | ICD-10-CM | POA: Diagnosis not present

## 2016-06-22 DIAGNOSIS — Z79899 Other long term (current) drug therapy: Secondary | ICD-10-CM | POA: Diagnosis not present

## 2016-06-22 DIAGNOSIS — I2510 Atherosclerotic heart disease of native coronary artery without angina pectoris: Secondary | ICD-10-CM | POA: Insufficient documentation

## 2016-06-22 DIAGNOSIS — K641 Second degree hemorrhoids: Secondary | ICD-10-CM | POA: Insufficient documentation

## 2016-06-22 DIAGNOSIS — Z7984 Long term (current) use of oral hypoglycemic drugs: Secondary | ICD-10-CM | POA: Insufficient documentation

## 2016-06-22 DIAGNOSIS — K621 Rectal polyp: Secondary | ICD-10-CM | POA: Diagnosis not present

## 2016-06-22 DIAGNOSIS — D127 Benign neoplasm of rectosigmoid junction: Secondary | ICD-10-CM

## 2016-06-22 DIAGNOSIS — E1140 Type 2 diabetes mellitus with diabetic neuropathy, unspecified: Secondary | ICD-10-CM | POA: Diagnosis not present

## 2016-06-22 DIAGNOSIS — Z7982 Long term (current) use of aspirin: Secondary | ICD-10-CM | POA: Insufficient documentation

## 2016-06-22 DIAGNOSIS — E7800 Pure hypercholesterolemia, unspecified: Secondary | ICD-10-CM | POA: Insufficient documentation

## 2016-06-22 HISTORY — PX: COLONOSCOPY WITH PROPOFOL: SHX5780

## 2016-06-22 LAB — GLUCOSE, CAPILLARY: GLUCOSE-CAPILLARY: 108 mg/dL — AB (ref 65–99)

## 2016-06-22 SURGERY — COLONOSCOPY WITH PROPOFOL
Anesthesia: General

## 2016-06-22 MED ORDER — EPHEDRINE SULFATE 50 MG/ML IJ SOLN
INTRAMUSCULAR | Status: DC | PRN
  Administered 2016-06-22: 10 mg via INTRAVENOUS
  Administered 2016-06-22 (×2): 5 mg via INTRAVENOUS
  Administered 2016-06-22: 10 mg via INTRAVENOUS

## 2016-06-22 MED ORDER — SODIUM CHLORIDE 0.9 % IV SOLN: INTRAVENOUS | Status: DC

## 2016-06-22 MED ORDER — FENTANYL CITRATE (PF) 100 MCG/2ML IJ SOLN
INTRAMUSCULAR | Status: DC | PRN
  Administered 2016-06-22: 50 ug via INTRAVENOUS

## 2016-06-22 MED ORDER — SODIUM CHLORIDE 0.9 % IV SOLN
INTRAVENOUS | Status: DC
  Administered 2016-06-22: 09:00:00 via INTRAVENOUS

## 2016-06-22 MED ORDER — MIDAZOLAM HCL 2 MG/2ML IJ SOLN
INTRAMUSCULAR | Status: DC | PRN
  Administered 2016-06-22: 1 mg via INTRAVENOUS

## 2016-06-22 MED ORDER — PROPOFOL 500 MG/50ML IV EMUL
INTRAVENOUS | Status: DC | PRN
  Administered 2016-06-22: 100 ug/kg/min via INTRAVENOUS

## 2016-06-22 NOTE — Anesthesia Procedure Notes (Signed)
Performed by: COOK-MARTIN, Scarlettrose Costilow Pre-anesthesia Checklist: Patient identified, Emergency Drugs available, Suction available, Patient being monitored and Timeout performed Patient Re-evaluated:Patient Re-evaluated prior to inductionOxygen Delivery Method: Nasal cannula Preoxygenation: Pre-oxygenation with 100% oxygen Intubation Type: IV induction Placement Confirmation: positive ETCO2 and CO2 detector       

## 2016-06-22 NOTE — Anesthesia Procedure Notes (Signed)
Performed by: COOK-MARTIN, Shayne Diguglielmo Pre-anesthesia Checklist: Patient identified, Emergency Drugs available, Suction available, Patient being monitored and Timeout performed Patient Re-evaluated:Patient Re-evaluated prior to inductionOxygen Delivery Method: Nasal cannula Preoxygenation: Pre-oxygenation with 100% oxygen Intubation Type: IV induction Placement Confirmation: positive ETCO2 and CO2 detector       

## 2016-06-22 NOTE — H&P (Signed)
ZACKERI DOUCETTE [id number] 1955-11-22     HPI: Healthy 60 y/o male for screening colonoscopy and hemorrhoid banding. Modest bleeding from previously identified internal hemorrhoid during the prep.    Prescriptions Prior to Admission  Medication Sig Dispense Refill Last Dose  . aspirin 81 MG tablet Take 162 mg by mouth daily. Reported on 05/06/2016   06/21/2016 at Unknown time  . atorvastatin (LIPITOR) 10 MG tablet Take 1 tablet (10 mg total) by mouth daily. 90 tablet 3 06/21/2016 at Unknown time  . CINNAMON PO Take 1,000 mg by mouth 3 (three) times daily. Reported on 05/06/2016   Past Week at Unknown time  . finasteride (PROSCAR) 5 MG tablet Take 1 tablet (5 mg total) by mouth daily. 90 tablet 3 06/21/2016 at Unknown time  . gabapentin (NEURONTIN) 300 MG capsule Take 300 mg by mouth at bedtime. Reported on 05/06/2016  2 Past Week at Unknown time  . lisinopril (PRINIVIL,ZESTRIL) 20 MG tablet Take 1 tablet (20 mg total) by mouth daily. 90 tablet 3 06/22/2016 at 0700  . metFORMIN (GLUCOPHAGE) 500 MG tablet Take 1 tablet (500 mg total) by mouth 3 (three) times daily. Best if taken 15-30 minutes prior to meals 270 tablet 0 Past Week at Unknown time  . nitroGLYCERIN (NITROSTAT) 0.4 MG SL tablet Place 1 tablet (0.4 mg total) under the tongue every 5 (five) minutes as needed. 25 tablet 3 Past Month at Unknown time  . pantoprazole (PROTONIX) 40 MG tablet Take 1 tablet (40 mg total) by mouth daily. 30 tablet 6 06/21/2016 at Unknown time  . polyethylene glycol powder (GLYCOLAX/MIRALAX) powder 255 grams one bottle for colonoscopy prep 255 g 0 06/21/2016 at Unknown time  . pyridOXINE (VITAMIN B-6) 100 MG tablet Take 100 mg by mouth daily. Reported on 05/06/2016   06/21/2016 at Unknown time  . tadalafil (CIALIS) 5 MG tablet Take 5 mg by mouth daily as needed for erectile dysfunction. Reported on 05/06/2016   Past Week at Unknown time   No Known Allergies Past Medical History:  Diagnosis Date  . Arthritis   . CAD  (coronary artery disease) Nov 2014   diffuse, aggresive risk factor modification recommended  . Diabetes mellitus without complication ([place])   . Diabetic neuropathy ([place])   . Dyslipidemia   . ED (erectile dysfunction)   . GERD (gastroesophageal reflux disease)   . History of acute pyelonephritis Aug. 2014   hospitalized; 100k E. coli pansensitive  . Hypercholesteremia   . Hyperlipidemia   . Hypertension   . Hypertensive heart disease   . Inguinal hernia   . Sciatica   . Syncope and collapse   . Tobacco use   . Unspecified systolic (congestive) heart failure [facility])    Past Surgical History:  Procedure Laterality Date  . CARDIAC CATHETERIZATION  11/14   [facility] x1  . [facility]  [facility]   [facility]  . ELBOW ARTHROSCOPY  2001   lt  . GUM SURGERY    . OLECRANON BURSECTOMY  11/12/2011   Procedure: OLECRANON BURSA;  Surgeon: Linna Hoff;  Location: [place];  Service: Orthopedics;  Laterality: Right;  olecracnon bursectomy with delayed closure  . WISDOM TOOTH EXTRACTION     Social History   Social History  . Marital status: Married    Spouse name: N/A  . Number of children: N/A  . Years of education: N/A   Occupational History  . Not on file.   Social History Main Topics  . Smoking status: Current  Every Day Smoker    Packs/day: 0.50    Years: 40.00    Types: Cigarettes    Last attempt to quit: 11/09/2012  . Smokeless tobacco: Never Used  . Alcohol use 0.0 oz/week     Comment: occasional  . Drug use: No  . Sexual activity: Not on file   Other Topics Concern  . Not on file   Social History Narrative  . No narrative on file   Social History   Social History Narrative  . No narrative on file     ROS: Negative.     PE: HEENT: Negative. Lungs: Clear. Cardio: RR.  Assessment/Plan:  Proceed with planned endoscopy.  Robert Bellow 06/22/2016

## 2016-06-22 NOTE — Anesthesia Preprocedure Evaluation (Addendum)
Anesthesia Evaluation  Patient identified by MRN, date of birth, ID band Patient awake    Reviewed: Allergy & Precautions, NPO status , Patient's Chart, lab work & pertinent test results  History of Anesthesia Complications Negative for: history of anesthetic complications  Airway Mallampati: I   Neck ROM: Full    Dental  (+) Partial Lower, Partial Upper   Pulmonary Current Smoker,    breath sounds clear to auscultation       Cardiovascular hypertension, Pt. on medications + CAD and +CHF   Rhythm:Regular Rate:Normal     Neuro/Psych Sciatic nerve issues  Neuromuscular disease negative neurological ROS     GI/Hepatic Neg liver ROS, GERD  Medicated and Controlled,(+)     substance abuse  ,   Endo/Other  diabetes, Well Controlled, Type 2, Oral Hypoglycemic Agents  Renal/GU   negative genitourinary   Musculoskeletal  (+) Arthritis , Osteoarthritis,    Abdominal   Peds negative pediatric ROS (+)  Hematology negative hematology ROS (+)   Anesthesia Other Findings   Reproductive/Obstetrics                             Anesthesia Physical  Anesthesia Plan  ASA: III  Anesthesia Plan: General   Post-op Pain Management:    Induction: Intravenous  Airway Management Planned: Nasal Cannula  Additional Equipment:   Intra-op Plan:   Post-operative Plan:   Informed Consent: I have reviewed the patients History and Physical, chart, labs and discussed the procedure including the risks, benefits and alternatives for the proposed anesthesia with the patient or authorized representative who has indicated his/her understanding and acceptance.   Dental advisory given  Plan Discussed with: CRNA and Surgeon  Anesthesia Plan Comments:         Anesthesia Quick Evaluation

## 2016-06-22 NOTE — Op Note (Signed)
[facility] Gastroenterology Patient Name: Brian Dean Procedure Date: 06/22/2016 9:23 AM MRN: [id number] Account #: [id number] Date of Birth: 12-08-1955 Admit Type: Outpatient Age: 60 Room: [facility] ENDO ROOM 4 Gender: Male Note Status: Finalized Procedure:            Colonoscopy Indications:          Screening for colorectal malignant neoplasm Providers:            Robert Bellow, MD Referring MD:         Arnetha Courser (Referring MD) Medicines:            Monitored Anesthesia Care Complications:        No immediate complications. Procedure:            Pre-Anesthesia Assessment:                       - Prior to the procedure, a History and Physical was                        performed, and patient medications, allergies and                        sensitivities were reviewed. The patient's tolerance of                        previous anesthesia was reviewed.                       - The risks and benefits of the procedure and the                        sedation options and risks were discussed with the                        patient. All questions were answered and informed                        consent was obtained.                       After obtaining informed consent, the colonoscope was                        passed under direct vision. Throughout the procedure,                        the patient's blood pressure, pulse, and oxygen                        saturations were monitored continuously. The                        Colonoscope was introduced through the anus and                        advanced to the the cecum, identified by appendiceal                        orifice and ileocecal valve. The colonoscopy was  performed without difficulty. The patient tolerated the                        procedure well. The quality of the bowel preparation                        was excellent. Findings:      Eight sessile polyps were found in  the rectum (benign-appearing lesion)       and recto-sigmoid colon. The polyps were 5 mm in size. These were       biopsied with a cold forceps for histology.      Internal hemorrhoids were found during retroflexion and during anoscopy.       The hemorrhoids were moderate and Grade II (internal hemorrhoids that       prolapse but reduce spontaneously). Five bands were successfully placed       at the left lateral position and the right posterior position. There was       no bleeding during the procedure. Impression:           - Eight benign appearing 5 mm polyps in the rectum and                        at the recto-sigmoid colon. Biopsied.                       - Internal hemorrhoids. Banded. Recommendation:       - Telephone endoscopist for pathology results in 1 week. Procedure Code(s):    --- Professional ---                       [phone], Colonoscopy, flexible; with band ligation(s)                        (eg, hemorrhoids)                       45380, Colonoscopy, flexible; with biopsy, single or                        multiple Diagnosis Code(s):    --- Professional ---                       Z12.11, Encounter for screening for malignant neoplasm                        of colon                       K64.1, Second degree hemorrhoids                       K62.1, Rectal polyp                       D12.7, Benign neoplasm of rectosigmoid junction CPT copyright 2016 American Medical Association. All rights reserved. The codes documented in this report are preliminary and upon coder review may  be revised to meet current compliance requirements. Robert Bellow, MD 06/22/2016 10:07:25 AM This report has been signed electronically. Number of Addenda: 0 Note Initiated On: 06/22/2016 9:23 AM Scope Withdrawal Time: 0 hours 26 minutes 37 seconds  Total Procedure Duration: 0 hours  32 minutes 26 seconds       [facility]

## 2016-06-22 NOTE — Transfer of Care (Signed)
Immediate Anesthesia Transfer of Care Note  Patient: Brian Dean  Procedure(s) Performed: Procedure(s): COLONOSCOPY WITH PROPOFOL (N/A)  Patient Location: PACU  Anesthesia Type:General  Level of Consciousness: awake and sedated  Airway & Oxygen Therapy: Patient Spontanous Breathing and Patient connected to nasal cannula oxygen  Post-op Assessment: Report given to RN and Post -op Vital signs reviewed and stable  Post vital signs: Reviewed and stable  Last Vitals:  Vitals:   06/22/16 0843  BP: 95/60  Pulse: (!) 54  Resp: 16  Temp: (!) 35.6 C    Last Pain:  Vitals:   06/22/16 0843  TempSrc: Tympanic         Complications: No apparent anesthesia complications

## 2016-06-22 NOTE — Anesthesia Postprocedure Evaluation (Signed)
Anesthesia Post Note  Patient: Brian Dean  Procedure(s) Performed: Procedure(s) (LRB): COLONOSCOPY WITH PROPOFOL (N/A)  Patient location during evaluation: PACU Anesthesia Type: General Level of consciousness: awake and alert and oriented Pain management: pain level controlled Vital Signs Assessment: post-procedure vital signs reviewed and stable Respiratory status: spontaneous breathing Cardiovascular status: blood pressure returned to baseline Anesthetic complications: no    Last Vitals:  Vitals:   06/22/16 0843 06/22/16 1006  BP: 95/60 108/66  Pulse: (!) 54 64  Resp: 16 (!) 21  Temp: (!) 35.6 C (!) 35.9 C    Last Pain:  Vitals:   06/22/16 1006  TempSrc: Tympanic                 Ji Fairburn

## 2016-06-23 ENCOUNTER — Encounter: Payer: Self-pay | Admitting: General Surgery

## 2016-06-24 LAB — SURGICAL PATHOLOGY

## 2016-06-25 ENCOUNTER — Telehealth: Payer: Self-pay | Admitting: *Deleted

## 2016-06-25 NOTE — Telephone Encounter (Signed)
-----   Message from Robert Bellow, MD sent at 06/24/2016  4:38 PM EDT ----- Please notify the patient that the polyps removed at the time of Mondays colonoscopy were all benign. He should plan on a repeat exam in 10 chlor issue years. Thank you ----- Message ----- From: Interface, Lab In Three Zero One Sent: 06/24/2016  11:44 AM To: Robert Bellow, MD

## 2016-06-25 NOTE — Telephone Encounter (Signed)
Notified patient as instructed, patient pleased. Discussed follow-up appointments, patient agrees. Placed in recalls.  

## 2016-07-24 ENCOUNTER — Other Ambulatory Visit: Payer: Self-pay

## 2016-07-26 MED ORDER — METFORMIN HCL 500 MG PO TABS: 500.0000 mg | ORAL_TABLET | Freq: Three times a day (TID) | ORAL | 1 refills | Status: DC

## 2016-07-26 NOTE — Telephone Encounter (Signed)
Cr reviewed Rx approved 

## 2016-07-27 ENCOUNTER — Ambulatory Visit (INDEPENDENT_AMBULATORY_CARE_PROVIDER_SITE_OTHER): Payer: Commercial Managed Care - HMO | Admitting: General Surgery

## 2016-07-27 ENCOUNTER — Encounter: Payer: Self-pay | Admitting: General Surgery

## 2016-07-27 ENCOUNTER — Other Ambulatory Visit: Payer: Self-pay | Admitting: Cardiovascular Disease

## 2016-07-27 VITALS — BP 138/76 | HR 78 | Resp 14 | Ht 71.0 in | Wt 176.0 lb

## 2016-07-27 DIAGNOSIS — K625 Hemorrhage of anus and rectum: Secondary | ICD-10-CM

## 2016-07-27 NOTE — Patient Instructions (Signed)
Call with any bowel changes. 10 years for repeat colonoscopy.

## 2016-07-27 NOTE — Progress Notes (Signed)
Patient ID: Brian Dean, male   DOB: 15-Nov-1955, 60 y.o.   MRN: [id number]  Chief Complaint  Patient presents with  . Follow-up    colonoscopy and hemorrhoid banding.     HPI Brian Dean is a 60 y.o. male here today for his follow up colonoscopy and hemorrhoid banding done on 06/22/16. He states he has been feeling well.  HPI  Past Medical History:  Diagnosis Date  . Arthritis   . CAD (coronary artery disease) Nov 2014   diffuse, aggresive risk factor modification recommended  . Diabetes mellitus without complication ([place])   . Diabetic neuropathy ([place])   . Dyslipidemia   . ED (erectile dysfunction)   . GERD (gastroesophageal reflux disease)   . History of acute pyelonephritis Aug. 2014   hospitalized; 100k E. coli pansensitive  . Hypercholesteremia   . Hyperlipidemia   . Hypertension   . Hypertensive heart disease   . Inguinal hernia   . Sciatica   . Syncope and collapse   . Tobacco use   . Unspecified systolic (congestive) heart failure [facility])     Past Surgical History:  Procedure Laterality Date  . CARDIAC CATHETERIZATION  11/14   [facility] x1  . [facility]  . COLONOSCOPY WITH PROPOFOL N/A 06/22/2016   Procedure: COLONOSCOPY WITH PROPOFOL;  Surgeon: Robert Bellow, MD;  Location: [facility] ENDOSCOPY;  Service: Endoscopy;  Laterality: N/A;  . ELBOW ARTHROSCOPY  2001   lt  . GUM SURGERY    . OLECRANON BURSECTOMY  11/12/2011   Procedure: OLECRANON BURSA;  Surgeon: Linna Hoff;  Location: [place];  Service: Orthopedics;  Laterality: Right;  olecracnon bursectomy with delayed closure  . WISDOM TOOTH EXTRACTION      Family History  Problem Relation Age of Onset  . Hypertension Father   . Diabetes Father   . Heart disease Father   . Diabetes Mother   . Heart disease Mother   . Hypertension Mother   . Cancer Mother     bladder  . Diabetes Sister   . Stroke Sister     mini stroke  . Diabetes Brother   . Diabetes Sister   .  Diabetes Sister   . Diabetes Sister   . Diabetes Sister     Social History Social History  Substance Use Topics  . Smoking status: Current Every Day Smoker    Packs/day: 0.50    Years: 40.00    Types: Cigarettes    Last attempt to quit: 11/09/2012  . Smokeless tobacco: Never Used  . Alcohol use 0.0 oz/week     Comment: occasional    No Known Allergies  Current Outpatient Prescriptions  Medication Sig Dispense Refill  . aspirin 81 MG tablet Take 162 mg by mouth daily. Reported on 05/06/2016    . atorvastatin (LIPITOR) 10 MG tablet Take 1 tablet (10 mg total) by mouth daily. 90 tablet 3  . CINNAMON PO Take 1,000 mg by mouth 3 (three) times daily. Reported on 05/06/2016    . finasteride (PROSCAR) 5 MG tablet Take 1 tablet (5 mg total) by mouth daily. 90 tablet 3  . gabapentin (NEURONTIN) 300 MG capsule Take 300 mg by mouth at bedtime. Reported on 05/06/2016  2  . lisinopril (PRINIVIL,ZESTRIL) 20 MG tablet Take 1 tablet (20 mg total) by mouth daily. 90 tablet 3  . metFORMIN (GLUCOPHAGE) 500 MG tablet Take 1 tablet (500 mg total) by mouth 3 (three) times daily. Best  if taken 15-30 minutes prior to meals 270 tablet 1  . nitroGLYCERIN (NITROSTAT) 0.4 MG SL tablet Place 1 tablet (0.4 mg total) under the tongue every 5 (five) minutes as needed. 25 tablet 3  . pantoprazole (PROTONIX) 40 MG tablet TAKE 1 TABLET (40 MG TOTAL) BY MOUTH DAILY. 30 tablet 3  . pyridOXINE (VITAMIN B-6) 100 MG tablet Take 100 mg by mouth daily. Reported on 05/06/2016    . tadalafil (CIALIS) 5 MG tablet Take 5 mg by mouth daily as needed for erectile dysfunction. Reported on 05/06/2016     No current facility-administered medications for this visit.     Review of Systems Review of Systems  Constitutional: Negative.   Respiratory: Negative.   Cardiovascular: Negative.     Blood pressure 138/76, pulse 78, resp. rate 14, height 5\' 11"  (1.803 m), weight 176 lb (79.8 kg).  Physical Exam Physical Exam   Constitutional: He is oriented to person, place, and time. He appears well-developed and well-nourished.  Genitourinary: Rectal exam shows no internal hemorrhoid, no mass, no tenderness and anal tone normal.  Neurological: He is alert and oriented to person, place, and time.  Skin: Skin is warm and dry.    Data Reviewed Colonoscopy of 06/22/2016 showed multiple hyperplastic polyp in the rectum. Repeat exam in 10 years.  Internal hemorrhoids were banded, good results per patient report.  Assessment    Near complete resolution of previously noted noted post-defecation rectal bleeding.    Plan         Call with any bowel changes. 10 years for repeat colonoscopy.    This information has been scribed by Verlene Mayer, CMA     Robert Bellow 07/27/2016, 9:56 PM

## 2016-10-23 ENCOUNTER — Ambulatory Visit: Payer: Commercial Managed Care - HMO | Admitting: Family Medicine

## 2016-10-23 ENCOUNTER — Other Ambulatory Visit: Payer: Self-pay

## 2016-10-27 ENCOUNTER — Encounter: Payer: Self-pay | Admitting: Family Medicine

## 2016-10-27 ENCOUNTER — Ambulatory Visit (INDEPENDENT_AMBULATORY_CARE_PROVIDER_SITE_OTHER): Payer: Commercial Managed Care - HMO | Admitting: Family Medicine

## 2016-10-27 DIAGNOSIS — Z5181 Encounter for therapeutic drug level monitoring: Secondary | ICD-10-CM

## 2016-10-27 DIAGNOSIS — K625 Hemorrhage of anus and rectum: Secondary | ICD-10-CM | POA: Diagnosis not present

## 2016-10-27 DIAGNOSIS — I2510 Atherosclerotic heart disease of native coronary artery without angina pectoris: Secondary | ICD-10-CM

## 2016-10-27 DIAGNOSIS — E782 Mixed hyperlipidemia: Secondary | ICD-10-CM

## 2016-10-27 DIAGNOSIS — E119 Type 2 diabetes mellitus without complications: Secondary | ICD-10-CM

## 2016-10-27 DIAGNOSIS — F17200 Nicotine dependence, unspecified, uncomplicated: Secondary | ICD-10-CM | POA: Diagnosis not present

## 2016-10-27 DIAGNOSIS — E1142 Type 2 diabetes mellitus with diabetic polyneuropathy: Secondary | ICD-10-CM | POA: Diagnosis not present

## 2016-10-27 LAB — CBC WITH DIFFERENTIAL/PLATELET
Basophils Absolute: 0 cells/uL (ref 0–200)
Basophils Relative: 0 %
EOS PCT: 1 %
Eosinophils Absolute: 70 cells/uL (ref 15–500)
HCT: 41.6 % (ref 38.5–50.0)
HEMOGLOBIN: 13.7 g/dL (ref 13.2–17.1)
Lymphocytes Relative: 23 %
Lymphs Abs: 1610 cells/uL (ref 850–3900)
MCH: 30.6 pg (ref 27.0–33.0)
MCHC: 32.9 g/dL (ref 32.0–36.0)
MCV: 92.9 fL (ref 80.0–100.0)
MONO ABS: 630 {cells}/uL (ref 200–950)
MPV: 9.1 fL (ref 7.5–12.5)
Monocytes Relative: 9 %
NEUTROS PCT: 67 %
Neutro Abs: 4690 cells/uL (ref 1500–7800)
Platelets: 217 10*3/uL (ref 140–400)
RBC: 4.48 MIL/uL (ref 4.20–5.80)
RDW: 12.9 % (ref 11.0–15.0)
WBC: 7 10*3/uL (ref 3.8–10.8)

## 2016-10-27 LAB — COMPLETE METABOLIC PANEL WITH GFR
ALBUMIN: 4.4 g/dL (ref 3.6–5.1)
ALK PHOS: 41 U/L (ref 40–115)
ALT: 35 U/L (ref 9–46)
AST: 29 U/L (ref 10–35)
BUN: 14 mg/dL (ref 7–25)
CO2: 26 mmol/L (ref 20–31)
Calcium: 9.2 mg/dL (ref 8.6–10.3)
Chloride: 103 mmol/L (ref 98–110)
Creat: 0.99 mg/dL (ref 0.70–1.25)
GFR, Est African American: >89 mL/min (ref >=60)
GFR, Est Non African American: 82 mL/min (ref >=60)
GLUCOSE: 126 mg/dL — AB (ref 65–99)
POTASSIUM: 5.1 mmol/L (ref 3.5–5.3)
SODIUM: 137 mmol/L (ref 135–146)
Total Bilirubin: 0.9 mg/dL (ref 0.2–1.2)
Total Protein: 7.3 g/dL (ref 6.1–8.1)

## 2016-10-27 LAB — LIPID PANEL
CHOL/HDL RATIO: 3.3 ratio (ref <5.0)
CHOLESTEROL: 129 mg/dL (ref <200)
HDL: 39 mg/dL — AB (ref >40)
LDL Cholesterol: 74 mg/dL (ref <100)
Triglycerides: 78 mg/dL (ref <150)
VLDL: 16 mg/dL (ref <30)

## 2016-10-27 LAB — HEMOGLOBIN A1C
Hgb A1c MFr Bld: 6.1 % — ABNORMAL HIGH (ref <5.7)
MEAN PLASMA GLUCOSE: 128 mg/dL

## 2016-10-27 NOTE — Assessment & Plan Note (Signed)
Advised to quit; he is not quite ready to quit

## 2016-10-27 NOTE — Patient Instructions (Addendum)
Try to limit to no more than three egg yolks a week Try to limit saturated fats in your diet (bologna, hot dogs, barbeque, cheeseburgers, hamburgers, steak, bacon, sausage, cheese, etc.) and get more fresh fruits, vegetables, and whole grains I do encourage you to quit smoking Call [phone] to sign up for smoking cessation classes You can call 1-800-QUIT-NOW to talk with a smoking cessation coach   Steps to Quit Smoking Smoking tobacco can be bad for your health. It can also affect almost every organ in your body. Smoking puts you and people around you at risk for many serious long-lasting (chronic) diseases. Quitting smoking is hard, but it is one of the best things that you can do for your health. It is never too late to quit. What are the benefits of quitting smoking? When you quit smoking, you lower your risk for getting serious diseases and conditions. They can include:  Lung cancer or lung disease.  Heart disease.  Stroke.  Heart attack.  Not being able to have children (infertility).  Weak bones (osteoporosis) and broken bones (fractures). If you have coughing, wheezing, and shortness of breath, those symptoms may get better when you quit. You may also get sick less often. If you are pregnant, quitting smoking can help to lower your chances of having a baby of low birth weight. What can I do to help me quit smoking? Talk with your doctor about what can help you quit smoking. Some things you can do (strategies) include:  Quitting smoking totally, instead of slowly cutting back how much you smoke over a period of time.  Going to in-person counseling. You are more likely to quit if you go to many counseling sessions.  Using resources and support systems, such as:  Online chats with a Social worker.  Phone quitlines.  Printed Furniture conservator/restorer.  Support groups or group counseling.  Text messaging programs.  Mobile phone apps or applications.  Taking medicines. Some  of these medicines may have nicotine in them. If you are pregnant or breastfeeding, do not take any medicines to quit smoking unless your doctor says it is okay. Talk with your doctor about counseling or other things that can help you. Talk with your doctor about using more than one strategy at the same time, such as taking medicines while you are also going to in-person counseling. This can help make quitting easier. What things can I do to make it easier to quit? Quitting smoking might feel very hard at first, but there is a lot that you can do to make it easier. Take these steps:  Talk to your family and friends. Ask them to support and encourage you.  Call phone quitlines, reach out to support groups, or work with a Social worker.  Ask people who smoke to not smoke around you.  Avoid places that make you want (trigger) to smoke, such as:  Bars.  Parties.  Smoke-break areas at work.  Spend time with people who do not smoke.  Lower the stress in your life. Stress can make you want to smoke. Try these things to help your stress:  Getting regular exercise.  Deep-breathing exercises.  Yoga.  Meditating.  Doing a body scan. To do this, close your eyes, focus on one area of your body at a time from head to toe, and notice which parts of your body are tense. Try to relax the muscles in those areas.  Download or buy apps on your mobile phone or tablet that can help  you stick to your quit plan. There are many free apps, such as QuitGuide from the [employer] Office manager for Disease Control and Prevention). You can find more support from smokefree.gov and other websites. This information is not intended to replace advice given to you by your health care provider. Make sure you discuss any questions you have with your health care provider. Document Released: 08/22/2009 Document Revised: 06/23/2016 Document Reviewed: 03/12/2015 Elsevier Interactive Patient Education  2017 [employer].

## 2016-10-27 NOTE — Assessment & Plan Note (Signed)
Check lipids; advised to limit saturated fats; continue statin

## 2016-10-27 NOTE — Assessment & Plan Note (Signed)
Check A1c; foot exam by MD today; patient to check feet every night; encouraged to schedule eye exam soon

## 2016-10-27 NOTE — Assessment & Plan Note (Signed)
Check cmp 

## 2016-10-27 NOTE — Assessment & Plan Note (Signed)
Encouraged patient to check his own feet every night; would not take much for a sore to develop which could lead to infection, loss of digit

## 2016-10-27 NOTE — Assessment & Plan Note (Signed)
Followed by Dr. Rockey Situ; advised to quit smoking; continue aspirin

## 2016-10-27 NOTE — Progress Notes (Signed)
BP 124/84   Pulse 66   Temp 97.9 F (36.6 C) (Oral)   Resp 14   Wt 182 lb (82.6 kg)   SpO2 97%   BMI 25.38 kg/m    Subjective:    Patient ID: Brian Dean, male    DOB: 05/11/1956, 60 y.o.   MRN: [id number]  HPI: Brian Dean is a 60 y.o. male  Chief Complaint  Patient presents with  . Follow-up   Patient is here for 6 month f/u Type 2 diabetes; numbness in the great toes; no ulcers of the feet; not checking sugars with my blessing Last A1c 6.6 in June  HTN; well-controlled  High cholesterol; last cholesterol panel was very good, goal LDL less than 70 and he was at 76; room for improvement in his diet; eats four eggs a week  Hx of heart attack; no chest pain; sees Dr. Rockey Situ, cardiologist Getting ready to retire Smokes 3-4 cigarettes a day; not quite ready to quit  He had rectal bleeding earlier; saw surgeon, underwent colonoscopy; had 8-9 polyps removed and hemorrhoids banded; bleeding has completely resolved now  Depression screen [facility] 2/9 10/27/2016 04/24/2016  Decreased Interest 0 0  Down, Depressed, Hopeless 0 0  PHQ - 2 Score 0 0   Relevant past medical, surgical, family and social history reviewed Past Medical History:  Diagnosis Date  . Arthritis   . CAD (coronary artery disease) Nov 2014   diffuse, aggresive risk factor modification recommended  . Diabetes mellitus without complication ([place])   . Diabetic neuropathy ([place])   . Dyslipidemia   . ED (erectile dysfunction)   . GERD (gastroesophageal reflux disease)   . History of acute pyelonephritis Aug. 2014   hospitalized; 100k E. coli pansensitive  . Hypercholesteremia   . Hyperlipidemia   . Hypertension   . Hypertensive heart disease   . Inguinal hernia   . Sciatica   . Syncope and collapse   . Tobacco use   . Unspecified systolic (congestive) heart failure [facility])    Past Surgical History:  Procedure Laterality Date  . CARDIAC CATHETERIZATION  11/14   [facility] x1  . [facility]   . COLONOSCOPY WITH PROPOFOL N/A 06/22/2016   Procedure: COLONOSCOPY WITH PROPOFOL;  Surgeon: Robert Bellow, MD;  Location: [facility] ENDOSCOPY;  Service: Endoscopy;  Laterality: N/A;  . ELBOW ARTHROSCOPY  2001   lt  . GUM SURGERY    . OLECRANON BURSECTOMY  11/12/2011   Procedure: OLECRANON BURSA;  Surgeon: Linna Hoff;  Location: [place];  Service: Orthopedics;  Laterality: Right;  olecracnon bursectomy with delayed closure  . WISDOM TOOTH EXTRACTION     Family History  Problem Relation Age of Onset  . Hypertension Father   . Diabetes Father   . Heart disease Father   . Diabetes Mother   . Heart disease Mother   . Hypertension Mother   . Cancer Mother     bladder  . Diabetes Sister   . Stroke Sister     mini stroke  . Diabetes Brother   . Diabetes Sister   . Diabetes Sister   . Diabetes Sister   . Diabetes Sister    Social History  Substance Use Topics  . Smoking status: Current Every Day Smoker    Packs/day: 0.50    Years: 40.00    Types: Cigarettes    Last attempt to quit: 11/09/2012  . Smokeless tobacco: Never Used  . Alcohol  use 0.0 oz/week     Comment: occasional  MD note: smoking about 3-4 cigarettes a day  Interim medical history since last visit reviewed. Allergies and medications reviewed  Review of Systems Per HPI unless specifically indicated above     Objective:    BP 124/84   Pulse 66   Temp 97.9 F (36.6 C) (Oral)   Resp 14   Wt 182 lb (82.6 kg)   SpO2 97%   BMI 25.38 kg/m   Wt Readings from Last 3 Encounters:  10/27/16 182 lb (82.6 kg)  07/27/16 176 lb (79.8 kg)  06/22/16 175 lb (79.4 kg)    Physical Exam  Constitutional: He appears well-developed and well-nourished. No distress.  HENT:  Head: Normocephalic and atraumatic.  Eyes: EOM are normal. No scleral icterus.  Neck: No thyromegaly present.  Cardiovascular: Normal rate and regular rhythm.   Pulmonary/Chest: Effort normal and breath sounds normal.  Abdominal:  Soft. Bowel sounds are normal. He exhibits no distension.  Musculoskeletal: He exhibits no edema.  Neurological: He is alert. He displays no tremor. Gait normal.  Skin: Skin is warm and dry. No pallor.  Psychiatric: He has a normal mood and affect. His behavior is normal. Judgment and thought content normal.   Diabetic Foot Form - Detailed   Diabetic Foot Exam - detailed Diabetic Foot exam was performed with the following findings:  Yes 10/27/2016  9:43 AM  Visual Foot Exam completed.:  Yes  Are the toenails long?:  No Are the toenails ingrown?:  No Normal Range of Motion:  Yes Pulse Foot Exam completed.:  Yes  Right Dorsalis Pedis:  Present Left Dorsalis Pedis:  Present  Sensory Foot Exam Completed.:  Yes Swelling:  No Semmes-Weinstein Monofilament Test R Site 1-Great Toe:  Neg L Site 1-Great Toe:  Neg  R Site 4:  Pos L Site 4:  Pos  R Site 5:  Pos L Site 5:  Pos       Results for orders placed or performed during the hospital encounter of 06/22/16  Glucose, capillary  Result Value Ref Range   Glucose-Capillary 108 (H) 65 - 99 mg/dL   Comment 1 IN EPIC   Surgical pathology  Result Value Ref Range   SURGICAL PATHOLOGY      Surgical Pathology CASE: [id number] PATIENT: Brian Dean Surgical Pathology Report     SPECIMEN SUBMITTED: A. Colon polyp x3, sigmoid; cbx B. Rectum polyp x2, upper; cbx C. Rectum polyp x3, lower; cbx  CLINICAL HISTORY: None provided  PRE-OPERATIVE DIAGNOSIS: Rectal bleed  POST-OPERATIVE DIAGNOSIS: Hemorrhoids, colon polyps     DIAGNOSIS: A. COLON POLYP 3, SIGMOID; COLD BIOPSY: - HYPERPLASTIC POLYP (3). - NEGATIVE FOR DYSPLASIA AND MALIGNANCY.  B. RECTUM POLYP 2, UPPER; COLD BIOPSY: - HYPERPLASTIC POLYP (2). - NEGATIVE FOR DYSPLASIA AND MALIGNANCY.  C. RECTUM POLYP 3, LOWER; COLD BIOPSY: - HYPERPLASTIC POLYP (3). - NEGATIVE FOR DYSPLASIA AND MALIGNANCY.     GROSS DESCRIPTION:  A. Labeled: sigmoid colon polyp x 3 C  BX  Tissue fragment(s): 4  Size: 0.3-0.5 cm  Description: tan  Entirely submitted in 1 cassette(s).   B. Labeled: upper rectum polyp C BX 2  Tissue fragment(s): 2  Size: 0.3 and 0.4 cm  Description: pink-tan   Entirely submitted in 1 cassette(s).  C. Labeled: lower rectum polyp C BX 3  Tissue fragment(s): 3  Size: 0.3-0.4 cm  Description: pink-tan  Entirely submitted in 1 cassette(s).  Final Diagnosis performed by Quay Burow, MD.  Electronically  signed 06/24/2016 11:29:09AM    The electronic signature indicates that the named Attending Pathologist has evaluated the specimen  Technical component performed at [facility], [address], [place], [place] [address] Lab: [phone] Dir: Darrick Penna. Evette Doffing, MD  Professional component performed at [facility], [facility], [place], [place], [place] [address] Lab: [phone] Dir: Dellia Nims. Rubinas, MD        Assessment & Plan:   Problem List Items Addressed This Visit      Cardiovascular and Mediastinum   CAD (coronary artery disease) (Chronic)    Followed by Dr. Rockey Situ; advised to quit smoking; continue aspirin      Relevant Orders   CBC with Differential/Platelet     Digestive   Rectal bleeding    S/p banding of hemorrhoids and polypectomies; resolved        Endocrine   Diabetic neuropathy ([place]) (Chronic)    Encouraged patient to check his own feet every night; would not take much for a sore to develop which could lead to infection, loss of digit      Diabetes mellitus (H[facility]) (Chronic)    Check A1c; foot exam by MD today; patient to check feet every night; encouraged to schedule eye exam soon      Relevant Orders   Hemoglobin A1c     Other   Smoker    Advised to quit; he is not quite ready to quit      Medication monitoring encounter    Check cmp      Relevant Orders   CBC with Differential/Platelet   COMPLETE METABOLIC PANEL WITH GFR   Hyperlipidemia    Check  lipids; advised to limit saturated fats; continue statin      Relevant Orders   Lipid panel      Follow up plan: Return in about 6 months (around 04/27/2017) for fasting labs and visit.  An after-visit summary was printed and given to the patient at [facility].  Please see the patient instructions which may contain other information and recommendations beyond what is mentioned above in the assessment and plan.  No orders of the defined types were placed in this encounter.  Orders Placed This Encounter  Procedures  . Hemoglobin A1c  . Lipid panel  . CBC with Differential/Platelet  . COMPLETE METABOLIC PANEL WITH GFR

## 2016-10-27 NOTE — Assessment & Plan Note (Signed)
S/p banding of hemorrhoids and polypectomies; resolved

## 2016-10-31 ENCOUNTER — Other Ambulatory Visit: Payer: Self-pay | Admitting: Cardiovascular Disease

## 2016-11-14 ENCOUNTER — Other Ambulatory Visit: Payer: Self-pay | Admitting: Cardiovascular Disease

## 2016-11-16 ENCOUNTER — Other Ambulatory Visit: Payer: Self-pay | Admitting: Cardiovascular Disease

## 2016-11-16 NOTE — Telephone Encounter (Signed)
Pt needs f/u appt with Dr. Rockey Situ for further refills. Thanks.

## 2016-11-16 NOTE — Telephone Encounter (Signed)
Patient called back he is coming 11/23/16 to see Dr Rockey Situ

## 2016-11-16 NOTE — Telephone Encounter (Signed)
Lmov for patient to call back and schedule follow up appointment with Dr Rockey Situ

## 2016-11-17 NOTE — Telephone Encounter (Signed)
Pt has upcoming appointment with [facility]. Pt has not been seen since 2016 requesting Rx ok to refill?

## 2016-11-17 NOTE — Telephone Encounter (Signed)
Routed to Dr. Rockey Situ for approval

## 2016-11-19 ENCOUNTER — Other Ambulatory Visit: Payer: Self-pay

## 2016-11-19 MED ORDER — LISINOPRIL 20 MG PO TABS: 20.0000 mg | ORAL_TABLET | Freq: Every day | ORAL | 0 refills | Status: DC

## 2016-11-23 ENCOUNTER — Ambulatory Visit (INDEPENDENT_AMBULATORY_CARE_PROVIDER_SITE_OTHER): Payer: 59 | Admitting: Cardiovascular Disease

## 2016-11-23 ENCOUNTER — Encounter: Payer: Self-pay | Admitting: Cardiovascular Disease

## 2016-11-23 VITALS — BP 120/76 | HR 56 | Ht 71.0 in | Wt 183.8 lb

## 2016-11-23 DIAGNOSIS — E119 Type 2 diabetes mellitus without complications: Secondary | ICD-10-CM | POA: Diagnosis not present

## 2016-11-23 DIAGNOSIS — I119 Hypertensive heart disease without heart failure: Secondary | ICD-10-CM | POA: Diagnosis not present

## 2016-11-23 DIAGNOSIS — F17200 Nicotine dependence, unspecified, uncomplicated: Secondary | ICD-10-CM

## 2016-11-23 DIAGNOSIS — E782 Mixed hyperlipidemia: Secondary | ICD-10-CM

## 2016-11-23 DIAGNOSIS — I2510 Atherosclerotic heart disease of native coronary artery without angina pectoris: Secondary | ICD-10-CM | POA: Diagnosis not present

## 2016-11-23 DIAGNOSIS — Z955 Presence of coronary angioplasty implant and graft: Secondary | ICD-10-CM

## 2016-11-23 MED ORDER — LISINOPRIL 20 MG PO TABS: 20.0000 mg | ORAL_TABLET | Freq: Every day | ORAL | 3 refills | Status: DC

## 2016-11-23 MED ORDER — ATORVASTATIN CALCIUM 10 MG PO TABS: 10.0000 mg | ORAL_TABLET | Freq: Every day | ORAL | 3 refills | Status: DC

## 2016-11-23 NOTE — Patient Instructions (Signed)

## 2016-11-23 NOTE — Progress Notes (Signed)
Cardiology Office Note  Date:  11/23/2016   ID:  Brian Dean, DOB [birth year], MRN [id number]  PCP:  Enid Derry, MD   Chief Complaint  Patient presents with  . other    12 month follow up. Meds reviewed by the pt. verbally. "doing well."     HPI:  Brian Dean is a very pleasant 61 year old gentleman with a history of diabetes, hypertension, long smoking history ,  previous episodes of chest pain , CT scan of his abdomen showing large gallstone, prostate issues who is scheduled to have a prostate procedure,  Positive stress test, who underwent cardiac catheterization who presents for routine followup of his coronary artery disease Cardiac catheterization showed severe proximal left circumflex disease, 50% lesions in his LAD, 40% RCA disease. Stent placed to his proximal left circumflex. Stent placed 09/25/2013  In follow-up today he reports that he continues to use vapor cigarette, smokes 3 cigarettes per day, sometimes marijuana Previously stopped Plavix secondary to excessive bruising Takes 2 baby aspirins per day He is retiring in several weeks' time Denies any chest pain, shortness of breath No regular exercise program  EKG shows normal sinus rhythm with rate 56 bpm, interventricular conduction delay, left anterior fascicular block, no change compared to prior EKGs  Other past medical history Previous Stress Myoview showed what appears to be a proximally occluded RCA with peri-infarct ischemia.   Left anterior descending and left circumflex territory appear intact with no perfusion abnormality. Ejection fraction 47% with wall motion abnormality in the inferior wall.  Cardiac catheterization performed as above  Notes indicate admission from August 30 to 07/09/2013. He was admitted for acute pyelonephritis, acute on chronic low back pain, hyponatremia, diabetes, hypertension  CT scan 07/07/2013 shows large calcified stone in the gallbladder neck, enlarged prostate He  denies having any symptoms from his gallbladder stones Recent lab work showing total cholesterol 144, LDL 80, HDL 39    PMH:   has a past medical history of Arthritis; CAD (coronary artery disease) (Nov 2014); Diabetes mellitus without complication ([place]); Diabetic neuropathy ([place]); Dyslipidemia; ED (erectile dysfunction); GERD (gastroesophageal reflux disease); History of acute pyelonephritis (Aug. 2014); Hypercholesteremia; Hyperlipidemia; Hypertension; Hypertensive heart disease; Inguinal hernia; Sciatica; Syncope and collapse; Tobacco use; and Unspecified systolic (congestive) heart failure ([place]).  PSH:    Past Surgical History:  Procedure Laterality Date  . CARDIAC CATHETERIZATION  11/14   [facility] x1  . [facility]  . COLONOSCOPY WITH PROPOFOL N/A 06/22/2016   Procedure: COLONOSCOPY WITH PROPOFOL;  Surgeon: Robert Bellow, MD;  Location: [facility] ENDOSCOPY;  Service: Endoscopy;  Laterality: N/A;  . ELBOW ARTHROSCOPY  2001   lt  . GUM SURGERY    . OLECRANON BURSECTOMY  11/12/2011   Procedure: OLECRANON BURSA;  Surgeon: Linna Hoff;  Location: [facility];  Service: Orthopedics;  Laterality: Right;  olecracnon bursectomy with delayed closure  . WISDOM TOOTH EXTRACTION      Current Outpatient Prescriptions  Medication Sig Dispense Refill  . aspirin 81 MG tablet Take 162 mg by mouth daily. Reported on 05/06/2016    . atorvastatin (LIPITOR) 10 MG tablet Take 1 tablet (10 mg total) by mouth daily. 90 tablet 3  . CINNAMON PO Take 1,000 mg by mouth 3 (three) times daily. Reported on 05/06/2016    . finasteride (PROSCAR) 5 MG tablet Take 1 tablet (5 mg total) by mouth daily. 90 tablet 3  . gabapentin (NEURONTIN) 300 MG capsule Take 300 mg by  mouth at bedtime. Reported on 05/06/2016  2  . lisinopril (PRINIVIL,ZESTRIL) 20 MG tablet Take 1 tablet (20 mg total) by mouth daily. 90 tablet 3  . metFORMIN (GLUCOPHAGE) 500 MG tablet Take 1 tablet (500 mg total) by mouth 3  (three) times daily. Best if taken 15-30 minutes prior to meals 270 tablet 1  . nitroGLYCERIN (NITROSTAT) 0.4 MG SL tablet Place 1 tablet (0.4 mg total) under the tongue every 5 (five) minutes as needed. 25 tablet 3  . pantoprazole (PROTONIX) 40 MG tablet TAKE 1 TABLET (40 MG TOTAL) BY MOUTH DAILY. 30 tablet 3  . pyridOXINE (VITAMIN B-6) 100 MG tablet Take 100 mg by mouth daily. Reported on 05/06/2016    . tadalafil (CIALIS) 5 MG tablet Take 5 mg by mouth daily as needed for erectile dysfunction. Reported on 05/06/2016     No current facility-administered medications for this visit.      Allergies:   Patient has no known allergies.   Social History:  The patient  reports that he has been smoking Cigarettes.  He has a 20.00 pack-year smoking history. He has never used smokeless tobacco. He reports that he drinks alcohol. He reports that he does not use drugs.   Family History:   family history includes Cancer in his mother; Diabetes in his brother, father, mother, sister, sister, sister, sister, and sister; Heart disease in his father and mother; Hypertension in his father and mother; Stroke in his sister.    Review of Systems: Review of Systems  Constitutional: Negative.   Respiratory: Negative.   Cardiovascular: Negative.   Gastrointestinal: Negative.   Musculoskeletal: Negative.   Neurological: Negative.   Psychiatric/Behavioral: Negative.   All other systems reviewed and are negative.    PHYSICAL EXAM: VS:  BP 120/76 (BP Location: Left Arm, Patient Position: Sitting, Cuff Size: Normal)   Pulse (!) 56   Ht 5\' 11"  (1.803 m)   Wt 183 lb 12 oz (83.3 kg)   BMI 25.63 kg/m  , BMI Body mass index is 25.63 kg/m. GEN: Well nourished, well developed, in no acute distress  HEENT: normal  Neck: no JVD, carotid bruits, or masses Cardiac: RRR; no murmurs, rubs, or gallops,no edema  Respiratory:  clear to auscultation bilaterally, normal work of breathing GI: soft, nontender,  nondistended, + BS MS: no deformity or atrophy  Skin: warm and dry, no rash Neuro:  Strength and sensation are intact Psych: euthymic mood, full affect    Recent Labs: 10/27/2016: ALT 35; BUN 14; Creat 0.99; Hemoglobin 13.7; Platelets 217; Potassium 5.1; Sodium 137    Lipid Panel Lab Results  Component Value Date   CHOL 129 10/27/2016   HDL 39 (L) 10/27/2016   LDLCALC 74 10/27/2016   TRIG 78 10/27/2016      Wt Readings from Last 3 Encounters:  11/23/16 183 lb 12 oz (83.3 kg)  10/27/16 182 lb (82.6 kg)  07/27/16 176 lb (79.8 kg)       ASSESSMENT AND PLAN:  Hypertensive heart disease without heart failure - Plan: EKG 12-Lead Blood pressure is well controlled on today's visit. No changes made to the medications.  Coronary artery disease involving native coronary artery of native heart without angina pectoris Currently with no symptoms of angina. No further workup at this time. Continue current medication regimen.  Mixed hyperlipidemia Long discussion concerning his lipid management. He does not want additional medication, prefers to stay on low-dose Lipitor  Type 2 diabetes mellitus without complication, without long-term current use  of insulin ([place]) Hemoglobin A1c trending downward.  Hemoglobin A1c 6.1  Smoker We have encouraged him to continue to work on weaning his cigarettes and smoking cessation. He will continue to work on this and does not want any assistance with chantix.   S/P coronary artery stent placement He prefers to go changes aspirin down to 81 mg daily   Total encounter time more than 15 minutes  Greater than 50% was spent in counseling and coordination of care with the patient   Disposition:   F/U  6 months   Orders Placed This Encounter  Procedures  . EKG 12-Lead     Signed, Esmond Plants, M.D., Ph.D. 11/23/2016  [place], [place]

## 2016-12-02 ENCOUNTER — Other Ambulatory Visit: Payer: Self-pay | Admitting: Cardiovascular Disease

## 2017-01-15 ENCOUNTER — Other Ambulatory Visit: Payer: Self-pay | Admitting: Family Medicine

## 2017-01-15 NOTE — Telephone Encounter (Signed)
Dec 2017 labs reviewed Rx approved

## 2017-03-27 ENCOUNTER — Other Ambulatory Visit: Payer: Self-pay | Admitting: Cardiovascular Disease

## 2017-04-27 ENCOUNTER — Encounter: Payer: Self-pay | Admitting: Family Medicine

## 2017-04-27 ENCOUNTER — Ambulatory Visit (INDEPENDENT_AMBULATORY_CARE_PROVIDER_SITE_OTHER): Payer: Commercial Managed Care - HMO | Admitting: Family Medicine

## 2017-04-27 VITALS — BP 124/76 | HR 78 | Temp 97.4°F | Resp 16 | Wt 174.1 lb

## 2017-04-27 DIAGNOSIS — I119 Hypertensive heart disease without heart failure: Secondary | ICD-10-CM

## 2017-04-27 DIAGNOSIS — K648 Other hemorrhoids: Secondary | ICD-10-CM

## 2017-04-27 DIAGNOSIS — I5022 Chronic systolic (congestive) heart failure: Secondary | ICD-10-CM

## 2017-04-27 DIAGNOSIS — Z5181 Encounter for therapeutic drug level monitoring: Secondary | ICD-10-CM | POA: Diagnosis not present

## 2017-04-27 DIAGNOSIS — E119 Type 2 diabetes mellitus without complications: Secondary | ICD-10-CM | POA: Diagnosis not present

## 2017-04-27 DIAGNOSIS — Z114 Encounter for screening for human immunodeficiency virus [HIV]: Secondary | ICD-10-CM | POA: Diagnosis not present

## 2017-04-27 DIAGNOSIS — I2510 Atherosclerotic heart disease of native coronary artery without angina pectoris: Secondary | ICD-10-CM

## 2017-04-27 DIAGNOSIS — Z1159 Encounter for screening for other viral diseases: Secondary | ICD-10-CM

## 2017-04-27 LAB — COMPLETE METABOLIC PANEL WITH GFR
ALT: 36 U/L (ref 9–46)
AST: 28 U/L (ref 10–35)
Albumin: 4.4 g/dL (ref 3.6–5.1)
Alkaline Phosphatase: 50 U/L (ref 40–115)
BUN: 16 mg/dL (ref 7–25)
CHLORIDE: 102 mmol/L (ref 98–110)
CO2: 23 mmol/L (ref 20–31)
Calcium: 9.4 mg/dL (ref 8.6–10.3)
Creat: 0.99 mg/dL (ref 0.70–1.25)
GFR, EST NON AFRICAN AMERICAN: 82 mL/min (ref >=60)
GFR, Est African American: >89 mL/min (ref >=60)
GLUCOSE: 149 mg/dL — AB (ref 65–99)
POTASSIUM: 5.2 mmol/L (ref 3.5–5.3)
SODIUM: 135 mmol/L (ref 135–146)
Total Bilirubin: 0.9 mg/dL (ref 0.2–1.2)
Total Protein: 7 g/dL (ref 6.1–8.1)

## 2017-04-27 LAB — LIPID PANEL
CHOLESTEROL: 129 mg/dL (ref <200)
HDL: 36 mg/dL — ABNORMAL LOW (ref >40)
LDL CALC: 74 mg/dL (ref <100)
Total CHOL/HDL Ratio: 3.6 Ratio (ref <5.0)
Triglycerides: 94 mg/dL (ref <150)
VLDL: 19 mg/dL (ref <30)

## 2017-04-27 LAB — HEPATITIS C ANTIBODY: HCV Ab: NEGATIVE

## 2017-04-27 NOTE — Assessment & Plan Note (Signed)
asymptomatic

## 2017-04-27 NOTE — Assessment & Plan Note (Signed)
Check labs 

## 2017-04-27 NOTE — Assessment & Plan Note (Signed)
Follow-up with cardiologist; continue aspirin

## 2017-04-27 NOTE — Assessment & Plan Note (Signed)
Little numbness right great toe, otherwise normal foot exam; eye exam coming up; check A1c

## 2017-04-27 NOTE — Progress Notes (Signed)
BP 124/76   Pulse 78   Temp 97.4 F (36.3 C) (Oral)   Resp 16   Wt 174 lb 1.6 oz (79 kg)   SpO2 98%   BMI 24.28 kg/m    Subjective:    Patient ID: Brian Dean, male    DOB: 1956/07/19, 61 y.o.   MRN: [id number]  HPI: Brian Dean is a 61 y.o. male  Chief Complaint  Patient presents with  . Follow-up    HPI Here for follow-up  CAD; sees cardiologist, Dr. Rockey Situ, no CP, no pressure; chronic CHF; no DOE, no PND  He had a problem with his right ear last week; called audiologist; no available appts; tried H2O2, big ball of wax; hearing better; no fevers; had been to audiologist in the past with same people  Hx of internal hemorrhoids; has a speciaist, Dr. Bary Castilla; not constipated  Type DM; no low sugars; no blurred vision, no dry mouth; appt in July with eye exam; a little numbness in right great toe  Reviewed last lipid panel; HDL a little low; LDL under 100, but not under 70  Depression screen [facility] 2/9 04/27/2017 10/27/2016 04/24/2016  Decreased Interest 0 0 0  Down, Depressed, Hopeless 0 0 0  PHQ - 2 Score 0 0 0   Relevant past medical, surgical, family and social history reviewed Past Medical History:  Diagnosis Date  . Arthritis   . CAD (coronary artery disease) Nov 2014   diffuse, aggresive risk factor modification recommended  . Diabetes mellitus without complication ([place])   . Diabetic neuropathy ([place])   . Dyslipidemia   . ED (erectile dysfunction)   . GERD (gastroesophageal reflux disease)   . History of acute pyelonephritis Aug. 2014   hospitalized; 100k E. coli pansensitive  . Hypercholesteremia   . Hyperlipidemia   . Hypertension   . Hypertensive heart disease   . Inguinal hernia   . Sciatica   . Syncope and collapse   . Tobacco use   . Unspecified systolic (congestive) heart failure [facility])    Past Surgical History:  Procedure Laterality Date  . CARDIAC CATHETERIZATION  11/14   [facility] x1  . [facility]  [facility]  . COLONOSCOPY WITH  PROPOFOL N/A 06/22/2016   Procedure: COLONOSCOPY WITH PROPOFOL;  Surgeon: Robert Bellow, MD;  Location: [facility] ENDOSCOPY;  Service: Endoscopy;  Laterality: N/A;  . ELBOW ARTHROSCOPY  2001   lt  . GUM SURGERY    . OLECRANON BURSECTOMY  11/12/2011   Procedure: OLECRANON BURSA;  Surgeon: Linna Hoff;  Location: [place];  Service: Orthopedics;  Laterality: Right;  olecracnon bursectomy with delayed closure  . WISDOM TOOTH EXTRACTION     Family History  Problem Relation Age of Onset  . Hypertension Father   . Diabetes Father   . Heart disease Father   . Diabetes Mother   . Heart disease Mother   . Hypertension Mother   . Cancer Mother        bladder  . Diabetes Sister   . Stroke Sister        mini stroke  . Diabetes Brother   . Diabetes Sister   . Diabetes Sister   . Diabetes Sister   . Diabetes Sister    Social History   Social History  . Marital status: Married    Spouse name: N/A  . Number of children: N/A  . Years of education: N/A   Occupational History  . Not  on file.   Social History Main Topics  . Smoking status: Current Every Day Smoker    Packs/day: 0.50    Years: 40.00    Types: Cigarettes    Last attempt to quit: 11/09/2012  . Smokeless tobacco: Never Used  . Alcohol use 0.0 oz/week     Comment: occasional  . Drug use: No  . Sexual activity: Not on file   Other Topics Concern  . Not on file   Social History Narrative  . No narrative on file   Interim medical history since last visit reviewed. Allergies and medications reviewed  Review of Systems Per HPI unless specifically indicated above     Objective:    BP 124/76   Pulse 78   Temp 97.4 F (36.3 C) (Oral)   Resp 16   Wt 174 lb 1.6 oz (79 kg)   SpO2 98%   BMI 24.28 kg/m   Wt Readings from Last 3 Encounters:  04/27/17 174 lb 1.6 oz (79 kg)  11/23/16 183 lb 12 oz (83.3 kg)  10/27/16 182 lb (82.6 kg)    Physical Exam  Constitutional: He appears well-developed and  well-nourished. No distress.  HENT:  Head: Normocephalic and atraumatic.  Eyes: EOM are normal. No scleral icterus.  Neck: No thyromegaly present.  Cardiovascular: Normal rate and regular rhythm.   Pulmonary/Chest: Effort normal and breath sounds normal.  Abdominal: Soft. Bowel sounds are normal. He exhibits no distension.  Musculoskeletal: He exhibits no edema.  Neurological: He is alert. He displays no tremor. Gait normal.  Skin: Skin is warm and dry. No pallor.  Psychiatric: He has a normal mood and affect. His behavior is normal. Judgment and thought content normal.   Diabetic Foot Form - Detailed   Diabetic Foot Exam - detailed Diabetic Foot exam was performed with the following findings:  Yes 04/27/2017  9:12 AM  Visual Foot Exam completed.:  Yes  Can the patient see the bottom of their feet?:  Yes Are the shoes appropriate in style and fit?:  No Pulse Foot Exam completed.:  Yes  Right Dorsalis Pedis:  Present Left Dorsalis Pedis:  Present  Sensory Foot Exam Completed.:  Yes Semmes-Weinstein Monofilament Test R Site 1-Great Toe:  Pos (Comment: diminished) L Site 1-Great Toe:  Pos        Results for orders placed or performed in visit on 10/27/16  Hemoglobin A1c  Result Value Ref Range   Hgb A1c MFr Bld 6.1 (H) <5.7 %   Mean Plasma Glucose 128 mg/dL  Lipid panel  Result Value Ref Range   Cholesterol 129 <200 mg/dL   Triglycerides 78 <150 mg/dL   HDL 39 (L) >40 mg/dL   Total CHOL/HDL Ratio 3.3 <5.0 Ratio   VLDL 16 <30 mg/dL   LDL Cholesterol 74 <100 mg/dL  CBC with Differential/Platelet  Result Value Ref Range   WBC 7.0 3.8 - 10.8 K/uL   RBC 4.48 4.20 - 5.80 MIL/uL   Hemoglobin 13.7 13.2 - 17.1 g/dL   HCT 41.6 38.5 - 50.0 %   MCV 92.9 80.0 - 100.0 fL   MCH 30.6 27.0 - 33.0 pg   MCHC 32.9 32.0 - 36.0 g/dL   RDW 12.9 11.0 - 15.0 %   Platelets 217 140 - 400 K/uL   MPV 9.1 7.5 - 12.5 fL   Neutro Abs 4,690 1,500 - 7,800 cells/uL   Lymphs Abs 1,610 850 - 3,900  cells/uL   Monocytes Absolute 630 200 - 950 cells/uL  Eosinophils Absolute 70 15 - 500 cells/uL   Basophils Absolute 0 0 - 200 cells/uL   Neutrophils Relative % 67 %   Lymphocytes Relative 23 %   Monocytes Relative 9 %   Eosinophils Relative 1 %   Basophils Relative 0 %   Smear Review Criteria for review not met   COMPLETE METABOLIC PANEL WITH GFR  Result Value Ref Range   Sodium 137 135 - 146 mmol/L   Potassium 5.1 3.5 - 5.3 mmol/L   Chloride 103 98 - 110 mmol/L   CO2 26 20 - 31 mmol/L   Glucose, Bld 126 (H) 65 - 99 mg/dL   BUN 14 7 - 25 mg/dL   Creat 0.99 0.70 - 1.25 mg/dL   Total Bilirubin 0.9 0.2 - 1.2 mg/dL   Alkaline Phosphatase 41 40 - 115 U/L   AST 29 10 - 35 U/L   ALT 35 9 - 46 U/L   Total Protein 7.3 6.1 - 8.1 g/dL   Albumin 4.4 3.6 - 5.1 g/dL   Calcium 9.2 8.6 - 10.3 mg/dL   GFR, Est African American >89 >=60 mL/min   GFR, Est Non African American 82 >=60 mL/min      Assessment & Plan:   Problem List Items Addressed This Visit      Cardiovascular and Mediastinum   Unspecified systolic (congestive) heart failure (HCC)    asymptomatic      Internal hemorrhoids    ecnoruaged water and fiber; see surgeon if needed      Hypertensive heart disease (Chronic)    Excellent control today; encouragement given to reduce salt and processed foods      CAD (coronary artery disease) (Chronic)    Follow-up with cardiologist; continue aspirin      Relevant Orders   Lipid panel     Endocrine   Diabetes mellitus without complication (HCC) - Primary    Little numbness right great toe, otherwise normal foot exam; eye exam coming up; check A1c      Relevant Orders   Microalbumin / creatinine urine ratio   Lipid panel   Hemoglobin A1c     Other   Medication monitoring encounter    Check labs      Relevant Orders   COMPLETE METABOLIC PANEL WITH GFR    Other Visit Diagnoses    Screening for HIV (human immunodeficiency virus)       Relevant Orders   HIV  antibody (with reflex)   Encounter for hepatitis C screening test for low risk patient       Relevant Orders   Hepatitis C Antibody       Follow up plan: Return in about 6 months (around 10/27/2017) for twenty minute follow-up with fasting labs.  An after-visit summary was printed and given to the patient at [place].  Please see the patient instructions which may contain other information and recommendations beyond what is mentioned above in the assessment and plan.  No orders of the defined types were placed in this encounter.   Orders Placed This Encounter  Procedures  . Hepatitis C Antibody  . HIV antibody (with reflex)  . Microalbumin / creatinine urine ratio  . Lipid panel  . Hemoglobin A1c  . COMPLETE METABOLIC PANEL WITH GFR

## 2017-04-27 NOTE — Assessment & Plan Note (Signed)
Excellent control today; encouragement given to reduce salt and processed foods

## 2017-04-27 NOTE — Patient Instructions (Signed)
Continue to watch your diet Please do see your eye doctor regularly, and have your eyes examined every year (or more often per his or her recommendation) Check your feet every night and let me know right away of any sores, infections, numbness, etc. Try to limit sweets, white bread, white rice, white potatoes It is okay with me for you to not check your fingerstick blood sugars (per [employer] of Endocrinology Best Practices), unless you are interested and feel it would be helpful for you

## 2017-04-27 NOTE — Assessment & Plan Note (Signed)
ecnoruaged water and fiber; see surgeon if needed

## 2017-04-28 LAB — MICROALBUMIN / CREATININE URINE RATIO
CREATININE, URINE: 110 mg/dL (ref 20–370)
Microalb Creat Ratio: 5 mcg/mg creat (ref <30)
Microalb, Ur: 0.6 mg/dL

## 2017-04-28 LAB — HEMOGLOBIN A1C
HEMOGLOBIN A1C: 6.2 % — AB (ref <5.7)
MEAN PLASMA GLUCOSE: 131 mg/dL

## 2017-04-28 LAB — HIV ANTIBODY (ROUTINE TESTING W REFLEX): HIV: NONREACTIVE

## 2017-05-13 DIAGNOSIS — E119 Type 2 diabetes mellitus without complications: Secondary | ICD-10-CM | POA: Diagnosis not present

## 2017-05-13 LAB — HM DIABETES EYE EXAM

## 2017-07-03 DIAGNOSIS — M5441 Lumbago with sciatica, right side: Secondary | ICD-10-CM | POA: Diagnosis not present

## 2017-07-03 DIAGNOSIS — M25551 Pain in right hip: Secondary | ICD-10-CM | POA: Diagnosis not present

## 2017-07-03 DIAGNOSIS — G8929 Other chronic pain: Secondary | ICD-10-CM | POA: Diagnosis not present

## 2017-07-25 ENCOUNTER — Other Ambulatory Visit: Payer: Self-pay | Admitting: Cardiovascular Disease

## 2017-09-22 DIAGNOSIS — D400 Neoplasm of uncertain behavior of prostate: Secondary | ICD-10-CM | POA: Diagnosis not present

## 2017-09-22 DIAGNOSIS — N401 Enlarged prostate with lower urinary tract symptoms: Secondary | ICD-10-CM | POA: Diagnosis not present

## 2017-10-16 ENCOUNTER — Other Ambulatory Visit: Payer: Self-pay | Admitting: Family Medicine

## 2017-10-16 NOTE — Telephone Encounter (Signed)
Reviewed last Cr June 2018; Rx approved

## 2017-10-27 ENCOUNTER — Encounter: Payer: Self-pay | Admitting: Family Medicine

## 2017-10-27 ENCOUNTER — Ambulatory Visit: Payer: Commercial Managed Care - HMO | Admitting: Family Medicine

## 2017-10-27 VITALS — BP 132/70 | HR 74 | Temp 98.0°F | Ht 71.0 in | Wt 174.5 lb

## 2017-10-27 DIAGNOSIS — E782 Mixed hyperlipidemia: Secondary | ICD-10-CM | POA: Diagnosis not present

## 2017-10-27 DIAGNOSIS — M5430 Sciatica, unspecified side: Secondary | ICD-10-CM | POA: Diagnosis not present

## 2017-10-27 DIAGNOSIS — Z5181 Encounter for therapeutic drug level monitoring: Secondary | ICD-10-CM

## 2017-10-27 DIAGNOSIS — E1142 Type 2 diabetes mellitus with diabetic polyneuropathy: Secondary | ICD-10-CM | POA: Diagnosis not present

## 2017-10-27 DIAGNOSIS — Z23 Encounter for immunization: Secondary | ICD-10-CM

## 2017-10-27 DIAGNOSIS — I2510 Atherosclerotic heart disease of native coronary artery without angina pectoris: Secondary | ICD-10-CM | POA: Diagnosis not present

## 2017-10-27 MED ORDER — ATORVASTATIN CALCIUM 10 MG PO TABS: 10.0000 mg | ORAL_TABLET | Freq: Every day | ORAL | 3 refills | Status: DC

## 2017-10-27 MED ORDER — LISINOPRIL 20 MG PO TABS: 20.0000 mg | ORAL_TABLET | Freq: Every day | ORAL | 3 refills | Status: DC

## 2017-10-27 MED ORDER — GABAPENTIN 300 MG PO CAPS: 300.0000 mg | ORAL_CAPSULE | Freq: Every day | ORAL | 3 refills | Status: DC

## 2017-10-27 NOTE — Assessment & Plan Note (Signed)
Foot exam done, keep close eye on these places; check A1c

## 2017-10-27 NOTE — Assessment & Plan Note (Signed)
Check fasting lipids, goal LDL is less than 70

## 2017-10-27 NOTE — Assessment & Plan Note (Signed)
Okay for refill of gabapentin; patient wishes to get that Rx here rather than go back to ortho

## 2017-10-27 NOTE — Assessment & Plan Note (Signed)
Monitor kidneys, liver

## 2017-10-27 NOTE — Progress Notes (Signed)
BP 132/70 (BP Location: Right Arm, Patient Position: Sitting, Cuff Size: Normal)   Pulse 74   Temp 98 F (36.7 C) (Oral)   Ht 5\' 11"  (1.803 m)   Wt 174 lb 8 oz (79.2 kg)   SpO2 99%   BMI 24.34 kg/m    Subjective:    Patient ID: Brian Dean, male    DOB: 1956/10/15, 60 y.o.   MRN: [id number]  HPI: Brian Dean is a 61 y.o. male  Chief Complaint  Patient presents with  . Follow-up  . Diabetes  . Immunizations    discuss flu vaccine    HPI Patient is here for follow-up  Type 2 diabetes mellitus; eye exam UTD; he hurt the big toes while refinishing the master bath; keeping a close eye on these places; not checking sugars; no dry mouth or blurred vision Lab Results  Component Value Date   HGBA1C 6.2 (H) 04/27/2017   He wants to discuss the flu vaccine He is also due for the pneumonia vaccine per the health maintenance tab; however, he had the PPSV-23 done at [facility] UTD and is UTD until age 12 per ACIP guidelines  High cholesterol; limiting fatty meats; low HDL likely inherited; not really doing "exercise"; goes up and down lots of steps every day; no chest pain with activity Lab Results  Component Value Date   CHOL 129 04/27/2017   HDL 36 (L) 04/27/2017   LDLCALC 74 04/27/2017   TRIG 94 04/27/2017   CHOLHDL 3.6 04/27/2017   CAD; goes next month to cardiologist; no CP  Saw urologist; PSA down to 0.6; no cancer per patient; goes once a year, Dr. Rogers Blocker  High blood pressure; controlled; not really checking away from [place]; occasionally at [facility]; trying to avoid extra salt  [facility] was refilling gabapentin, taking it at night for sciatica and neuropathy  Depression screen [facility] 2/9 10/27/2017 04/27/2017 10/27/2016 04/24/2016  Decreased Interest 0 0 0 0  Down, Depressed, Hopeless 0 0 0 0  PHQ - 2 Score 0 0 0 0    Relevant past medical, surgical, family and social history reviewed Past Medical History:  Diagnosis Date  . Arthritis   . CAD (coronary  artery disease) Nov 2014   diffuse, aggresive risk factor modification recommended  . Diabetes mellitus without complication ([place])   . Diabetic neuropathy ([place])   . Dyslipidemia   . ED (erectile dysfunction)   . GERD (gastroesophageal reflux disease)   . History of acute pyelonephritis Aug. 2014   hospitalized; 100k E. coli pansensitive  . Hypercholesteremia   . Hyperlipidemia   . Hypertension   . Hypertensive heart disease   . Inguinal hernia   . Sciatica   . Syncope and collapse   . Tobacco use   . Unspecified systolic (congestive) heart failure [facility])    Past Surgical History:  Procedure Laterality Date  . CARDIAC CATHETERIZATION  11/14   [facility] x1  . [facility]  [facility]  . COLONOSCOPY WITH PROPOFOL N/A 06/22/2016   Procedure: COLONOSCOPY WITH PROPOFOL;  Surgeon: Robert Bellow, MD;  Location: [facility] ENDOSCOPY;  Service: Endoscopy;  Laterality: N/A;  . ELBOW ARTHROSCOPY  2001   lt  . GUM SURGERY    . OLECRANON BURSECTOMY  11/12/2011   Procedure: OLECRANON BURSA;  Surgeon: Linna Hoff;  Location: [place];  Service: Orthopedics;  Laterality: Right;  olecracnon bursectomy with delayed closure  . WISDOM TOOTH EXTRACTION  Family History  Problem Relation Age of Onset  . Hypertension Father   . Diabetes Father   . Heart disease Father   . Diabetes Mother   . Heart disease Mother   . Hypertension Mother   . Cancer Mother        bladder  . Diabetes Sister   . Stroke Sister        mini stroke  . Diabetes Brother   . Diabetes Sister   . Diabetes Sister   . Diabetes Sister   . Diabetes Sister    Social History   Tobacco Use  . Smoking status: Current Every Day Smoker    Packs/day: 0.50    Years: 40.00    Pack years: 20.00    Types: Cigarettes    Last attempt to quit: 11/09/2012    Years since quitting: 4.9  . Smokeless tobacco: Never Used  Substance Use Topics  . Alcohol use: Yes    Alcohol/week: 0.0 oz    Comment: occasional  . Drug  use: No    Interim medical history since last visit reviewed. Allergies and medications reviewed  Review of Systems  Constitutional: Negative for unexpected weight change.  Cardiovascular: Negative for chest pain.   Per HPI unless specifically indicated above     Objective:    BP 132/70 (BP Location: Right Arm, Patient Position: Sitting, Cuff Size: Normal)   Pulse 74   Temp 98 F (36.7 C) (Oral)   Ht 5\' 11"  (1.803 m)   Wt 174 lb 8 oz (79.2 kg)   SpO2 99%   BMI 24.34 kg/m   Wt Readings from Last 3 Encounters:  10/27/17 174 lb 8 oz (79.2 kg)  04/27/17 174 lb 1.6 oz (79 kg)  11/23/16 183 lb 12 oz (83.3 kg)    Physical Exam  Constitutional: He appears well-developed and well-nourished. No distress.  HENT:  Head: Normocephalic and atraumatic.  Eyes: EOM are normal. No scleral icterus.  Neck: No thyromegaly present.  Cardiovascular: Normal rate and regular rhythm.  Pulmonary/Chest: Effort normal and breath sounds normal.  Abdominal: Soft. Bowel sounds are normal. He exhibits no distension.  Musculoskeletal: He exhibits no edema.  Neurological: He is alert. He displays no tremor. Gait normal.  Skin: Skin is warm and dry. No pallor.  Psychiatric: He has a normal mood and affect. His behavior is normal. Judgment and thought content normal.   Diabetic Foot Form - Detailed   Diabetic Foot Exam - detailed Diabetic Foot exam was performed with the following findings:  Yes 10/27/2017  9:17 AM  Visual Foot Exam completed.:  Yes  Pulse Foot Exam completed.:  Yes  Right Dorsalis Pedis:  Present Left Dorsalis Pedis:  Present  Sensory Foot Exam Completed.:  Yes Semmes-Weinstein Monofilament Test R Site 1-Great Toe:  Pos L Site 1-Great Toe:  Pos    Comments:  Cracked scabbed skin medial great toes bilaterally; no erythema, no purulence     Results for orders placed or performed in visit on 05/14/17  HM DIABETES EYE EXAM  Result Value Ref Range   HM Diabetic Eye Exam No  Retinopathy No Retinopathy      Assessment & Plan:   Problem List Items Addressed This Visit      Cardiovascular and Mediastinum   CAD (coronary artery disease) (Chronic)    No chest pain; keep f/u with cardiologist; continue aspirin, statin      Relevant Medications   atorvastatin (LIPITOR) 10 MG tablet   lisinopril (PRINIVIL,ZESTRIL)  20 MG tablet     Endocrine   Diabetic neuropathy (HCC) - Primary (Chronic)    Foot exam done, keep close eye on these places; check A1c      Relevant Medications   atorvastatin (LIPITOR) 10 MG tablet   lisinopril (PRINIVIL,ZESTRIL) 20 MG tablet   Other Relevant Orders   Lipid panel   Hemoglobin A1c   COMPLETE METABOLIC PANEL WITH GFR     Nervous and Auditory   Sciatica    Okay for refill of gabapentin; patient wishes to get that Rx here rather than go back to ortho      Relevant Medications   gabapentin (NEURONTIN) 300 MG capsule     Other   Medication monitoring encounter    Monitor kidneys, liver      Relevant Orders   COMPLETE METABOLIC PANEL WITH GFR   Hyperlipidemia    Check fasting lipids, goal LDL is less than 70      Relevant Medications   atorvastatin (LIPITOR) 10 MG tablet   lisinopril (PRINIVIL,ZESTRIL) 20 MG tablet   Other Relevant Orders   Lipid panel    Other Visit Diagnoses    Flu vaccine need       Relevant Orders   Flu Vaccine QUAD 36+ mos IM (Completed)       Follow up plan: Return in about 6 months (around 04/27/2018) for twenty minute follow-up with fasting labs.  An after-visit summary was printed and given to the patient at [facility].  Please see the patient instructions which may contain other information and recommendations beyond what is mentioned above in the assessment and plan.  Meds ordered this encounter  Medications  . atorvastatin (LIPITOR) 10 MG tablet    Sig: Take 1 tablet (10 mg total) by mouth at bedtime.    Dispense:  90 tablet    Refill:  3  . gabapentin (NEURONTIN) 300 MG  capsule    Sig: Take 1 capsule (300 mg total) by mouth at bedtime.    Dispense:  90 capsule    Refill:  3  . lisinopril (PRINIVIL,ZESTRIL) 20 MG tablet    Sig: Take 1 tablet (20 mg total) by mouth daily.    Dispense:  90 tablet    Refill:  3    Orders Placed This Encounter  Procedures  . Flu Vaccine QUAD 36+ mos IM  . Lipid panel  . Hemoglobin A1c  . COMPLETE METABOLIC PANEL WITH GFR

## 2017-10-27 NOTE — Assessment & Plan Note (Signed)
No chest pain; keep f/u with cardiologist; continue aspirin, statin

## 2017-10-27 NOTE — Patient Instructions (Signed)
Try to follow the DASH guidelines (DASH stands for Dietary Approaches to Stop Hypertension). Try to limit the sodium in your diet to no more than 1,500mg of sodium per day. Certainly try to not exceed 2,000 mg per day at the very most. Do not add salt when cooking or at the table.  Check the sodium amount on labels when shopping, and choose items lower in sodium when given a choice. Avoid or limit foods that already contain a lot of sodium. Eat a diet rich in fruits and vegetables and whole grains, and try to lose weight if overweight or obese Try to limit saturated fats in your diet (bologna, hot dogs, barbeque, cheeseburgers, hamburgers, steak, bacon, sausage, cheese, etc.) and get more fresh fruits, vegetables, and whole grains  

## 2017-10-28 ENCOUNTER — Other Ambulatory Visit: Payer: Self-pay | Admitting: Family Medicine

## 2017-10-28 DIAGNOSIS — E875 Hyperkalemia: Secondary | ICD-10-CM

## 2017-10-28 LAB — COMPLETE METABOLIC PANEL WITH GFR
AG RATIO: 1.7 (calc) (ref 1.0–2.5)
ALBUMIN MSPROF: 4.2 g/dL (ref 3.6–5.1)
ALT: 24 U/L (ref 9–46)
AST: 24 U/L (ref 10–35)
Alkaline phosphatase (APISO): 52 U/L (ref 40–115)
BUN: 14 mg/dL (ref 7–25)
CALCIUM: 9.6 mg/dL (ref 8.6–10.3)
CO2: 29 mmol/L (ref 20–32)
CREATININE: 0.99 mg/dL (ref 0.70–1.25)
Chloride: 102 mmol/L (ref 98–110)
GFR, EST AFRICAN AMERICAN: 95 mL/min/{1.73_m2} (ref >=60)
GFR, EST NON AFRICAN AMERICAN: 82 mL/min/{1.73_m2} (ref >=60)
GLOBULIN: 2.5 g/dL (ref 1.9–3.7)
Glucose, Bld: 129 mg/dL — ABNORMAL HIGH (ref 65–99)
POTASSIUM: 5.4 mmol/L — AB (ref 3.5–5.3)
SODIUM: 137 mmol/L (ref 135–146)
TOTAL PROTEIN: 6.7 g/dL (ref 6.1–8.1)
Total Bilirubin: 0.8 mg/dL (ref 0.2–1.2)

## 2017-10-28 LAB — LIPID PANEL
CHOL/HDL RATIO: 2.8 (calc) (ref <5.0)
CHOLESTEROL: 123 mg/dL (ref <200)
HDL: 44 mg/dL (ref >40)
LDL Cholesterol (Calc): 65 mg/dL (calc)
Non-HDL Cholesterol (Calc): 79 mg/dL (calc) (ref <130)
Triglycerides: 56 mg/dL (ref <150)

## 2017-10-28 LAB — HEMOGLOBIN A1C
EAG (MMOL/L): 6.8 (calc)
HEMOGLOBIN A1C: 5.9 %{Hb} — AB (ref <5.7)
MEAN PLASMA GLUCOSE: 123 (calc)

## 2017-10-28 NOTE — Assessment & Plan Note (Signed)
Maybe secondary to intake versus ACE-I; recheck K+ in one month

## 2017-10-28 NOTE — Progress Notes (Signed)
Check BMP in one month

## 2017-11-11 DIAGNOSIS — H353131 Nonexudative age-related macular degeneration, bilateral, early dry stage: Secondary | ICD-10-CM | POA: Diagnosis not present

## 2017-11-14 ENCOUNTER — Other Ambulatory Visit: Payer: Self-pay | Admitting: Cardiovascular Disease

## 2017-12-11 NOTE — Progress Notes (Signed)
Cardiology Office Note  Date:  12/13/2017   ID:  Brian Dean, DOB May 15, 1956, MRN [id number]  PCP:  Arnetha Courser, MD   Chief Complaint  Patient presents with  . OTHER    12 month f/u no complaints today. Meds reviewed verbally with pt.    HPI:  Brian Dean is a 62 year old gentleman with a history of  diabetes,  hypertension,  long smoking history ,   previous episodes of chest pain ,  CT scan of his abdomen showing large gallstone,  prostate issues  Positive stress test,   cardiac catheterization  showed severe proximal left circumflex disease, 50% lesions in his LAD, 40% RCA disease. Stent placed to his proximal left circumflex 09/25/2013  who presents for routine followup of his coronary artery disease  In follow-up today reports he feels well Retired January 2018, active since that time Remodeling his house Occasional vapor use, rare cigarettes, marijuana sometimes Had some marijuana this morning, Wife concerned heart rate running low  Denies any anginal symptoms with heavy activity No shortness of breath, neck pain, shoulder pain Not requiring any nitro  Previously stopped Plavix secondary to excessive bruising Even on aspirin has cutting on his arms No regular exercise program  Lab work reviewed with him HBA1C 5.9 Total chol 123  EKG personally reviewed by myself on todays visit Shows sinus bradycardia rate 49 bpm T wave abnormality lead III aVF Similar to previous EKG 2017  Other past medical history Previous Stress Myoview showed what appears to be a proximally occluded RCA with peri-infarct ischemia.   Left anterior descending and left circumflex territory appear intact with no perfusion abnormality. Ejection fraction 47% with wall motion abnormality in the inferior wall.  Cardiac catheterization performed as above  Notes indicate admission from August 30 to 07/09/2013. He was admitted for acute pyelonephritis, acute on chronic low back pain,  hyponatremia, diabetes, hypertension  CT scan 07/07/2013 shows large calcified stone in the gallbladder neck, enlarged prostate He denies having any symptoms from his gallbladder stones Recent lab work showing total cholesterol 144, LDL 80, HDL 59    PMH:   has a past medical history of Arthritis, CAD (coronary artery disease) (Nov 2014), Diabetes mellitus without complication ([place]), Diabetic neuropathy ([place]), Dyslipidemia, ED (erectile dysfunction), GERD (gastroesophageal reflux disease), History of acute pyelonephritis (Aug. 2014), Hypercholesteremia, Hyperlipidemia, Hypertension, Hypertensive heart disease, Inguinal hernia, Sciatica, Syncope and collapse, Tobacco use, and Unspecified systolic (congestive) heart failure ([place]).  PSH:    Past Surgical History:  Procedure Laterality Date  . CARDIAC CATHETERIZATION  11/14   [facility] x1  . [facility]  [facility]  . COLONOSCOPY WITH PROPOFOL N/A 06/22/2016   Procedure: COLONOSCOPY WITH PROPOFOL;  Surgeon: Robert Bellow, MD;  Location: [facility] ENDOSCOPY;  Service: Endoscopy;  Laterality: N/A;  . ELBOW ARTHROSCOPY  2001   lt  . GUM SURGERY    . OLECRANON BURSECTOMY  11/12/2011   Procedure: OLECRANON BURSA;  Surgeon: Linna Hoff;  Location: [place];  Service: Orthopedics;  Laterality: Right;  olecracnon bursectomy with delayed closure  . WISDOM TOOTH EXTRACTION      Current Outpatient Medications  Medication Sig Dispense Refill  . aspirin 81 MG tablet Take 162 mg by mouth daily. Reported on 05/06/2016    . atorvastatin (LIPITOR) 10 MG tablet Take 1 tablet (10 mg total) by mouth at bedtime. 90 tablet 3  . CINNAMON PO Take 1,000 mg by mouth 2 (two) times daily. Reported on  05/06/2016    . finasteride (PROSCAR) 5 MG tablet Take 1 tablet (5 mg total) by mouth daily. 90 tablet 3  . gabapentin (NEURONTIN) 300 MG capsule Take 1 capsule (300 mg total) by mouth at bedtime. 90 capsule 3  . lisinopril (PRINIVIL,ZESTRIL) 20 MG tablet  Take 1 tablet (20 mg total) by mouth daily. 90 tablet 3  . metFORMIN (GLUCOPHAGE) 500 MG tablet TAKE 1 TABLET BY MOUTH 3 TIMES DAILY. TAKE 15-30 MINUTES PRIOR TO MEALS 270 tablet 1  . nitroGLYCERIN (NITROSTAT) 0.4 MG SL tablet Place 1 tablet (0.4 mg total) under the tongue every 5 (five) minutes as needed. 25 tablet 3  . pantoprazole (PROTONIX) 40 MG tablet TAKE 1 TABLET BY MOUTH EVERY DAY 30 tablet 3  . pyridOXINE (VITAMIN B-6) 100 MG tablet Take 100 mg by mouth daily. Reported on 05/06/2016    . tadalafil (CIALIS) 5 MG tablet Take 5 mg by mouth daily. Reported on 05/06/2016     No current facility-administered medications for this visit.      Allergies:   Patient has no known allergies.   Social History:  The patient  reports that he has been smoking cigarettes.  He has a 20.00 pack-year smoking history. he has never used smokeless tobacco. He reports that he drinks alcohol. He reports that he does not use drugs.   Family History:   family history includes Cancer in his mother; Diabetes in his brother, father, mother, sister, sister, sister, sister, and sister; Heart disease in his father and mother; Hypertension in his father and mother; Stroke in his sister.    Review of Systems: Review of Systems  Constitutional: Negative.   Respiratory: Negative.   Cardiovascular: Negative.   Gastrointestinal: Negative.   Musculoskeletal: Negative.   Neurological: Negative.   Psychiatric/Behavioral: Negative.   All other systems reviewed and are negative.    PHYSICAL EXAM: VS:  BP 136/64 (BP Location: Left Arm, Patient Position: Sitting, Cuff Size: Normal)   Pulse (!) 45   Ht 6' (1.829 m)   Wt 170 lb 8 oz (77.3 kg)   BMI 23.12 kg/m  , BMI Body mass index is 23.12 kg/m. Constitutional:  oriented to person, place, and time. No distress.  HENT:  Head: Normocephalic and atraumatic.  Eyes:  no discharge. No scleral icterus.  Neck: Normal range of motion. Neck supple. No JVD present.   Cardiovascular: Normal rate, regular rhythm, normal heart sounds and intact distal pulses. Exam reveals no gallop and no friction rub. No edema No murmur heard. Pulmonary/Chest: Effort normal and breath sounds normal. No stridor. No respiratory distress.  no wheezes.  no rales.  no tenderness.  Abdominal: Soft.  no distension.  no tenderness.  Musculoskeletal: Normal range of motion.  no  tenderness or deformity.  Neurological:  normal muscle tone. Coordination normal. No atrophy Skin: Skin is warm and dry. No rash noted. not diaphoretic.  Psychiatric:  normal mood and affect. behavior is normal. Thought content normal.      Recent Labs: 10/27/2017: ALT 24; BUN 14; Creat 0.99; Potassium 5.4; Sodium 137    Lipid Panel Lab Results  Component Value Date   CHOL 123 10/27/2017   HDL 44 10/27/2017   LDLCALC 74 04/27/2017   TRIG 56 10/27/2017      Wt Readings from Last 3 Encounters:  12/13/17 170 lb 8 oz (77.3 kg)  10/27/17 174 lb 8 oz (79.2 kg)  04/27/17 174 lb 1.6 oz (79 kg)  ASSESSMENT AND PLAN:  Hypertensive heart disease without heart failure - Plan: EKG 12-Lead Blood pressure is well controlled on today's visit. No changes made to the medications. stable  Coronary artery disease involving native coronary artery of native heart without angina pectoris Currently with no symptoms of angina. No further workup at this time. Continue current medication regimen.  Active, stable  Mixed hyperlipidemia Cholesterol is at goal on the current lipid regimen. No changes to the medications were made.  Weight down 4 pounds   Type 2 diabetes mellitus without complication, without long-term current use of insulin (HCC) With weight loss hemoglobin A1c down to 5.9 Stable  Smoker Counseled 5 minutes, does not want Chantix Using rare cigarettes  S/P coronary artery stent placement Tolerating low-dose aspirin Cuts on his arms   bradycardia Typically runs in the  50s Asymptomatic, no further workup at this time Perhaps because he used marijuana this morning and feels relaxed    Total encounter time more than 25 minutes  Greater than 50% was spent in counseling and coordination of care with the patient   Disposition:   F/U  12 months   Orders Placed This Encounter  Procedures  . EKG 12-Lead     Signed, Esmond Plants, M.D., Ph.D. 12/13/2017  [place], [place]

## 2017-12-13 ENCOUNTER — Ambulatory Visit: Payer: 59 | Admitting: Cardiovascular Disease

## 2017-12-13 ENCOUNTER — Encounter: Payer: Self-pay | Admitting: Cardiovascular Disease

## 2017-12-13 VITALS — BP 136/64 | HR 45 | Ht 72.0 in | Wt 170.5 lb

## 2017-12-13 DIAGNOSIS — F17200 Nicotine dependence, unspecified, uncomplicated: Secondary | ICD-10-CM

## 2017-12-13 DIAGNOSIS — E119 Type 2 diabetes mellitus without complications: Secondary | ICD-10-CM | POA: Diagnosis not present

## 2017-12-13 DIAGNOSIS — I119 Hypertensive heart disease without heart failure: Secondary | ICD-10-CM

## 2017-12-13 DIAGNOSIS — E782 Mixed hyperlipidemia: Secondary | ICD-10-CM

## 2017-12-13 DIAGNOSIS — Z955 Presence of coronary angioplasty implant and graft: Secondary | ICD-10-CM | POA: Diagnosis not present

## 2017-12-13 DIAGNOSIS — I25118 Atherosclerotic heart disease of native coronary artery with other forms of angina pectoris: Secondary | ICD-10-CM | POA: Diagnosis not present

## 2017-12-13 NOTE — Patient Instructions (Signed)

## 2018-03-11 ENCOUNTER — Other Ambulatory Visit: Payer: Self-pay | Admitting: Cardiovascular Disease

## 2018-04-13 ENCOUNTER — Other Ambulatory Visit: Payer: Self-pay | Admitting: Family Medicine

## 2018-04-27 ENCOUNTER — Ambulatory Visit: Payer: Commercial Managed Care - HMO | Admitting: Family Medicine

## 2018-04-27 ENCOUNTER — Encounter: Payer: Self-pay | Admitting: Family Medicine

## 2018-04-27 VITALS — BP 140/60 | HR 64 | Temp 98.6°F | Resp 14 | Ht 72.0 in | Wt 166.2 lb

## 2018-04-27 DIAGNOSIS — E1142 Type 2 diabetes mellitus with diabetic polyneuropathy: Secondary | ICD-10-CM | POA: Diagnosis not present

## 2018-04-27 DIAGNOSIS — I119 Hypertensive heart disease without heart failure: Secondary | ICD-10-CM

## 2018-04-27 DIAGNOSIS — Z5181 Encounter for therapeutic drug level monitoring: Secondary | ICD-10-CM

## 2018-04-27 DIAGNOSIS — I2510 Atherosclerotic heart disease of native coronary artery without angina pectoris: Secondary | ICD-10-CM | POA: Diagnosis not present

## 2018-04-27 DIAGNOSIS — E782 Mixed hyperlipidemia: Secondary | ICD-10-CM

## 2018-04-27 DIAGNOSIS — F17200 Nicotine dependence, unspecified, uncomplicated: Secondary | ICD-10-CM | POA: Diagnosis not present

## 2018-04-27 NOTE — Patient Instructions (Signed)
I do encourage you to quit smoking Call [phone] to sign up for smoking cessation classes You can call 1-800-QUIT-NOW to talk with a smoking cessation coach  

## 2018-04-27 NOTE — Assessment & Plan Note (Signed)
Continue metformin; foot exam by MD; check A1c

## 2018-04-27 NOTE — Assessment & Plan Note (Signed)
DASH guidelines; smoking cessation encouraged

## 2018-04-27 NOTE — Assessment & Plan Note (Signed)
Check lipids; goal LDL less than 70; limit saturated fats

## 2018-04-27 NOTE — Progress Notes (Signed)
BP 140/60   Pulse 64   Temp 98.6 F (37 C) (Oral)   Resp 14   Ht 6' (1.829 m)   Wt 166 lb 3.2 oz (75.4 kg)   SpO2 93%   BMI 22.54 kg/m    Subjective:    Patient ID: Brian Dean, male    DOB: Apr 04, 1956, 62 y.o.   MRN: [id number]  HPI: Brian Dean is a 62 y.o. male  Chief Complaint  Patient presents with  . Follow-up    HPI Patient is here for follow-up  He has type 2 diabetes mellitus; taking metformin; not checking sugars with my blessing; no dry mouth, no blurred vision; eye exam UTD, due in July and he'll schedule appointments Lab Results  Component Value Date   HGBA1C 5.9 (H) 10/27/2017   Hx of heart attack, coronary artery disease; taking statin, aspirin; no chest pain  High cholesterol; HDL has come up to 44; he has cut down ham and bologna sandwiches  Hypertension; taking ACE-I; ate salty food yesterday; two fresh tomatoes and had BLTs with potato chips  Tobacco abuse; not interested in quitting right now  Hyperkalemia; not eating a ton of fruit; not taking K+ supplements  He sees Dr. Rogers Blocker (urologist) for prostate health  Functional Status Survey: Is the patient deaf or have difficulty hearing?: No Does the patient have difficulty seeing, even when wearing glasses/contacts?: No Does the patient have difficulty concentrating, remembering, or making decisions?: No Does the patient have difficulty walking or climbing stairs?: No Does the patient have difficulty dressing or bathing?: No Does the patient have difficulty doing errands alone such as visiting a doctor's office or shopping?: No  Fall Risk  04/27/2018 10/27/2017 04/27/2017 10/27/2016 04/24/2016  Falls in the past year? No No No No No    Depression screen [facility] 2/9 04/27/2018 10/27/2017 04/27/2017 10/27/2016 04/24/2016  Decreased Interest 0 0 0 0 0  Down, Depressed, Hopeless 0 0 0 0 0  PHQ - 2 Score 0 0 0 0 0    Relevant past medical, surgical, family and social history reviewed Past  Medical History:  Diagnosis Date  . Arthritis   . CAD (coronary artery disease) Nov 2014   diffuse, aggresive risk factor modification recommended  . Diabetes mellitus without complication ([place])   . Diabetic neuropathy ([place])   . Dyslipidemia   . ED (erectile dysfunction)   . GERD (gastroesophageal reflux disease)   . History of acute pyelonephritis Aug. 2014   hospitalized; 100k E. coli pansensitive  . Hypercholesteremia   . Hyperlipidemia   . Hypertension   . Hypertensive heart disease   . Inguinal hernia   . Sciatica   . Syncope and collapse   . Tobacco use   . Unspecified systolic (congestive) heart failure [facility])    Past Surgical History:  Procedure Laterality Date  . CARDIAC CATHETERIZATION  11/14   [facility] x1  . [facility]  [facility]  . COLONOSCOPY WITH PROPOFOL N/A 06/22/2016   Procedure: COLONOSCOPY WITH PROPOFOL;  Surgeon: Robert Bellow, MD;  Location: [facility] ENDOSCOPY;  Service: Endoscopy;  Laterality: N/A;  . ELBOW ARTHROSCOPY  2001   lt  . GUM SURGERY    . OLECRANON BURSECTOMY  11/12/2011   Procedure: OLECRANON BURSA;  Surgeon: Linna Hoff;  Location: [place];  Service: Orthopedics;  Laterality: Right;  olecracnon bursectomy with delayed closure  . WISDOM TOOTH EXTRACTION     Family History  Problem Relation Age  of Onset  . Hypertension Father   . Diabetes Father   . Heart disease Father   . Diabetes Mother   . Heart disease Mother   . Hypertension Mother   . Cancer Mother        bladder  . Diabetes Sister   . Stroke Sister        mini stroke  . Diabetes Brother   . Diabetes Sister   . Diabetes Sister   . Diabetes Sister   . Diabetes Sister    Social History   Tobacco Use  . Smoking status: Current Every Day Smoker    Packs/day: 0.50    Years: 40.00    Pack years: 20.00    Types: Cigarettes    Last attempt to quit: 11/09/2012    Years since quitting: 5.4  . Smokeless tobacco: Never Used  Substance Use Topics  . Alcohol  use: Yes    Alcohol/week: 0.0 oz    Comment: occasional  . Drug use: No  MD note, drug use: yes, smokes marijuana  Interim medical history since last visit reviewed. Allergies and medications reviewed  Review of Systems Per HPI unless specifically indicated above     Objective:    BP 140/60   Pulse 64   Temp 98.6 F (37 C) (Oral)   Resp 14   Ht 6' (1.829 m)   Wt 166 lb 3.2 oz (75.4 kg)   SpO2 93%   BMI 22.54 kg/m   Wt Readings from Last 3 Encounters:  04/27/18 166 lb 3.2 oz (75.4 kg)  12/13/17 170 lb 8 oz (77.3 kg)  10/27/17 174 lb 8 oz (79.2 kg)    Physical Exam  Constitutional: He appears well-developed and well-nourished. No distress.  HENT:  Head: Normocephalic and atraumatic.  Eyes: EOM are normal. No scleral icterus.  Neck: No thyromegaly present.  Cardiovascular: Normal rate and regular rhythm.  Pulmonary/Chest: Effort normal and breath sounds normal.  Abdominal: Soft. Bowel sounds are normal. He exhibits no distension.  Musculoskeletal: He exhibits no edema.  Neurological: Coordination normal.  Skin: Skin is warm and dry. No pallor.  Psychiatric: He has a normal mood and affect. His behavior is normal. Judgment and thought content normal.    Results for orders placed or performed in visit on 10/27/17  Lipid panel  Result Value Ref Range   Cholesterol 123 <200 mg/dL   HDL 44 >40 mg/dL   Triglycerides 56 <150 mg/dL   LDL Cholesterol (Calc) 65 mg/dL (calc)   Total CHOL/HDL Ratio 2.8 <5.0 (calc)   Non-HDL Cholesterol (Calc) 79 <130 mg/dL (calc)  Hemoglobin A1c  Result Value Ref Range   Hgb A1c MFr Bld 5.9 (H) <5.7 % of total Hgb   Mean Plasma Glucose 123 (calc)   eAG (mmol/L) 6.8 (calc)  COMPLETE METABOLIC PANEL WITH GFR  Result Value Ref Range   Glucose, Bld 129 (H) 65 - 99 mg/dL   BUN 14 7 - 25 mg/dL   Creat 0.99 0.70 - 1.25 mg/dL   GFR, Est Non African American 82 > OR = 60 mL/min/1.68m2   GFR, Est African American 95 > OR = 60 mL/min/1.78m2    BUN/Creatinine Ratio NOT APPLICABLE 6 - 22 (calc)   Sodium 137 135 - 146 mmol/L   Potassium 5.4 (H) 3.5 - 5.3 mmol/L   Chloride 102 98 - 110 mmol/L   CO2 29 20 - 32 mmol/L   Calcium 9.6 8.6 - 10.3 mg/dL   Total Protein 6.7 6.1 -  8.1 g/dL   Albumin 4.2 3.6 - 5.1 g/dL   Globulin 2.5 1.9 - 3.7 g/dL (calc)   AG Ratio 1.7 1.0 - 2.5 (calc)   Total Bilirubin 0.8 0.2 - 1.2 mg/dL   Alkaline phosphatase (APISO) 52 40 - 115 U/L   AST 24 10 - 35 U/L   ALT 24 9 - 46 U/L      Assessment & Plan:   Problem List Items Addressed This Visit      Cardiovascular and Mediastinum   Hypertensive heart disease (Chronic)    DASH guidelines; smoking cessation encouraged      CAD (coronary artery disease) (Chronic)    Continue aspirin, statin, f/u with cardiologist        Endocrine   Diabetic neuropathy ([place]) - Primary (Chronic)    Continue metformin; foot exam by MD; check A1c      Relevant Orders   Hemoglobin A1C   Urine Microalbumin w/creat. ratio     Other   Smoker    Encouraged smoking cessation      Medication monitoring encounter    Check labs      Relevant Orders   COMPLETE METABOLIC PANEL WITH GFR   Hyperlipidemia    Check lipids; goal LDL less than 70; limit saturated fats      Relevant Orders   Lipid panel       Follow up plan: Return in about 6 months (around 10/27/2018) for follow-up visit with Dr. Sanda Klein.  An after-visit summary was printed and given to the patient at [place].  Please see the patient instructions which may contain other information and recommendations beyond what is mentioned above in the assessment and plan.  No orders of the defined types were placed in this encounter.   Orders Placed This Encounter  Procedures  . Hemoglobin A1C  . Urine Microalbumin w/creat. ratio  . COMPLETE METABOLIC PANEL WITH GFR  . Lipid panel

## 2018-04-27 NOTE — Assessment & Plan Note (Signed)
Encouraged smoking cessation 

## 2018-04-27 NOTE — Assessment & Plan Note (Signed)
Continue aspirin, statin, f/u with cardiologist

## 2018-04-27 NOTE — Assessment & Plan Note (Signed)
Check labs 

## 2018-04-28 ENCOUNTER — Other Ambulatory Visit: Payer: Self-pay | Admitting: Family Medicine

## 2018-04-28 MED ORDER — METFORMIN HCL 500 MG PO TABS: 500.0000 mg | ORAL_TABLET | Freq: Two times a day (BID) | ORAL | 1 refills | Status: DC

## 2018-04-28 NOTE — Progress Notes (Signed)
Decrease metformin frquency

## 2018-05-23 DIAGNOSIS — H353131 Nonexudative age-related macular degeneration, bilateral, early dry stage: Secondary | ICD-10-CM | POA: Diagnosis not present

## 2018-05-23 LAB — HM DIABETES EYE EXAM

## 2018-06-15 ENCOUNTER — Other Ambulatory Visit: Payer: Self-pay | Admitting: Cardiovascular Disease

## 2018-08-09 LAB — COMPLETE METABOLIC PANEL WITH GFR
AG Ratio: 1.6 (calc) (ref 1.0–2.5)
ALBUMIN MSPROF: 4.1 g/dL (ref 3.6–5.1)
ALT: 22 U/L (ref 9–46)
AST: 23 U/L (ref 10–35)
Alkaline phosphatase (APISO): 48 U/L (ref 40–115)
BILIRUBIN TOTAL: 0.6 mg/dL (ref 0.2–1.2)
BUN: 13 mg/dL (ref 7–25)
CHLORIDE: 104 mmol/L (ref 98–110)
CO2: 27 mmol/L (ref 20–32)
CREATININE: 0.96 mg/dL (ref 0.70–1.25)
Calcium: 9.1 mg/dL (ref 8.6–10.3)
GFR, EST AFRICAN AMERICAN: 98 mL/min/{1.73_m2} (ref >=60)
GFR, Est Non African American: 85 mL/min/{1.73_m2} (ref >=60)
GLUCOSE: 117 mg/dL — AB (ref 65–99)
Globulin: 2.5 g/dL (calc) (ref 1.9–3.7)
Potassium: 4.7 mmol/L (ref 3.5–5.3)
Sodium: 138 mmol/L (ref 135–146)
TOTAL PROTEIN: 6.6 g/dL (ref 6.1–8.1)

## 2018-08-09 LAB — LIPID PANEL
CHOL/HDL RATIO: 3.1 (calc) (ref <5.0)
Cholesterol: 122 mg/dL (ref <200)
HDL: 40 mg/dL — ABNORMAL LOW (ref >40)
LDL CHOLESTEROL (CALC): 69 mg/dL
Non-HDL Cholesterol (Calc): 82 mg/dL (calc) (ref <130)
Triglycerides: 57 mg/dL (ref <150)

## 2018-08-09 LAB — MICROALBUMIN / CREATININE URINE RATIO
CREATININE, URINE: 158 mg/dL (ref 20–320)
Microalb Creat Ratio: 7 mcg/mg creat (ref <30)
Microalb, Ur: 1.1 mg/dL

## 2018-08-09 LAB — HEMOGLOBIN A1C
EAG (MMOL/L): 6.3 (calc)
HEMOGLOBIN A1C: 5.6 %{Hb} (ref <5.7)
MEAN PLASMA GLUCOSE: 114 (calc)

## 2018-09-19 DIAGNOSIS — N401 Enlarged prostate with lower urinary tract symptoms: Secondary | ICD-10-CM | POA: Diagnosis not present

## 2018-09-19 DIAGNOSIS — Z125 Encounter for screening for malignant neoplasm of prostate: Secondary | ICD-10-CM | POA: Diagnosis not present

## 2018-10-09 ENCOUNTER — Other Ambulatory Visit: Payer: Self-pay | Admitting: Family Medicine

## 2018-10-10 ENCOUNTER — Telehealth: Payer: Self-pay | Admitting: Family Medicine

## 2018-10-10 NOTE — Telephone Encounter (Signed)
Pt is returning jamie call °

## 2018-10-10 NOTE — Telephone Encounter (Signed)
See refill encounter from 10/09/18 Please contact patient and verify how he is taking the metformin Current med list does not match what is requested

## 2018-10-10 NOTE — Telephone Encounter (Signed)
Please contact patient and verify how he is taking the metformin Current med list does not match what is requested

## 2018-10-10 NOTE — Telephone Encounter (Signed)
Left detailed voicemail

## 2018-10-10 NOTE — Telephone Encounter (Signed)
Pt called back.  Copied from [place] [phone]. Topic: Quick Communication - See Telephone Encounter >> Oct 10, 2018  3:13 PM Cathrine Muster, CMA wrote: CRM for notification. See Telephone encounter for: 10/10/18.  Nurse able to advise

## 2018-10-11 NOTE — Telephone Encounter (Signed)
Attempted to call patient regarding Metformin. No answer. Unable to leave message.

## 2018-10-11 NOTE — Telephone Encounter (Signed)
Called patient back he states you had told him to only take 2 pills instead of 3, I have updated in chart.  He does not need refill at this time.

## 2018-10-24 DIAGNOSIS — H353111 Nonexudative age-related macular degeneration, right eye, early dry stage: Secondary | ICD-10-CM | POA: Diagnosis not present

## 2018-10-28 ENCOUNTER — Telehealth: Payer: Self-pay | Admitting: Family Medicine

## 2018-10-28 ENCOUNTER — Ambulatory Visit: Payer: 59 | Admitting: Family Medicine

## 2018-10-28 ENCOUNTER — Encounter: Payer: Self-pay | Admitting: Family Medicine

## 2018-10-28 VITALS — BP 142/77 | HR 61 | Temp 97.8°F | Ht 72.0 in | Wt 168.0 lb

## 2018-10-28 DIAGNOSIS — E1142 Type 2 diabetes mellitus with diabetic polyneuropathy: Secondary | ICD-10-CM | POA: Diagnosis not present

## 2018-10-28 DIAGNOSIS — L97521 Non-pressure chronic ulcer of other part of left foot limited to breakdown of skin: Secondary | ICD-10-CM

## 2018-10-28 DIAGNOSIS — I119 Hypertensive heart disease without heart failure: Secondary | ICD-10-CM

## 2018-10-28 DIAGNOSIS — Z23 Encounter for immunization: Secondary | ICD-10-CM | POA: Diagnosis not present

## 2018-10-28 DIAGNOSIS — E782 Mixed hyperlipidemia: Secondary | ICD-10-CM

## 2018-10-28 DIAGNOSIS — Z5181 Encounter for therapeutic drug level monitoring: Secondary | ICD-10-CM

## 2018-10-28 DIAGNOSIS — Z955 Presence of coronary angioplasty implant and graft: Secondary | ICD-10-CM | POA: Diagnosis not present

## 2018-10-28 DIAGNOSIS — I2510 Atherosclerotic heart disease of native coronary artery without angina pectoris: Secondary | ICD-10-CM

## 2018-10-28 MED ORDER — LISINOPRIL 20 MG PO TABS: 30.0000 mg | ORAL_TABLET | Freq: Every day | ORAL | 0 refills | Status: DC

## 2018-10-28 NOTE — Assessment & Plan Note (Signed)
Low HDL, LDL at goal; continue statin; check lipids

## 2018-10-28 NOTE — Patient Instructions (Addendum)
Try to follow the DASH guidelines (DASH stands for Dietary Approaches to Stop Hypertension). Try to limit the sodium in your diet to no more than 1,500mg  of sodium per day. Certainly try to not exceed 2,000 mg per day at the very most. Do not add salt when cooking or at the table.  Check the sodium amount on labels when shopping, and choose items lower in sodium when given a choice. Avoid or limit foods that already contain a lot of sodium. Eat a diet rich in fruits and vegetables and whole grains, and try to lose weight if overweight or obese  Increase the lisinopril from 20 mg daily to 30 mg daily  Return to see [name] or [name] in 2 weeks for a recheck blood pressure and BMP (non-fasting lab)  I do encourage you to quit smoking Call [phone] to sign up for smoking cessation classes You can call 1-800-QUIT-NOW to talk with a smoking cessation coach  Keep a close eye on your toe and contact me right away if it appears infected, goes purple, becomes painful, etc.  Try to limit saturated fats in your diet (bologna, hot dogs, barbeque, cheeseburgers, hamburgers, steak, bacon, sausage, cheese, etc.) and get more fresh fruits, vegetables, and whole grains

## 2018-10-28 NOTE — Assessment & Plan Note (Signed)
Follow up with cardiologist

## 2018-10-28 NOTE — Assessment & Plan Note (Signed)
Systolic not to goal; increase ACE-I; return in 2 weeks for BP check with CMA and a BMP at that time; goal systolic less than 837

## 2018-10-28 NOTE — Telephone Encounter (Signed)
Copied from [place] [id number]. Topic: General - Other >> Oct 28, 2018 10:30 AM Alfredia Ferguson R wrote: Patient called in and stated he couldn't pick up his Lisinopril 30 mg Oral Daily due to pharmacy stating he couldn't refill until 01/18. Med was changed from 20-30 at todays visit

## 2018-10-28 NOTE — Assessment & Plan Note (Signed)
Check liver and kidneys 

## 2018-10-28 NOTE — Progress Notes (Signed)
BP (!) 142/77   Pulse 61   Temp 97.8 F (36.6 C) (Oral)   Ht 6' (1.829 m)   Wt 168 lb (76.2 kg)   SpO2 99%   BMI 22.78 kg/m    Subjective:    Patient ID: Brian Dean, male    DOB: 1956/02/21, 62 y.o.   MRN: [id number]  HPI: Brian Dean is a 62 y.o. male  Chief Complaint  Patient presents with  . Follow-up    HPI Patient is here for f/u  He was working on the tractor end of August Two big blisters on the left toe; one popped and the distal one and the toenail and callus pulled off Wife had a fit, wanted him to go to the doctor "It's doing good now"; "It was ugly there for a while" Kept it clean, used antibiotic ointment; "it's about healed up"  Type 2 diabetes; he thinks the numbers are about the same Not checking FSBS with my blessing Reduced metformin last visit Drinks water and coffee; occasional sweet tea once a week Wheat bread or biscuits  High cholesterol; HDL 40, LDL 69 On statin; room for improvement with diet  Coronary artery disease No chest pain Episode of something Saturday night; went out to eat at [employer] then felt nausated and felt hot, then felt like he was going to throw up; got out in the cold air, then felt fine; no chest pain; something tied upper abd in knots just for a little bit; still has gallbladder; he did not turn yellow; does not want to see a surgeon about his gallstone, gallbladder  He saw dentist and then oral surgeon; they checked him out and told him they need to pull a tooth, then get an implant; Dec 7th, went to bed that night, took dentures out, something poking out of his gum, asked wife to pull something out with a set of tweezers, but she wouldn't do it, so he pulled it out, little sharp piece of a tooth he got out  Depression screen [facility] 2/9 10/28/2018 10/28/2018 04/27/2018 10/27/2017 04/27/2017  Decreased Interest 0 0 0 0 0  Down, Depressed, Hopeless 0 0 0 0 0  PHQ - 2 Score 0 0 0 0 0  Altered sleeping 0 - - - -    Tired, decreased energy 0 - - - -  Change in appetite 0 - - - -  Feeling bad or failure about yourself  0 - - - -  Trouble concentrating 0 - - - -  Moving slowly or fidgety/restless 0 - - - -  Suicidal thoughts 0 - - - -  PHQ-9 Score 0 - - - -  Difficult doing work/chores Not difficult at all - - - -   Fall Risk  10/28/2018 04/27/2018 10/27/2017 04/27/2017 10/27/2016  Falls in the past year? 0 No No No No  Number falls in past yr: 0 - - - -  Injury with Fall? 0 - - - -    Relevant past medical, surgical, family and social history reviewed Past Medical History:  Diagnosis Date  . Arthritis   . CAD (coronary artery disease) Nov 2014   diffuse, aggresive risk factor modification recommended  . Diabetes mellitus without complication ([place])   . Diabetic neuropathy ([place])   . Dyslipidemia   . ED (erectile dysfunction)   . GERD (gastroesophageal reflux disease)   . History of acute pyelonephritis Aug. 2014   hospitalized; 100k E. coli pansensitive  .  Hypercholesteremia   . Hyperlipidemia   . Hypertension   . Hypertensive heart disease   . Inguinal hernia   . Sciatica   . Syncope and collapse   . Tobacco use   . Unspecified systolic (congestive) heart failure [facility])    Past Surgical History:  Procedure Laterality Date  . CARDIAC CATHETERIZATION  11/14   [facility] x1  . [facility]  [facility]  . COLONOSCOPY WITH PROPOFOL N/A 06/22/2016   Procedure: COLONOSCOPY WITH PROPOFOL;  Surgeon: Robert Bellow, MD;  Location[facility] ENDOSCOPY;  Service: Endoscopy;  Laterality: N/A;  . ELBOW ARTHROSCOPY  2001   lt  . GUM SURGERY    . OLECRANON BURSECTOMY  11/12/2011   Procedure: OLECRANON BURSA;  Surgeon: Linna Hoff;  Location: [place];  Service: Orthopedics;  Laterality: Right;  olecracnon bursectomy with delayed closure  . WISDOM TOOTH EXTRACTION     Family History  Problem Relation Age of Onset  . Hypertension Father   . Diabetes Father   . Heart disease Father    . Diabetes Mother   . Heart disease Mother   . Hypertension Mother   . Cancer Mother        bladder  . Diabetes Sister   . Stroke Sister        mini stroke  . Diabetes Brother   . Diabetes Sister   . Diabetes Sister   . Diabetes Sister   . Diabetes Sister    Social History   Tobacco Use  . Smoking status: Current Every Day Smoker    Packs/day: 0.50    Years: 40.00    Pack years: 20.00    Types: Cigarettes    Last attempt to quit: 11/09/2012    Years since quitting: 5.9  . Smokeless tobacco: Never Used  Substance Use Topics  . Alcohol use: Yes    Alcohol/week: 0.0 standard drinks    Comment: occasional  . Drug use: No     Office Visit from 10/28/2018 [facility]  AUDIT-C Score  1      Interim medical history since last visit reviewed. Allergies and medications reviewed  Review of Systems Per HPI unless specifically indicated above     Objective:    BP (!) 142/77   Pulse 61   Temp 97.8 F (36.6 C) (Oral)   Ht 6' (1.829 m)   Wt 168 lb (76.2 kg)   SpO2 99%   BMI 22.78 kg/m   Wt Readings from Last 3 Encounters:  10/28/18 168 lb (76.2 kg)  04/27/18 166 lb 3.2 oz (75.4 kg)  12/13/17 170 lb 8 oz (77.3 kg)    Physical Exam Constitutional:      General: He is not in acute distress.    Appearance: He is well-developed.  HENT:     Head: Normocephalic and atraumatic.  Eyes:     General: No scleral icterus. Neck:     Thyroid: No thyromegaly.  Cardiovascular:     Rate and Rhythm: Normal rate and regular rhythm.  Pulmonary:     Effort: Pulmonary effort is normal.     Breath sounds: Normal breath sounds.  Abdominal:     General: Bowel sounds are normal. There is no distension.     Palpations: Abdomen is soft.  Skin:    General: Skin is warm and dry.     Coloration: Skin is not pale.  Neurological:     Coordination: Coordination normal.  Psychiatric:  Behavior: Behavior normal.        Thought Content: Thought content  normal.        Judgment: Judgment normal.    Diabetic Foot Form - Detailed   Diabetic Foot Exam - detailed Diabetic Foot exam was performed with the following findings:  Yes 10/28/2018  8:33 AM  Visual Foot Exam completed.:  Yes  Pulse Foot Exam completed.:  Yes  Right Dorsalis Pedis:  Present Left Dorsalis Pedis:  Present  Sensory Foot Exam Completed.:  Yes Semmes-Weinstein Monofilament Test R Site 1-Great Toe:  Pos L Site 1-Great Toe:  Neg    Comments:  Scabbed ulcerated area distal medial left great toe; no evidence to suggest infection; no red streaking; appears to be healing     Results for orders placed or performed in visit on 05/24/18  HM DIABETES EYE EXAM  Result Value Ref Range   HM Diabetic Eye Exam No Retinopathy No Retinopathy      Assessment & Plan:   Problem List Items Addressed This Visit      Cardiovascular and Mediastinum   Hypertensive heart disease (Chronic)    Systolic not to goal; increase ACE-I; return in 2 weeks for BP check with CMA and a BMP at that time; goal systolic less than 892      Relevant Medications   lisinopril (PRINIVIL,ZESTRIL) 20 MG tablet   CAD (coronary artery disease) (Chronic)    Follow-up with cardiologist; continue meds      Relevant Medications   lisinopril (PRINIVIL,ZESTRIL) 20 MG tablet     Endocrine   Diabetic neuropathy ([place]) - Primary (Chronic)    Foot exam by MD; check A1c      Relevant Medications   lisinopril (PRINIVIL,ZESTRIL) 20 MG tablet   Other Relevant Orders   Hemoglobin A1C     Other   S/P coronary artery stent placement    Follow-up with cardiologist      Medication monitoring encounter    Check liver and kidneys      Relevant Orders   COMPLETE METABOLIC PANEL WITH GFR   Hyperlipidemia    Low HDL, LDL at goal; continue statin; check lipids      Relevant Medications   lisinopril (PRINIVIL,ZESTRIL) 20 MG tablet   Other Relevant Orders   Lipid panel    Other Visit Diagnoses    Skin  ulcer of left foot, limited to breakdown of skin ([place])       patient refused referral to wound clinic, foot doctor; keep a close close eye on that area and let me know of any infection, change in color   Need for influenza vaccination       Relevant Orders   Flu Vaccine QUAD 6+ mos PF IM (Fluarix Quad PF) (Completed)       Follow up plan: Return in about 2 weeks (around 11/11/2018) for blood pressure recheck and BMP with CMA; 6 months with Dr. Sanda Klein (or just after).  An after-visit summary was printed and given to the patient at [place].  Please see the patient instructions which may contain other information and recommendations beyond what is mentioned above in the assessment and plan.  Meds ordered this encounter  Medications  . lisinopril (PRINIVIL,ZESTRIL) 20 MG tablet    Sig: Take 1.5 tablets (30 mg total) by mouth daily.    Dispense:  135 tablet    Refill:  0    New instructions    Orders Placed This Encounter  Procedures  . Flu Vaccine  QUAD 6+ mos PF IM (Fluarix Quad PF)  . Hemoglobin A1C  . COMPLETE METABOLIC PANEL WITH GFR  . Lipid panel

## 2018-10-28 NOTE — Telephone Encounter (Signed)
Pt notified he will need to take one whole pill and cut one in half to make correct dosage.

## 2018-10-28 NOTE — Assessment & Plan Note (Signed)
Foot exam by MD; check A1c

## 2018-10-28 NOTE — Assessment & Plan Note (Signed)
Follow-up with cardiologist; continue meds

## 2018-10-29 LAB — COMPLETE METABOLIC PANEL WITH GFR
AG Ratio: 1.5 (calc) (ref 1.0–2.5)
ALT: 22 U/L (ref 9–46)
AST: 32 U/L (ref 10–35)
Albumin: 4.3 g/dL (ref 3.6–5.1)
Alkaline phosphatase (APISO): 57 U/L (ref 40–115)
BUN: 18 mg/dL (ref 7–25)
CO2: 25 mmol/L (ref 20–32)
Calcium: 9.6 mg/dL (ref 8.6–10.3)
Chloride: 105 mmol/L (ref 98–110)
Creat: 0.92 mg/dL (ref 0.70–1.25)
GFR, EST NON AFRICAN AMERICAN: 89 mL/min/{1.73_m2} (ref >=60)
GFR, Est African American: 103 mL/min/{1.73_m2} (ref >=60)
GLOBULIN: 2.8 g/dL (ref 1.9–3.7)
Glucose, Bld: 121 mg/dL — ABNORMAL HIGH (ref 65–99)
Potassium: 4.9 mmol/L (ref 3.5–5.3)
Sodium: 137 mmol/L (ref 135–146)
Total Bilirubin: 1 mg/dL (ref 0.2–1.2)
Total Protein: 7.1 g/dL (ref 6.1–8.1)

## 2018-10-29 LAB — LIPID PANEL
CHOL/HDL RATIO: 2.9 (calc) (ref <5.0)
Cholesterol: 134 mg/dL (ref <200)
HDL: 46 mg/dL (ref >40)
LDL Cholesterol (Calc): 74 mg/dL (calc)
NON-HDL CHOLESTEROL (CALC): 88 mg/dL (ref <130)
Triglycerides: 59 mg/dL (ref <150)

## 2018-10-29 LAB — HEMOGLOBIN A1C
EAG (MMOL/L): 6.3 (calc)
Hgb A1c MFr Bld: 5.6 % of total Hgb (ref <5.7)
Mean Plasma Glucose: 114 (calc)

## 2018-11-11 ENCOUNTER — Other Ambulatory Visit: Payer: Self-pay | Admitting: Family Medicine

## 2018-11-11 ENCOUNTER — Ambulatory Visit: Payer: 59

## 2018-11-11 VITALS — BP 136/82 | HR 86

## 2018-11-11 DIAGNOSIS — Z5181 Encounter for therapeutic drug level monitoring: Secondary | ICD-10-CM | POA: Diagnosis not present

## 2018-11-11 DIAGNOSIS — I159 Secondary hypertension, unspecified: Secondary | ICD-10-CM

## 2018-11-11 LAB — BASIC METABOLIC PANEL WITH GFR
BUN: 12 mg/dL (ref 7–25)
CO2: 26 mmol/L (ref 20–32)
Calcium: 8.8 mg/dL (ref 8.6–10.3)
Chloride: 104 mmol/L (ref 98–110)
Creat: 0.9 mg/dL (ref 0.70–1.25)
GFR, Est African American: 106 mL/min/{1.73_m2} (ref >=60)
GFR, Est Non African American: 91 mL/min/{1.73_m2} (ref >=60)
GLUCOSE: 134 mg/dL — AB (ref 65–99)
Potassium: 4.1 mmol/L (ref 3.5–5.3)
Sodium: 138 mmol/L (ref 135–146)

## 2018-11-11 MED ORDER — LISINOPRIL 30 MG PO TABS: 30.0000 mg | ORAL_TABLET | Freq: Every day | ORAL | 3 refills | Status: DC

## 2018-11-11 NOTE — Progress Notes (Signed)
Pt here for blood pressure, was high at last visit and Lisinopril was increased to 20mg  take 1.5tab daily.  Patient denies any side effects. Blood pressure today is 136/82 and pulse 86. Consulted with Dr. Sanda Klein and we will continue current regimen. Patient will get BMP today and I added new antibiotic to med list that patient started due to tooth infection.

## 2018-11-11 NOTE — Progress Notes (Signed)
New Rx for 30 mg lisinopril

## 2018-11-15 ENCOUNTER — Other Ambulatory Visit: Payer: Self-pay | Admitting: Cardiovascular Disease

## 2018-11-15 ENCOUNTER — Other Ambulatory Visit: Payer: Self-pay | Admitting: Family Medicine

## 2018-11-15 NOTE — Telephone Encounter (Signed)
Lab Results  Component Value Date   CREATININE 0.90 11/11/2018   Lab Results  Component Value Date   HGBA1C 5.6 10/28/2018   Please clarify with patient how he is taking the metformin Our list says one pill twice a day; I decreased his dose to BID in June 2019 Refill request is for one pill three times a day

## 2018-11-16 MED ORDER — METFORMIN HCL 500 MG PO TABS: 500.0000 mg | ORAL_TABLET | Freq: Two times a day (BID) | ORAL | 1 refills | Status: DC

## 2018-11-16 NOTE — Telephone Encounter (Signed)
Pt returning [place] call stating that he talks Metformin 1 Tab twice a day please call pt at [phone]

## 2018-11-16 NOTE — Telephone Encounter (Signed)
Left detailed voicemail to call back

## 2018-11-21 ENCOUNTER — Other Ambulatory Visit: Payer: Self-pay | Admitting: Family Medicine

## 2018-12-12 NOTE — Progress Notes (Signed)
Cardiology Office Note  Date:  12/19/2018   ID:  Tyrail, Brian Dean 09/15/1956, MRN [id number]  PCP:  Arnetha Courser, MD   Chief Complaint  Patient presents with  . other    12 mo. follow up. Medications reviewed verbally. "Doing well"    HPI:  [name] is a 63 y.o. retired, gentleman with a history of  Diabetes  Hypertension  Coronary Artery Disease Coronary Artery Stent Placement, proximal left circumflex, 09/25/2013 Long smoking history, 0.5 ppd, 20 pack years No longer vapes  Gallstone Prostate issues Positive Stress test Who presents for follow up of of Coronary Artery Disease   INTERVAL HISTORY:  He presents for 12 month follow up accompanied by his wife.  He continues to have a low heart beat.  But feels he is asymptomatic  he reports doing well overall.  No regular exercise program  He has normal energy levels and has not felt any chest discomfort.  This past December he had a dizzy spell with associated abdominal pain across his lower abdomen. He went outside and his symptoms subsided.   Had some marijuana this morning, along with three cups of coffee Continues to smoke 5 cigarettes/day  This past week he experienced one episode of  swelling to his left ankle. It has since resolved.   He reports taking 2 aspirin pills a day.   Of note, this past summer he started experiencing pain in his mouth. He went to his dentist and found he had infections in his mouth and was prescribed antibiotics x 2  Lab work reviewed with him on today's visit Total chol 134/ LDL 74 A1C 5.6  EKG personally reviewed by myself on todays visit showed sinus bradycardia LAFB, ST and T wave abnormality  OTHER PAST MEDICAL HISTORY REVIEWED BY ME FOR TODAY'S VISIT: 2019 12/13/2017 EKG showed sinus bradycardia rate 49 bpm T wave abnormality lead III aVF Similar to previous EKG 2017  2014 09/25/2013 Stent placed to his proximal left circumflex 09/15/2013 Cardiac catheterization  showed severe proximal left circumflex disease, 50% lesions in his LAD, 40% RCA disease. 07/26/2013 Previous Stress Myoview showed what appears to be a proximally occluded RCA with peri-infarct ischemia.  Left anterior descending and left circumflex territory appear intact with no perfusion abnormality. Ejection fraction 47% with wall motion abnormality in the inferior wall. 07/08/2013 - 07/09/2013 Hospitalized for acute pyelonephritis, acute on chronic low back pain, hyponatremia, diabetes, hypertension 07/07/2013 CT scan shows large calcified stone in the gallbladder neck, enlarged prostate He denied having any symptoms from his gallbladder stones     PMH:   has a past medical history of Arthritis, CAD (coronary artery disease) (Nov 2014), Diabetes mellitus without complication ([place]), Diabetic neuropathy ([place]), Dyslipidemia, ED (erectile dysfunction), GERD (gastroesophageal reflux disease), History of acute pyelonephritis (Aug. 2014), Hypercholesteremia, Hyperlipidemia, Hypertension, Hypertensive heart disease, Inguinal hernia, Sciatica, Syncope and collapse, Tobacco use, and Unspecified systolic (congestive) heart failure ([place]).  PSH:    Past Surgical History:  Procedure Laterality Date  . CARDIAC CATHETERIZATION  11/14   [facility] x1  . [facility]  [facility]  . COLONOSCOPY WITH PROPOFOL N/A 06/22/2016   Procedure: COLONOSCOPY WITH PROPOFOL;  Surgeon: Robert Bellow, MD;  Location: [facility] ENDOSCOPY;  Service: Endoscopy;  Laterality: N/A;  . ELBOW ARTHROSCOPY  2001   lt  . GUM SURGERY    . OLECRANON BURSECTOMY  11/12/2011   Procedure: OLECRANON BURSA;  Surgeon: Linna Hoff;  Location: [place];  Service: Orthopedics;  Laterality: Right;  olecracnon bursectomy with delayed closure  . WISDOM TOOTH EXTRACTION      Current Outpatient Medications  Medication Sig Dispense Refill  . aspirin 81 MG tablet Take 81 mg by mouth daily. Take 1 tablet (81 mg) by mouth once daily    .  CINNAMON PO Take 1,000 mg by mouth 2 (two) times daily. Reported on 05/06/2016    . finasteride (PROSCAR) 5 MG tablet Take 1 tablet (5 mg total) by mouth daily. 90 tablet 3  . gabapentin (NEURONTIN) 300 MG capsule TAKE 1 CAPSULE BY MOUTH EVERYDAY AT BEDTIME 90 capsule 3  . lisinopril (PRINIVIL,ZESTRIL) 30 MG tablet Take 1 tablet (30 mg total) by mouth daily. 90 tablet 3  . metFORMIN (GLUCOPHAGE) 500 MG tablet Take 1 tablet (500 mg total) by mouth 2 (two) times daily with a meal. 180 tablet 1  . nitroGLYCERIN (NITROSTAT) 0.4 MG SL tablet Place 1 tablet (0.4 mg total) under the tongue every 5 (five) minutes as needed. 25 tablet 3  . pantoprazole (PROTONIX) 40 MG tablet TAKE 1 TABLET BY MOUTH EVERY DAY 90 tablet 0  . pyridOXINE (VITAMIN B-6) 100 MG tablet Take 100 mg by mouth daily. Reported on 05/06/2016    . amoxicillin (AMOXIL) 875 MG tablet Take 875 mg by mouth 2 (two) times daily.    Marland Kitchen atorvastatin (LIPITOR) 20 MG tablet Take 1 tablet (20 mg total) by mouth daily. 90 tablet 3  . tadalafil (CIALIS) 5 MG tablet Take 5 mg by mouth daily. Reported on 05/06/2016     No current facility-administered medications for this visit.      Allergies:   Patient has no known allergies.   Social History:  The patient  reports that he has been smoking cigarettes. He has a 20.00 pack-year smoking history. He has never used smokeless tobacco. He reports current alcohol use. He reports that he does not use drugs.   Family History:   family history includes Cancer in his mother; Diabetes in his brother, father, mother, sister, sister, sister, sister, and sister; Heart disease in his father and mother; Hypertension in his father and mother; Stroke in his sister.    Review of Systems: Review of Systems  Constitutional: Negative.   Respiratory: Negative.   Cardiovascular: Negative.   Gastrointestinal: Negative.   Musculoskeletal: Negative.   Neurological: Negative.   Psychiatric/Behavioral: Negative.   All  other systems reviewed and are negative.    PHYSICAL EXAM: VS:  BP 140/82 (BP Location: Left Arm, Patient Position: Sitting, Cuff Size: Normal)   Pulse (!) 48   Ht 6' (1.829 m)   Wt 168 lb (76.2 kg)   BMI 22.78 kg/m  , BMI Body mass index is 22.78 kg/m.  Constitutional:  oriented to person, place, and time. No distress.  HENT:  Head: Grossly normal Eyes:  no discharge. No scleral icterus.  Neck: No JVD, no carotid bruits  Cardiovascular: Regular rate and rhythm, no murmurs appreciated Pulmonary/Chest: Clear to auscultation bilaterally, no wheezes or rails Abdominal: Soft.  no distension.  no tenderness.  Musculoskeletal: Normal range of motion Neurological:  normal muscle tone. Coordination normal. No atrophy Skin: Skin warm and dry Psychiatric: normal affect, pleasant   Recent Labs: 10/28/2018: ALT 22 11/11/2018: BUN 12; Creat 0.90; Potassium 4.1; Sodium 138    Lipid Panel Lab Results  Component Value Date   CHOL 134 10/28/2018   HDL 46 10/28/2018   LDLCALC 74 10/28/2018   TRIG 59 10/28/2018  WEIGHT LOSS: Wt Readings from Last 3 Encounters:  12/19/18 168 lb (76.2 kg)  10/28/18 168 lb (76.2 kg)  04/27/18 166 lb 3.2 oz (75.4 kg)      ASSESSMENT AND PLAN:  Hypertensive heart disease without heart failure Plan: EKG 12-Lead Blood pressure is well controlled on today's visit. No changes made to the medications. Refill NTG Stable  Coronary artery disease involving native coronary artery of native heart without angina pectoris Plan: Currently with no symptoms of angina. No further workup at this time. We did discuss anginal symptoms to watch for.  continue current medication regimen.  Active, stable  Mixed hyperlipidemia Plan: LDL is above goal. Increase Lipitor to 20 mg daily. Recommended strict diet Goal LDL less than 70.  If numbers continue to run high may need to increase Lipitor further or add Zetia.  He prefers not to add additional medications  Type  2 diabetes mellitus without complication, without long-term current use of insulin ([place]) Plan: A1C 5.6, stable  Smoker Plan: Unchanged. Recommend quitting. Declined Chantix in the past Cigarettes and marijuana  S/P coronary artery stent placement Recommend he decrease his dose from 2 pills per day to 1 pill a day Discussed bleeding risk Previously took himself off Plavix, too much bleeding   Bradycardia  Plan: Typically runs in the 50s Asymptomatic, no further workup at this time Perhaps because he used marijuana this morning and feels relaxed  DENTAL Plan: Continue seeing dentist. Use mouth wash to help kill bacteria Discussed the importance of dental hygiene as it pertains to inflammation and cardiac disease    Total encounter time more than 25 minutes Greater than 50% was spent in counseling and coordination of care with the patient   Disposition:   F/U  12 months   I, Margit Banda am acting as a Education administrator for Ida Rogue, M.D., Ph.D.  I have reviewed the above documentation for accuracy and completeness, and I agree with the above.   Signed, Esmond Plants, M.D., Ph.D. 12/19/2018  [place], [place]

## 2018-12-14 ENCOUNTER — Other Ambulatory Visit: Payer: Self-pay | Admitting: Family Medicine

## 2018-12-14 NOTE — Telephone Encounter (Signed)
Lab Results  Component Value Date   ALT 22 10/28/2018   ALT 34 07/08/2013   Lab Results  Component Value Date   CHOL 134 10/28/2018   HDL 46 10/28/2018   LDLCALC 74 10/28/2018   TRIG 59 10/28/2018   CHOLHDL 2.9 10/28/2018

## 2018-12-16 DIAGNOSIS — H6123 Impacted cerumen, bilateral: Secondary | ICD-10-CM | POA: Diagnosis not present

## 2018-12-16 DIAGNOSIS — H9319 Tinnitus, unspecified ear: Secondary | ICD-10-CM | POA: Diagnosis not present

## 2018-12-16 DIAGNOSIS — H903 Sensorineural hearing loss, bilateral: Secondary | ICD-10-CM | POA: Diagnosis not present

## 2018-12-16 NOTE — Patient Instructions (Addendum)
Medication Instructions:  Please increase the atorvastatin up to 20 mg daily  Decrease aspirin to 81 mg once daily  If you need a refill on your cardiac medications before your next appointment, please call your pharmacy.    Lab work: No new labs needed   If you have labs (blood work) drawn today and your tests are completely normal, you will receive your results only by:  [place] (if you have MyChart) OR  A paper copy in the mail If you have any lab test that is abnormal or we need to change your treatment, we will call you to review the results.   Testing/Procedures: No new testing needed   Follow-Up: At [facility], you and your health needs are our priority.  As part of our continuing mission to provide you with exceptional heart care, we have created designated Provider Care Teams.  These Care Teams include your primary Cardiologist (physician) and Advanced Practice Providers (APPs -  Physician Assistants and Nurse Practitioners) who all work together to provide you with the care you need, when you need it.   You will need a follow up appointment in 12 months    Please call our office 2 months in advance to schedule this appointment.     Providers on your designated Care Team:    Murray Hodgkins, NP  Christell Faith, PA-C  Marrianne Mood, PA-C  Any Other Special Instructions Will Be Listed Below (If Applicable).  For educational health videos Log in to : [email or web address] Or : [email or web address], password : [email or web address]

## 2018-12-19 ENCOUNTER — Encounter: Payer: Self-pay | Admitting: Cardiovascular Disease

## 2018-12-19 ENCOUNTER — Ambulatory Visit: Payer: 59 | Admitting: Cardiovascular Disease

## 2018-12-19 VITALS — BP 140/82 | HR 48 | Ht 72.0 in | Wt 168.0 lb

## 2018-12-19 DIAGNOSIS — F17200 Nicotine dependence, unspecified, uncomplicated: Secondary | ICD-10-CM

## 2018-12-19 DIAGNOSIS — E119 Type 2 diabetes mellitus without complications: Secondary | ICD-10-CM

## 2018-12-19 DIAGNOSIS — I25118 Atherosclerotic heart disease of native coronary artery with other forms of angina pectoris: Secondary | ICD-10-CM

## 2018-12-19 DIAGNOSIS — I119 Hypertensive heart disease without heart failure: Secondary | ICD-10-CM | POA: Diagnosis not present

## 2018-12-19 DIAGNOSIS — Z955 Presence of coronary angioplasty implant and graft: Secondary | ICD-10-CM

## 2018-12-19 DIAGNOSIS — E782 Mixed hyperlipidemia: Secondary | ICD-10-CM

## 2018-12-19 MED ORDER — ATORVASTATIN CALCIUM 20 MG PO TABS: 20.0000 mg | ORAL_TABLET | Freq: Every day | ORAL | 3 refills | Status: DC

## 2018-12-19 MED ORDER — NITROGLYCERIN 0.4 MG SL SUBL: 0.4000 mg | SUBLINGUAL_TABLET | SUBLINGUAL | 3 refills | Status: DC | PRN

## 2018-12-20 NOTE — Addendum Note (Signed)
Addended by: Alba Destine on: 12/20/2018 04:33 PM   Modules accepted: Orders

## 2019-01-03 ENCOUNTER — Ambulatory Visit
Admission: RE | Admit: 2019-01-03 | Discharge: 2019-01-03 | Disposition: A | Discharge status: Home / Self Care | Payer: 59 | Source: Ambulatory Visit | Attending: Family Medicine | Admitting: Family Medicine

## 2019-01-03 ENCOUNTER — Encounter: Payer: Self-pay | Admitting: Family Medicine

## 2019-01-03 ENCOUNTER — Ambulatory Visit
Admission: RE | Admit: 2019-01-03 | Discharge: 2019-01-03 | Disposition: A | Discharge status: Home / Self Care | Payer: 59 | Source: Home / Self Care | Attending: Family Medicine | Admitting: Family Medicine

## 2019-01-03 ENCOUNTER — Other Ambulatory Visit: Payer: Self-pay

## 2019-01-03 ENCOUNTER — Encounter: Payer: Self-pay | Admitting: Emergency Medicine

## 2019-01-03 ENCOUNTER — Other Ambulatory Visit
Admission: RE | Admit: 2019-01-03 | Discharge: 2019-01-03 | Disposition: A | Discharge status: Home / Self Care | Payer: 59 | Source: Home / Self Care | Attending: Family Medicine | Admitting: Family Medicine

## 2019-01-03 ENCOUNTER — Inpatient Hospital Stay
Admission: EM | Admit: 2019-01-03 | Discharge: 2019-01-05 | DRG: 617 | Disposition: A | Discharge status: Home / Self Care | Payer: 59 | Attending: Internal Medicine | Admitting: Internal Medicine

## 2019-01-03 ENCOUNTER — Ambulatory Visit: Payer: 59 | Admitting: Family Medicine

## 2019-01-03 VITALS — BP 130/84 | HR 73 | Temp 97.9°F | Resp 18 | Ht 72.0 in | Wt 165.1 lb

## 2019-01-03 DIAGNOSIS — E1169 Type 2 diabetes mellitus with other specified complication: Principal | ICD-10-CM | POA: Diagnosis present

## 2019-01-03 DIAGNOSIS — I110 Hypertensive heart disease with heart failure: Secondary | ICD-10-CM | POA: Diagnosis present

## 2019-01-03 DIAGNOSIS — B351 Tinea unguium: Secondary | ICD-10-CM | POA: Diagnosis not present

## 2019-01-03 DIAGNOSIS — Z8249 Family history of ischemic heart disease and other diseases of the circulatory system: Secondary | ICD-10-CM | POA: Diagnosis not present

## 2019-01-03 DIAGNOSIS — M7989 Other specified soft tissue disorders: Secondary | ICD-10-CM

## 2019-01-03 DIAGNOSIS — Z809 Family history of malignant neoplasm, unspecified: Secondary | ICD-10-CM

## 2019-01-03 DIAGNOSIS — I2510 Atherosclerotic heart disease of native coronary artery without angina pectoris: Secondary | ICD-10-CM | POA: Diagnosis present

## 2019-01-03 DIAGNOSIS — E11628 Type 2 diabetes mellitus with other skin complications: Secondary | ICD-10-CM

## 2019-01-03 DIAGNOSIS — N400 Enlarged prostate without lower urinary tract symptoms: Secondary | ICD-10-CM | POA: Diagnosis present

## 2019-01-03 DIAGNOSIS — E7800 Pure hypercholesterolemia, unspecified: Secondary | ICD-10-CM | POA: Diagnosis present

## 2019-01-03 DIAGNOSIS — R600 Localized edema: Secondary | ICD-10-CM | POA: Diagnosis not present

## 2019-01-03 DIAGNOSIS — I5022 Chronic systolic (congestive) heart failure: Secondary | ICD-10-CM | POA: Diagnosis present

## 2019-01-03 DIAGNOSIS — I509 Heart failure, unspecified: Secondary | ICD-10-CM | POA: Diagnosis not present

## 2019-01-03 DIAGNOSIS — E1140 Type 2 diabetes mellitus with diabetic neuropathy, unspecified: Secondary | ICD-10-CM | POA: Diagnosis present

## 2019-01-03 DIAGNOSIS — F17210 Nicotine dependence, cigarettes, uncomplicated: Secondary | ICD-10-CM | POA: Diagnosis present

## 2019-01-03 DIAGNOSIS — M869 Osteomyelitis, unspecified: Secondary | ICD-10-CM

## 2019-01-03 DIAGNOSIS — K219 Gastro-esophageal reflux disease without esophagitis: Secondary | ICD-10-CM | POA: Diagnosis present

## 2019-01-03 DIAGNOSIS — Z833 Family history of diabetes mellitus: Secondary | ICD-10-CM | POA: Diagnosis not present

## 2019-01-03 DIAGNOSIS — Z79899 Other long term (current) drug therapy: Secondary | ICD-10-CM | POA: Diagnosis not present

## 2019-01-03 DIAGNOSIS — I10 Essential (primary) hypertension: Secondary | ICD-10-CM | POA: Diagnosis not present

## 2019-01-03 DIAGNOSIS — M79605 Pain in left leg: Secondary | ICD-10-CM

## 2019-01-03 DIAGNOSIS — E1142 Type 2 diabetes mellitus with diabetic polyneuropathy: Secondary | ICD-10-CM

## 2019-01-03 DIAGNOSIS — Z7984 Long term (current) use of oral hypoglycemic drugs: Secondary | ICD-10-CM | POA: Diagnosis not present

## 2019-01-03 DIAGNOSIS — E785 Hyperlipidemia, unspecified: Secondary | ICD-10-CM | POA: Diagnosis present

## 2019-01-03 DIAGNOSIS — M86172 Other acute osteomyelitis, left ankle and foot: Secondary | ICD-10-CM | POA: Diagnosis not present

## 2019-01-03 DIAGNOSIS — N529 Male erectile dysfunction, unspecified: Secondary | ICD-10-CM | POA: Diagnosis present

## 2019-01-03 DIAGNOSIS — Z7982 Long term (current) use of aspirin: Secondary | ICD-10-CM | POA: Diagnosis not present

## 2019-01-03 DIAGNOSIS — E119 Type 2 diabetes mellitus without complications: Secondary | ICD-10-CM | POA: Diagnosis not present

## 2019-01-03 DIAGNOSIS — Z823 Family history of stroke: Secondary | ICD-10-CM

## 2019-01-03 HISTORY — DX: Osteomyelitis, unspecified: M86.9

## 2019-01-03 LAB — URIC ACID: Uric Acid, Serum: 6 mg/dL (ref 3.7–8.6)

## 2019-01-03 LAB — COMPREHENSIVE METABOLIC PANEL
ALT: 19 U/L (ref 0–44)
AST: 22 U/L (ref 15–41)
Albumin: 3.7 g/dL (ref 3.5–5.0)
Alkaline Phosphatase: 48 U/L (ref 38–126)
Anion gap: 7 (ref 5–15)
BUN: 19 mg/dL (ref 8–23)
CO2: 25 mmol/L (ref 22–32)
Calcium: 8.6 mg/dL — ABNORMAL LOW (ref 8.9–10.3)
Chloride: 103 mmol/L (ref 98–111)
Creatinine, Ser: 0.95 mg/dL (ref 0.61–1.24)
GFR calc Af Amer: >60 mL/min (ref >60)
GFR calc non Af Amer: >60 mL/min (ref >60)
Glucose, Bld: 139 mg/dL — ABNORMAL HIGH (ref 70–99)
Potassium: 4 mmol/L (ref 3.5–5.1)
Sodium: 135 mmol/L (ref 135–145)
Total Bilirubin: 1.2 mg/dL (ref 0.3–1.2)
Total Protein: 7.1 g/dL (ref 6.5–8.1)

## 2019-01-03 LAB — CBC WITH DIFFERENTIAL/PLATELET
Abs Immature Granulocytes: 0.03 10*3/uL (ref 0.00–0.07)
BASOS PCT: 0 %
Basophils Absolute: 0 10*3/uL (ref 0.0–0.1)
Eosinophils Absolute: 0.1 10*3/uL (ref 0.0–0.5)
Eosinophils Relative: 1 %
HCT: 33.5 % — ABNORMAL LOW (ref 39.0–52.0)
Hemoglobin: 11.2 g/dL — ABNORMAL LOW (ref 13.0–17.0)
Immature Granulocytes: 0 %
Lymphocytes Relative: 14 %
Lymphs Abs: 1.2 10*3/uL (ref 0.7–4.0)
MCH: 30.1 pg (ref 26.0–34.0)
MCHC: 33.4 g/dL (ref 30.0–36.0)
MCV: 90.1 fL (ref 80.0–100.0)
Monocytes Absolute: 0.5 10*3/uL (ref 0.1–1.0)
Monocytes Relative: 6 %
Neutro Abs: 6.9 10*3/uL (ref 1.7–7.7)
Neutrophils Relative %: 79 %
PLATELETS: 220 10*3/uL (ref 150–400)
RBC: 3.72 MIL/uL — ABNORMAL LOW (ref 4.22–5.81)
RDW: 12.5 % (ref 11.5–15.5)
WBC: 8.8 10*3/uL (ref 4.0–10.5)
nRBC: 0 % (ref 0.0–0.2)

## 2019-01-03 LAB — GLUCOSE, CAPILLARY
Glucose-Capillary: 89 mg/dL (ref 70–99)
Glucose-Capillary: 148 mg/dL — ABNORMAL HIGH (ref 70–99)

## 2019-01-03 LAB — SURGICAL PCR SCREEN
MRSA, PCR: NEGATIVE
Staphylococcus aureus: NEGATIVE

## 2019-01-03 LAB — FIBRIN DERIVATIVES D-DIMER (ARMC ONLY): Fibrin derivatives D-dimer (ARMC): 1334.24 ng/mL (FEU) — ABNORMAL HIGH (ref 0.00–499.00)

## 2019-01-03 LAB — SEDIMENTATION RATE: Sed Rate: 54 mm/hr — ABNORMAL HIGH (ref 0–20)

## 2019-01-03 LAB — C-REACTIVE PROTEIN: CRP: 8.5 mg/dL — ABNORMAL HIGH (ref <1.0)

## 2019-01-03 MED ORDER — TETANUS-DIPHTH-ACELL PERTUSSIS 5-2.5-18.5 LF-MCG/0.5 IM SUSP
0.5000 mL | Freq: Once | INTRAMUSCULAR | Status: AC
  Administered 2019-01-03: 0.5 mL via INTRAMUSCULAR
  Filled 2019-01-03: qty 0.5

## 2019-01-03 MED ORDER — ASPIRIN EC 81 MG PO TBEC
81.0000 mg | DELAYED_RELEASE_TABLET | Freq: Every day | ORAL | Status: DC
  Administered 2019-01-05: 81 mg via ORAL
  Filled 2019-01-03: qty 1

## 2019-01-03 MED ORDER — ACETAMINOPHEN 650 MG RE SUPP: 650.0000 mg | Freq: Four times a day (QID) | RECTAL | Status: DC | PRN

## 2019-01-03 MED ORDER — SODIUM CHLORIDE 0.9 % IV BOLUS
500.0000 mL | Freq: Once | INTRAVENOUS | Status: AC
  Administered 2019-01-03: 500 mL via INTRAVENOUS

## 2019-01-03 MED ORDER — TETANUS-DIPHTH-ACELL PERTUSSIS 5-2.5-18.5 LF-MCG/0.5 IM SUSP
INTRAMUSCULAR | Status: AC
  Filled 2019-01-03: qty 0.5

## 2019-01-03 MED ORDER — LISINOPRIL 20 MG PO TABS
30.0000 mg | ORAL_TABLET | Freq: Every day | ORAL | Status: DC
  Administered 2019-01-04 – 2019-01-05 (×2): 30 mg via ORAL
  Filled 2019-01-03 (×2): qty 1

## 2019-01-03 MED ORDER — VANCOMYCIN HCL 10 G IV SOLR
1500.0000 mg | Freq: Once | INTRAVENOUS | Status: AC
  Administered 2019-01-03: 1500 mg via INTRAVENOUS
  Filled 2019-01-03: qty 1500

## 2019-01-03 MED ORDER — INSULIN ASPART 100 UNIT/ML ~~LOC~~ SOLN
0.0000 [IU] | Freq: Three times a day (TID) | SUBCUTANEOUS | Status: DC
  Administered 2019-01-05: 1 [IU] via SUBCUTANEOUS
  Filled 2019-01-03: qty 0.09
  Filled 2019-01-03: qty 1

## 2019-01-03 MED ORDER — ENOXAPARIN SODIUM 40 MG/0.4ML ~~LOC~~ SOLN
40.0000 mg | SUBCUTANEOUS | Status: DC
  Administered 2019-01-04: 40 mg via SUBCUTANEOUS
  Filled 2019-01-03: qty 0.4

## 2019-01-03 MED ORDER — SODIUM CHLORIDE 0.9 % IV SOLN: 2.0000 g | Freq: Three times a day (TID) | INTRAVENOUS | Status: DC

## 2019-01-03 MED ORDER — GABAPENTIN 300 MG PO CAPS
300.0000 mg | ORAL_CAPSULE | Freq: Every day | ORAL | Status: DC
  Administered 2019-01-03 – 2019-01-04 (×2): 300 mg via ORAL
  Filled 2019-01-03 (×2): qty 1

## 2019-01-03 MED ORDER — METFORMIN HCL 500 MG PO TABS
500.0000 mg | ORAL_TABLET | Freq: Two times a day (BID) | ORAL | Status: DC
  Administered 2019-01-03: 500 mg via ORAL
  Filled 2019-01-03: qty 1

## 2019-01-03 MED ORDER — NITROGLYCERIN 0.4 MG SL SUBL: 0.4000 mg | SUBLINGUAL_TABLET | SUBLINGUAL | Status: DC | PRN

## 2019-01-03 MED ORDER — VITAMIN B-6 50 MG PO TABS
100.0000 mg | ORAL_TABLET | Freq: Every day | ORAL | Status: DC
  Administered 2019-01-04 – 2019-01-05 (×2): 100 mg via ORAL
  Filled 2019-01-03 (×3): qty 2

## 2019-01-03 MED ORDER — ONDANSETRON HCL 4 MG PO TABS: 4.0000 mg | ORAL_TABLET | Freq: Four times a day (QID) | ORAL | Status: DC | PRN

## 2019-01-03 MED ORDER — VANCOMYCIN HCL IN DEXTROSE 1-5 GM/200ML-% IV SOLN
1000.0000 mg | Freq: Two times a day (BID) | INTRAVENOUS | Status: DC
  Administered 2019-01-04 – 2019-01-05 (×3): 1000 mg via INTRAVENOUS
  Filled 2019-01-03 (×4): qty 200

## 2019-01-03 MED ORDER — ONDANSETRON HCL 4 MG/2ML IJ SOLN: 4.0000 mg | Freq: Four times a day (QID) | INTRAMUSCULAR | Status: DC | PRN

## 2019-01-03 MED ORDER — PANTOPRAZOLE SODIUM 40 MG PO TBEC
40.0000 mg | DELAYED_RELEASE_TABLET | Freq: Every day | ORAL | Status: DC
  Administered 2019-01-04 – 2019-01-05 (×2): 40 mg via ORAL
  Filled 2019-01-03 (×3): qty 1

## 2019-01-03 MED ORDER — PIPERACILLIN-TAZOBACTAM 3.375 G IVPB
3.3750 g | Freq: Three times a day (TID) | INTRAVENOUS | Status: DC
  Administered 2019-01-03 – 2019-01-05 (×4): 3.375 g via INTRAVENOUS
  Filled 2019-01-03 (×4): qty 50

## 2019-01-03 MED ORDER — ATORVASTATIN CALCIUM 20 MG PO TABS
20.0000 mg | ORAL_TABLET | Freq: Every day | ORAL | Status: DC
  Administered 2019-01-03 – 2019-01-05 (×3): 20 mg via ORAL
  Filled 2019-01-03 (×3): qty 1

## 2019-01-03 MED ORDER — INSULIN ASPART 100 UNIT/ML ~~LOC~~ SOLN
0.0000 [IU] | Freq: Every day | SUBCUTANEOUS | Status: DC
  Filled 2019-01-03: qty 0.05

## 2019-01-03 MED ORDER — FINASTERIDE 5 MG PO TABS
5.0000 mg | ORAL_TABLET | Freq: Every day | ORAL | Status: DC
  Administered 2019-01-03 – 2019-01-05 (×3): 5 mg via ORAL
  Filled 2019-01-03 (×4): qty 1

## 2019-01-03 MED ORDER — ACETAMINOPHEN 325 MG PO TABS: 650.0000 mg | ORAL_TABLET | Freq: Four times a day (QID) | ORAL | Status: DC | PRN

## 2019-01-03 MED ORDER — MUPIROCIN 2 % EX OINT
1.0000 "application " | TOPICAL_OINTMENT | Freq: Two times a day (BID) | CUTANEOUS | Status: DC
  Administered 2019-01-04: 1 via NASAL
  Filled 2019-01-03: qty 22

## 2019-01-03 NOTE — ED Provider Notes (Signed)
[facility] Emergency Department Provider Note   ____________________________________________   None    (approximate)  I have reviewed the triage vital signs and the nursing notes.   HISTORY  Chief Complaint Toe Pain    HPI Brian Dean is a 63 y.o. male history of coronary disease diabetes  Patient reports sometime around August 2 remembers injuring her foot unexpectedly not really understanding that it happened due to his neuropathy and had a blister over his left foot.  Continue to linger.  Seems to be worsening over time.  Saw primary today today and they informed him he needed to come to the emergency room as a suspected bone infection.  He denies being in pain because he has neuropathy.  He is not had a cold or blue foot.  He denies any nausea vomiting but did have a slight fever at about 99.7 earlier in the week and some slight chills off and on for about a week's time.  No chest pain no trouble breathing.  The left lower ankle area has been a little bit swollen especially across the left great toe with a slight amount of extension into the fourth toe as well.   Past Medical History:  Diagnosis Date  . Arthritis   . CAD (coronary artery disease) Nov 2014   diffuse, aggresive risk factor modification recommended  . Diabetes mellitus without complication ([place])   . Diabetic neuropathy ([place])   . Dyslipidemia   . ED (erectile dysfunction)   . GERD (gastroesophageal reflux disease)   . History of acute pyelonephritis Aug. 2014   hospitalized; 100k E. coli pansensitive  . Hypercholesteremia   . Hyperlipidemia   . Hypertension   . Hypertensive heart disease   . Inguinal hernia   . Sciatica   . Syncope and collapse   . Tobacco use   . Unspecified systolic (congestive) heart failure [facility])     Patient Active Problem List   Diagnosis Date Noted  . Onychomycosis of left great toe 01/03/2019  . Pain and swelling of left lower extremity  01/03/2019  . Sciatica 10/27/2017  . Rectal bleeding 05/07/2016  . Internal hemorrhoids 05/07/2016  . Hyperkalemia 12/29/2015  . Medication monitoring encounter 10/25/2015  . Hyperlipidemia   . GERD (gastroesophageal reflux disease)   . Hypertensive heart disease   . Unspecified systolic (congestive) heart failure ([place])   . ED (erectile dysfunction)   . Diabetic neuropathy ([place])   . S/P coronary artery stent placement 09/25/2013  . CAD (coronary artery disease) 07/28/2013  . Diabetes mellitus type II, controlled ([place]) 07/28/2013  . Abnormal EKG 07/24/2013  . Preoperative cardiovascular examination 07/24/2013  . Smoker 07/24/2013  . Gallstone 07/17/2013  . Inguinal hernia 07/17/2013    Past Surgical History:  Procedure Laterality Date  . CARDIAC CATHETERIZATION  11/14   [facility] x1  . [facility]  [facility]  . COLONOSCOPY WITH PROPOFOL N/A 06/22/2016   Procedure: COLONOSCOPY WITH PROPOFOL;  Surgeon: Robert Bellow, MD;  Location[facility] ENDOSCOPY;  Service: Endoscopy;  Laterality: N/A;  . ELBOW ARTHROSCOPY  2001   lt  . GUM SURGERY    . OLECRANON BURSECTOMY  11/12/2011   Procedure: OLECRANON BURSA;  Surgeon: Linna Hoff;  Location: [place];  Service: Orthopedics;  Laterality: Right;  olecracnon bursectomy with delayed closure  . WISDOM TOOTH EXTRACTION      Prior to Admission medications   Medication Sig Start Date End Date Taking? Authorizing Provider  amoxicillin (AMOXIL) 875 MG tablet Take 875 mg by mouth 2 (two) times daily.    [provider]  aspirin 81 MG tablet Take 81 mg by mouth daily. Take 1 tablet (81 mg) by mouth once daily    [provider]  atorvastatin (LIPITOR) 20 MG tablet Take 1 tablet (20 mg total) by mouth daily. 12/19/18 03/19/19  Minna Merritts, MD  CINNAMON PO Take 1,000 mg by mouth 2 (two) times daily. Reported on 05/06/2016    [provider]  finasteride (PROSCAR) 5 MG tablet Take 1 tablet (5 mg total)  by mouth daily. 08/26/15   Minna Merritts, MD  gabapentin (NEURONTIN) 300 MG capsule TAKE 1 CAPSULE BY MOUTH EVERYDAY AT BEDTIME 11/21/18   Lada, Satira Anis, MD  lisinopril (PRINIVIL,ZESTRIL) 30 MG tablet Take 1 tablet (30 mg total) by mouth daily. 11/11/18   Arnetha Courser, MD  metFORMIN (GLUCOPHAGE) 500 MG tablet Take 1 tablet (500 mg total) by mouth 2 (two) times daily with a meal. 11/16/18   Lada, Satira Anis, MD  nitroGLYCERIN (NITROSTAT) 0.4 MG SL tablet Place 1 tablet (0.4 mg total) under the tongue every 5 (five) minutes as needed. 12/19/18   Minna Merritts, MD  pantoprazole (PROTONIX) 40 MG tablet TAKE 1 TABLET BY MOUTH EVERY DAY 11/15/18   Minna Merritts, MD  pyridOXINE (VITAMIN B-6) 100 MG tablet Take 100 mg by mouth daily. Reported on 05/06/2016    [provider]  tadalafil (CIALIS) 5 MG tablet Take 5 mg by mouth daily. Reported on 05/06/2016    [provider]    Allergies Patient has no known allergies.  Family History  Problem Relation Age of Onset  . Hypertension Father   . Diabetes Father   . Heart disease Father   . Diabetes Mother   . Heart disease Mother   . Hypertension Mother   . Cancer Mother        bladder  . Diabetes Sister   . Stroke Sister        mini stroke  . Diabetes Brother   . Diabetes Sister   . Diabetes Sister   . Diabetes Sister   . Diabetes Sister     Social History Social History   Tobacco Use  . Smoking status: Current Every Day Smoker    Packs/day: 0.50    Years: 40.00    Pack years: 20.00    Types: Cigarettes    Last attempt to quit: 11/09/2012    Years since quitting: 6.1  . Smokeless tobacco: Never Used  Substance Use Topics  . Alcohol use: Yes    Alcohol/week: 0.0 standard drinks    Comment: occasional  . Drug use: No    Review of Systems Constitutional: Low-grade fever and chills.  No fatigue. Eyes: No visual changes. ENT: No sore throat. Cardiovascular: Denies chest pain. Respiratory: Denies shortness  of breath. Gastrointestinal: No abdominal pain.   Genitourinary: Negative for dysuria. Musculoskeletal: Negative for back pain.  See HPI Skin: Negative for rash except see HPI regarding left great toe. Neurological: Negative for headaches, areas of focal weakness or numbness.    ____________________________________________   PHYSICAL EXAM:  VITAL SIGNS: ED Triage Vitals  Enc Vitals Group     BP 01/03/19 1349 (!) 159/69     Pulse Rate 01/03/19 1349 66     Resp 01/03/19 1349 18     Temp 01/03/19 1349 97.9 F (36.6 C)     Temp Source 01/03/19  1349 Oral     SpO2 01/03/19 1349 98 %     Weight 01/03/19 1350 165 lb 1.6 oz (74.9 kg)     Height 01/03/19 1350 6' (1.829 m)     Head Circumference --      Peak Flow --      Pain Score 01/03/19 1349 1     Pain Loc --      Pain Edu? --      Excl. in [place]? --     Constitutional: Alert and oriented. Well appearing and in no acute distress.  Both the patient and his wife are very pleasant. Eyes: Conjunctivae are normal. Head: Atraumatic. Nose: No congestion/rhinnorhea. Mouth/Throat: Mucous membranes are moist. Neck: No stridor.  Cardiovascular: Normal rate, regular rhythm. Grossly normal heart sounds.  Good peripheral circulation. Respiratory: Normal respiratory effort.  No retractions. Lungs CTAB. Gastrointestinal: Soft and nontender. No distention. Musculoskeletal:   Lower Extremities  No edema. Normal DP/PT pulses bilateral with good cap refill.  Normal neuro-motor function lower extremities bilateral.  RIGHT Right lower extremity demonstrates normal strength, good use of all muscles. No edema bruising or contusions of the right hip, right knee, right ankle. Full range of motion of the right lower extremity without pain. No pain on axial loading. No evidence of trauma.  LEFT Left lower extremity demonstrates normal strength, good use of all muscles. No edema bruising or contusions of the hip,  knee, ankle. Full range of motion of  the left lower extremity without pain. No pain on axial loading. No evidence of trauma.  His left great toe however does demonstrate an area of what appears to be chronic inflammatory change, possibly some onychomycosis associated.  Does have a slight amount of necrotic appearing tissue dry on the edges at the very distal tip.  Nailbed appears chronically deformed.  It is slightly warm to the touch and slightly circumferentially edematous at the tip of the left great toe.  Please see uploaded clinical media   Neurologic:  Normal speech and language. No gross focal neurologic deficits are appreciated.  Skin:  Skin is warm, dry and intact. No rash noted. Psychiatric: Mood and affect are normal. Speech and behavior are normal.  ____________________________________________   LABS (all labs ordered are listed, but only abnormal results are displayed)  Labs Reviewed  CULTURE, BLOOD (ROUTINE X 2)  CULTURE, BLOOD (ROUTINE X 2)   ____________________________________________  EKG   ____________________________________________  RADIOLOGY   ____________________________________________   PROCEDURES  Procedure(s) performed: None  Procedures  Critical Care performed: No  ____________________________________________   INITIAL IMPRESSION / ASSESSMENT AND PLAN / ED COURSE  Pertinent labs & imaging results that were available during my care of the patient were reviewed by me and considered in my medical decision making (see chart for details).   Evaluation clinical history as well as imaging studies from the clinic seem to be supportive of a diagnosis of osteomyelitis.  Will update tetanus as he is not sure the last time he had.  He is in no pain.  His distal pulses are strong and his capillary refill is good.  He does have neuropathy involving left lower foot.  Shows no signs or symptoms of septicemia at this time.  I have placed a consultation to Dr. Caryl Comes of podiatry    Per signed PCP  chart: "Addendum: Paged Dr. Cleda Mccreedy [facility] Podiatrist on-call at 12:10pm.  Xray shows Advanced destructive changes of the distal phalanx of the great toe with associated fragmentation, presumed  osteomyelitis. Associated soft tissue swelling/edema. Labs show elevated Sed rate and D-dimer.  D-dimer likely secondary to inflammation, as LLE [place] was negative for DVT.  Dr. Cleda Mccreedy advises the patient to go to the ER for evaluation and consult.  Likely surgical intervention.  I spoke with the patient and his wife at 12:15pm and discussed the results along with recommendation for ER.  They will drive to [facility] ER via POV.  I personally called [facility] ER at 12:25 to advise of patient's impending arrival."  Spoke with podiatry, Dr. Caryl Comes will see the patient in consultation today.  Requested admission to the hospital service.  Antibiotics discussed with Dr. Caryl Comes, vancomycin and cephalosporin selected.  Patient understanding of plan.  ____________________________________________   FINAL CLINICAL IMPRESSION(S) / ED DIAGNOSES  Final diagnoses:  Osteomyelitis of great toe [facility])        Note:  This document was prepared using Dragon voice recognition software and may include unintentional dictation errors       Delman Kitten, MD 01/03/19 1604

## 2019-01-03 NOTE — Consult Note (Signed)
Pharmacy Antibiotic Note  Brian Dean is a 63 y.o. male admitted on 01/03/2019 with wound/suspected osteomyelitis.  Pharmacy has been consulted for Zosyn/Vancomycin dosing.  Plan: Zosyn extended infusion 3.375mg  q 8 hours  Loading dose of Vancomycin 1500mg , followed by Vancomycin 1000 mg IV Q 12 hrs. Goal AUC 400-550. Expected AUC: 476 SCr used: 0.95   Height: 6' (182.9 cm) Weight: 165 lb 1.6 oz (74.9 kg) IBW/kg (Calculated) : 77.6  Temp (24hrs), Avg:97.9 F (36.6 C), Min:97.9 F (36.6 C), Max:97.9 F (36.6 C)  Recent Labs  Lab 01/03/19 0851  WBC 8.8  CREATININE 0.95    Estimated Creatinine Clearance: 85.4 mL/min (by C-G formula based on SCr of 0.95 mg/dL).    No Known Allergies  Antimicrobials this admission: Zosyn 2/25 >> pending Vancomycin 2/25 >> pending  Dose adjustments this admission: None (original consult was for Vanc/Cefepime - but new consult for Vanc/Zosyn entered prior to admin of any Cefepime dosing)  Microbiology results: 2/25 BCx: pending  Thank you for allowing pharmacy to be a part of this patient's care.  Lu Duffel, PharmD, BCPS Clinical Pharmacist 01/03/2019 4:34 PM

## 2019-01-03 NOTE — Progress Notes (Addendum)
Name: Brian Dean   MRN: [id number]    DOB: 06/12/1956   Date:01/03/2019       Progress Note  Subjective  Chief Complaint  Chief Complaint  Patient presents with  . Ankle Pain    left ankle pain, swelling, tender    HPI  Pt presents with LEFT foot pain, tenderness, and swelling for about a month.  He denies any known injury to the site, no history of similar issues.  No recent travel or immobilization, he is a smoker with CAD and has DM (controlled) with neuropathy.   He saw his cardiologist (Dr. Rockey Dean) on 12/19/2018 and was told he had adequate pulses.  The swelling is to the foot and ankle, but does involved the calf/shin later on in the day.  No chest pain, no shortness of breath, fevers or chills. He notes that it is early on in the day right now, and by the end of the day the swelling is even worse.   Patient Active Problem List   Diagnosis Date Noted  . Onychomycosis of left great toe 01/03/2019  . Pain and swelling of left lower extremity 01/03/2019  . Sciatica 10/27/2017  . Rectal bleeding 05/07/2016  . Internal hemorrhoids 05/07/2016  . Hyperkalemia 12/29/2015  . Medication monitoring encounter 10/25/2015  . Hyperlipidemia   . GERD (gastroesophageal reflux disease)   . Hypertensive heart disease   . Unspecified systolic (congestive) heart failure ([place])   . ED (erectile dysfunction)   . Diabetic neuropathy ([place])   . S/P coronary artery stent placement 09/25/2013  . CAD (coronary artery disease) 07/28/2013  . Diabetes mellitus type II, controlled ([place]) 07/28/2013  . Abnormal EKG 07/24/2013  . Preoperative cardiovascular examination 07/24/2013  . Smoker 07/24/2013  . Gallstone 07/17/2013  . Inguinal hernia 07/17/2013    Social History   Tobacco Use  . Smoking status: Current Every Day Smoker    Packs/day: 0.50    Years: 40.00    Pack years: 20.00    Types: Cigarettes    Last attempt to quit: 11/09/2012    Years since quitting: 6.1  . Smokeless tobacco:  Never Used  Substance Use Topics  . Alcohol use: Yes    Alcohol/week: 0.0 standard drinks    Comment: occasional     Current Outpatient Medications:  .  aspirin 81 MG tablet, Take 81 mg by mouth daily. Take 1 tablet (81 mg) by mouth once daily, Disp: , Rfl:  .  atorvastatin (LIPITOR) 20 MG tablet, Take 1 tablet (20 mg total) by mouth daily., Disp: 90 tablet, Rfl: 3 .  CINNAMON PO, Take 1,000 mg by mouth 2 (two) times daily. Reported on 05/06/2016, Disp: , Rfl:  .  finasteride (PROSCAR) 5 MG tablet, Take 1 tablet (5 mg total) by mouth daily., Disp: 90 tablet, Rfl: 3 .  gabapentin (NEURONTIN) 300 MG capsule, TAKE 1 CAPSULE BY MOUTH EVERYDAY AT BEDTIME, Disp: 90 capsule, Rfl: 3 .  lisinopril (PRINIVIL,ZESTRIL) 30 MG tablet, Take 1 tablet (30 mg total) by mouth daily., Disp: 90 tablet, Rfl: 3 .  metFORMIN (GLUCOPHAGE) 500 MG tablet, Take 1 tablet (500 mg total) by mouth 2 (two) times daily with a meal., Disp: 180 tablet, Rfl: 1 .  nitroGLYCERIN (NITROSTAT) 0.4 MG SL tablet, Place 1 tablet (0.4 mg total) under the tongue every 5 (five) minutes as needed., Disp: 25 tablet, Rfl: 3 .  pantoprazole (PROTONIX) 40 MG tablet, TAKE 1 TABLET BY MOUTH EVERY DAY, Disp: 90 tablet, Rfl: 0 .  pyridOXINE (VITAMIN B-6) 100 MG tablet, Take 100 mg by mouth daily. Reported on 05/06/2016, Disp: , Rfl:  .  tadalafil (CIALIS) 5 MG tablet, Take 5 mg by mouth daily. Reported on 05/06/2016, Disp: , Rfl:  .  amoxicillin (AMOXIL) 875 MG tablet, Take 875 mg by mouth 2 (two) times daily., Disp: , Rfl:   No Known Allergies  I personally reviewed active problem list, medication list, allergies, notes from last encounter with the patient/caregiver today.  ROS  Ten systems reviewed and is negative except as mentioned in HPI.  Objective  Vitals:   01/03/19 0725  BP: 130/84  Pulse: 73  Resp: 18  Temp: 97.9 F (36.6 C)  TempSrc: Oral  SpO2: 97%  Weight: 165 lb 1.6 oz (74.9 kg)  Height: 6' (1.829 m)    Body mass  index is 22.39 kg/m.  Nursing Note and Vital Signs reviewed.  Physical Exam Vitals signs and nursing note reviewed.  Constitutional:      General: He is not in acute distress.    Appearance: He is well-developed.  HENT:     Head: Normocephalic and atraumatic.     Right Ear: External ear normal.     Left Ear: External ear normal.     Nose: Nose normal.     Mouth/Throat:     Pharynx: No oropharyngeal exudate.  Eyes:     Conjunctiva/sclera: Conjunctivae normal.     Pupils: Pupils are equal, round, and reactive to light.  Neck:     Musculoskeletal: Normal range of motion and neck supple.     Vascular: No JVD.  Cardiovascular:     Rate and Rhythm: Normal rate and regular rhythm.     Heart sounds: Normal heart sounds.  Pulmonary:     Effort: Pulmonary effort is normal.     Breath sounds: Normal breath sounds.  Musculoskeletal: Normal range of motion.     Left lower leg: He exhibits tenderness and swelling. He exhibits no bony tenderness.     Left foot: Normal range of motion. Deformity present.     Comments: Circumferential non-pitting edema from the great toe, entire foot, up to a few inches above the ankle.  The area is warm to the touch, but there is not visible erythema.  Veins are distended to the LLE. Pedal pulse +2.  Feet:     Right foot:     Skin integrity: No ulcer or dry skin.     Toenail Condition: Right toenails are abnormally thick. Fungal disease present.    Left foot:     Skin integrity: No ulcer or dry skin.     Toenail Condition: Left toenails are abnormally thick. Fungal disease present.    Comments: LEFT great toe has onychomycosis, and the medial distal aspect shows small wound that pt reports has been present since August 2019. Skin:    General: Skin is warm and dry.     Capillary Refill: Capillary refill takes less than 2 seconds.     Findings: No rash.  Neurological:     Mental Status: He is alert and oriented to person, place, and time.     Cranial  Nerves: No cranial nerve deficit.  Psychiatric:        Behavior: Behavior normal.        Thought Content: Thought content normal.        Judgment: Judgment normal.     No results found for this or any previous visit (from the past 72 hour(s)).  Assessment & Plan  1. Pain and swelling of left lower extremity - DG Foot Complete Left; Future - DG Ankle Complete Left; Future - US Venous Img Lower Unilateral Left; Future - Comprehensive metabolic panel; Future - CBC with Differential/Platelet; Future - Sedimentation rate; Future - C-reactive protein; Future - Uric acid; Future - D-Dimer, Quantitative; Future 2. Onychomycosis of left great toe - DG Foot Complete Left; Future - DG Ankle Complete Left; Future 3. Coronary artery disease involving native coronary artery of native heart without angina pectoris 4. Diabetic polyneuropathy associated with type 2 diabetes mellitus ([place]) 5. Controlled type 2 diabetes mellitus with other skin complication, without long-term current use of insulin (HCC) - Discussed increased risk for osteo, vasculitis, cellulitis, DVT, and other vascular complications.  -Red flags and when to present for emergency care or RTC including fever >101.39F, chest pain, shortness of breath, new/worsening/un-resolving symptoms, reviewed with patient at time of visit. Follow up and care instructions discussed and provided in AVS.  - Discussed case with PCP Dr. Sanda Klein in detail.  We will perform the above imaging and laboratory testing.  Advised that he should not fly to [place] today as he had planned, and that we will complete any needed documentation for him to obtain a refund under medical necessity.  He will go directly to [facility] for labs and then to [facility] for imaging.   Addendum: Paged Dr. Cleda Mccreedy [facility] Podiatrist on-call at 12:10pm.  Xray shows Advanced destructive changes of the distal phalanx of the great toe with associated fragmentation, presumed  osteomyelitis. Associated soft tissue swelling/edema. Labs show elevated Sed rate and D-dimer.  D-dimer likely secondary to inflammation, as LLE [place] was negative for DVT.  Dr. Cleda Mccreedy advises the patient to go to the ER for evaluation and consult.  Likely surgical intervention.  I spoke with the patient and his wife at 12:15pm and discussed the results along with recommendation for ER.  They will drive to [facility] ER via POV.  I personally called [facility] at 12:25 to advise of patient's impending arrival.

## 2019-01-03 NOTE — H&P (Signed)
[facility] NAME: Brian Dean    MR#:  [id number]  DATE OF BIRTH:  Sep 07, 1956  DATE OF ADMISSION:  01/03/2019  PRIMARY CARE PHYSICIAN: Arnetha Courser, MD   REQUESTING/REFERRING PHYSICIAN: Dr. Delman Kitten.   CHIEF COMPLAINT:   Chief Complaint  Patient presents with  . Toe Pain    HISTORY OF PRESENT ILLNESS:  Brian Dean  is a 64 y.o. male with a known history of coronary artery disease, diabetes, diabetic neuropathy, hypertension, hyperlipidemia, tobacco abuse, GERD who presents to the hospital due to left ankle/foot swelling.  Patient says he has been having swelling in his left foot ankle over the past week or so and gets worse as the day progresses.  He denies any associated fever, chills, pain, nausea or vomiting.  He was supposed to go out of town and fly to [place] today but since his symptoms were not improving he went to see his primary care physician who then referred him to the ER for further evaluation.  Patient underwent a x-ray of his left foot which was suggestive of osteomyelitis of the distal phalanx of the left great toe.  Hospitalist services were contacted for admission.  Patient denies any chest pains, shortness of breath, nausea, vomiting, fever, chills, cough congestion or any other associated symptoms presently.  PAST MEDICAL HISTORY:   Past Medical History:  Diagnosis Date  . Arthritis   . CAD (coronary artery disease) Nov 2014   diffuse, aggresive risk factor modification recommended  . Diabetes mellitus without complication ([place])   . Diabetic neuropathy ([place])   . Dyslipidemia   . ED (erectile dysfunction)   . GERD (gastroesophageal reflux disease)   . History of acute pyelonephritis Aug. 2014   hospitalized; 100k E. coli pansensitive  . Hypercholesteremia   . Hyperlipidemia   . Hypertension   . Hypertensive heart disease   . Inguinal hernia   . Sciatica   . Syncope and collapse   . Tobacco use     . Unspecified systolic (congestive) heart failure ([place])     PAST SURGICAL HISTORY:   Past Surgical History:  Procedure Laterality Date  . CARDIAC CATHETERIZATION  11/14   [facility] x1  . COLONOSCOPY  2004   [facility]  . COLONOSCOPY WITH PROPOFOL N/A 06/22/2016   Procedure: COLONOSCOPY WITH PROPOFOL;  Surgeon: Robert Bellow, MD;  [facility] ENDOSCOPY;  Service: Endoscopy;  Laterality: N/A;  . ELBOW ARTHROSCOPY  2001   lt  . GUM SURGERY    . OLECRANON BURSECTOMY  11/12/2011   Procedure: OLECRANON BURSA;  Surgeon: Linna Hoff;  Location: [facility];  Service: Orthopedics;  Laterality: Right;  olecracnon bursectomy with delayed closure  . WISDOM TOOTH EXTRACTION      SOCIAL HISTORY:   Social History   Tobacco Use  . Smoking status: Current Every Day Smoker    Packs/day: 0.50    Years: 47.00    Pack years: 23.50    Types: Cigarettes    Last attempt to quit: 11/09/2012    Years since quitting: 6.1  . Smokeless tobacco: Never Used  Substance Use Topics  . Alcohol use: Yes    Alcohol/week: 0.0 standard drinks    Comment: occasional    FAMILY HISTORY:   Family History  Problem Relation Age of Onset  . Hypertension Father   . Diabetes Father   . Heart disease Father   . Diabetes Mother   .  Heart disease Mother   . Hypertension Mother   . Cancer Mother        bladder  . Diabetes Sister   . Stroke Sister        mini stroke  . Diabetes Brother   . Diabetes Sister   . Diabetes Sister   . Diabetes Sister   . Diabetes Sister     DRUG ALLERGIES:  No Known Allergies  REVIEW OF SYSTEMS:   Review of Systems  Constitutional: Negative for fever and weight loss.  HENT: Negative for congestion, nosebleeds and tinnitus.   Eyes: Negative for blurred vision, double vision and redness.  Respiratory: Negative for cough, hemoptysis and shortness of breath.   Cardiovascular: Negative for chest pain, orthopnea, leg swelling and PND.  Gastrointestinal: Negative for  abdominal pain, diarrhea, melena, nausea and vomiting.  Genitourinary: Negative for dysuria, hematuria and urgency.  Musculoskeletal: Positive for joint pain (Left foot/ankle). Negative for falls.  Neurological: Negative for dizziness, tingling, sensory change, focal weakness, seizures, weakness and headaches.  Endo/Heme/Allergies: Negative for polydipsia. Does not bruise/bleed easily.  Psychiatric/Behavioral: Negative for depression and memory loss. The patient is not nervous/anxious.     MEDICATIONS AT HOME:   Prior to Admission medications   Medication Sig Start Date End Date Taking? Authorizing Provider  amoxicillin (AMOXIL) 875 MG tablet Take 875 mg by mouth 2 (two) times daily.    [provider]  aspirin 81 MG tablet Take 81 mg by mouth daily. Take 1 tablet (81 mg) by mouth once daily    [provider]  atorvastatin (LIPITOR) 20 MG tablet Take 1 tablet (20 mg total) by mouth daily. 12/19/18 03/19/19  Minna Merritts, MD  CINNAMON PO Take 1,000 mg by mouth 2 (two) times daily. Reported on 05/06/2016    [provider]  finasteride (PROSCAR) 5 MG tablet Take 1 tablet (5 mg total) by mouth daily. 08/26/15   Minna Merritts, MD  gabapentin (NEURONTIN) 300 MG capsule TAKE 1 CAPSULE BY MOUTH EVERYDAY AT BEDTIME 11/21/18   Lada, Satira Anis, MD  lisinopril (PRINIVIL,ZESTRIL) 30 MG tablet Take 1 tablet (30 mg total) by mouth daily. 11/11/18   Arnetha Courser, MD  metFORMIN (GLUCOPHAGE) 500 MG tablet Take 1 tablet (500 mg total) by mouth 2 (two) times daily with a meal. 11/16/18   Lada, Satira Anis, MD  nitroGLYCERIN (NITROSTAT) 0.4 MG SL tablet Place 1 tablet (0.4 mg total) under the tongue every 5 (five) minutes as needed. 12/19/18   Minna Merritts, MD  pantoprazole (PROTONIX) 40 MG tablet TAKE 1 TABLET BY MOUTH EVERY DAY 11/15/18   Minna Merritts, MD  pyridOXINE (VITAMIN B-6) 100 MG tablet Take 100 mg by mouth daily. Reported on 05/06/2016    [provider]    tadalafil (CIALIS) 5 MG tablet Take 5 mg by mouth daily. Reported on 05/06/2016    [provider]      VITAL SIGNS:  Blood pressure (!) 159/69, pulse 66, temperature 97.9 F (36.6 C), temperature source Oral, resp. rate 18, height 6' (1.829 m), weight 74.9 kg, SpO2 98 %.  PHYSICAL EXAMINATION:  Physical Exam  GENERAL:  63 y.o.-year-old patient lying in the bed with no acute distress.  EYES: Pupils equal, round, reactive to light and accommodation. No scleral icterus. Extraocular muscles intact.  HEENT: Head atraumatic, normocephalic. Oropharynx and nasopharynx clear. No oropharyngeal erythema, moist oral mucosa  NECK:  Supple, no jugular venous distention. No thyroid enlargement, no tenderness.  LUNGS:  Normal breath sounds bilaterally, no wheezing, rales, rhonchi. No use of accessory muscles of respiration.  CARDIOVASCULAR: S1, S2 RRR. No murmurs, rubs, gallops, clicks.  ABDOMEN: Soft, nontender, nondistended. Bowel sounds present. No organomegaly or mass.  EXTREMITIES: No pedal edema, cyanosis, or clubbing. + 2 pedal & radial pulses b/l.  Left great toe lesion as noted below. Left Ankle swelling.     NEUROLOGIC: Cranial nerves II through XII are intact. No focal Motor or sensory deficits appreciated b/l PSYCHIATRIC: The patient is alert and oriented x 3.  SKIN: No obvious rash, lesion, or ulcer.   LABORATORY PANEL:   CBC Recent Labs  Lab 01/03/19 0851  WBC 8.8  HGB 11.2*  HCT 33.5*  PLT 220   ------------------------------------------------------------------------------------------------------------------  Chemistries  Recent Labs  Lab 01/03/19 0851  NA 135  K 4.0  CL 103  CO2 25  GLUCOSE 139*  BUN 19  CREATININE 0.95  CALCIUM 8.6*  AST 22  ALT 19  ALKPHOS 48  BILITOT 1.2   ------------------------------------------------------------------------------------------------------------------  Cardiac Enzymes No results for input(s): TROPONINI in the  last 168 hours. ------------------------------------------------------------------------------------------------------------------  RADIOLOGY:  Dg Ankle Complete Left  Result Date: 01/03/2019 CLINICAL DATA:  Swelling. Diabetic. EXAM: LEFT ANKLE COMPLETE - 3+ VIEW COMPARISON:  None. FINDINGS: Osseous structures of the LEFT ankle appear intact and normally aligned. Bone mineralization is normal. No degenerative change. Visualized structures of the hindfoot and midfoot appear intact and normally aligned. Adjacent soft tissues are unremarkable. IMPRESSION: Negative LEFT ankle. Electronically Signed   By: Franki Cabot M.D.   On: 01/03/2019 11:38   US Venous Img Lower Unilateral Left  Result Date: 01/03/2019 CLINICAL DATA:  Left lower extremity pain and edema. EXAM: LEFT LOWER EXTREMITY VENOUS DOPPLER ULTRASOUND TECHNIQUE: Gray-scale sonography with graded compression, as well as color Doppler and duplex ultrasound were performed to evaluate the lower extremity deep venous systems from the level of the common femoral vein and including the common femoral, femoral, profunda femoral, popliteal and calf veins including the posterior tibial, peroneal and gastrocnemius veins when visible. The superficial great saphenous vein was also interrogated. Spectral Doppler was utilized to evaluate flow at rest and with distal augmentation maneuvers in the common femoral, femoral and popliteal veins. COMPARISON:  None. FINDINGS: Contralateral Common Femoral Vein: Respiratory phasicity is normal and symmetric with the symptomatic side. No evidence of thrombus. Normal compressibility. Common Femoral Vein: No evidence of thrombus. Normal compressibility, respiratory phasicity and response to augmentation. Saphenofemoral Junction: No evidence of thrombus. Normal compressibility and flow on color Doppler imaging. Profunda Femoral Vein: No evidence of thrombus. Normal compressibility and flow on color Doppler imaging. Femoral Vein:  No evidence of thrombus. Normal compressibility, respiratory phasicity and response to augmentation. Popliteal Vein: No evidence of thrombus. Normal compressibility, respiratory phasicity and response to augmentation. Calf Veins: No evidence of thrombus. Normal compressibility and flow on color Doppler imaging. Superficial Great Saphenous Vein: No evidence of thrombus. Normal compressibility. Venous Reflux:  None. Other Findings: No evidence of superficial thrombophlebitis or abnormal fluid collection. Single mildly prominent left inguinal lymph node measures 1.5 cm in short axis. This may be a reactive node. IMPRESSION: No evidence of left lower extremity deep venous thrombosis. Electronically Signed   By: Aletta Edouard M.D.   On: 01/03/2019 12:52   Dg Foot Complete Left  Result Date: 01/03/2019 CLINICAL DATA:  Pt has onychomycosis of great toe nail. History of open sore after pulling part of nail off in August of 2019, with blisters. Now with  soft tissue swelling. EXAM: LEFT FOOT - COMPLETE 3+ VIEW COMPARISON:  None. FINDINGS: There are advanced destructive changes of the distal phalanx of the great toe with associated fragmentation, presumed osteomyelitis. Associated soft tissue swelling/edema. No soft tissue gas seen. Remainder the osseous structures appear intact and normal in mineralization. Mild degenerative osteoarthritis at the first MTP joint. IMPRESSION: Advanced destructive changes of the distal phalanx of the great toe with associated fragmentation, presumed osteomyelitis. Associated soft tissue swelling/edema. Electronically Signed   By: Franki Cabot M.D.   On: 01/03/2019 11:37     IMPRESSION AND PLAN:   63 year old male with past medical history of diabetes, hypertension, diabetic neuropathy, GERD, hyperlipidemia who presented to the hospital due to left great toe lesion with left ankle swelling.  1.  Left foot/distal phalanx of the great toe osteomyelitis- this is a source of patient's  left ankle pain and a lesion noted on the left toe.  Patient is clinically afebrile and hemodynamically stable with a normal white cell count. -Patient's x-ray is suggestive of presumed osteomyelitis.  We will get podiatry consult and discussed the case with Dr. Cleda Mccreedy - We will empirically give the patient IV vancomycin, Zosyn.  Follow cultures.  2.  Essential hypertension-continue lisinopril.  3.  Diabetes type 2 with neuropathy-continue metformin, will place on sliding scale insulin.  4.  Diabetic neuropathy-continue gabapentin.  5.  BPH-no urine retention.  Continue Proscar.  6.  GERD-continue Protonix.  7.  Hyperlipidemia-continue atorvastatin.    All the records are reviewed and case discussed with ED provider. Management plans discussed with the patient, family and they are in agreement.  CODE STATUS: Full code  TOTAL TIME TAKING CARE OF THIS PATIENT: 40 minutes.    Henreitta Leber M.D on 01/03/2019 at 4:23 PM  Between 7am to 6pm - Pager - [phone]  After 6pm go to [email or web address] - password [email or web address] [email or web address]  Office  [phone]  CC: Primary care physician; Arnetha Courser, MD

## 2019-01-03 NOTE — Consult Note (Signed)
Reason for Consult: Osteomyelitis left great toe Referring Physician: Fadi Dean is an 63 y.o. male.  HPI: This is a 63 year old male who relates a history of pulling off a scab or blistered area at the tip of his left great toe at the end of August.  Subsequently had continued problems with a sore and some infection that seem to clear up around October.  Recently had some increased swelling and redness in the left foot.  Presented to his primary care where x-rays were taken that were concerning for osteomyelitis.  Decision was made for admission to the hospital for IV antibiotics and amputation of the left great toe.  Past Medical History:  Diagnosis Date  . Arthritis   . CAD (coronary artery disease) Nov 2014   diffuse, aggresive risk factor modification recommended  . Diabetes mellitus without complication ([place])   . Diabetic neuropathy ([place])   . Dyslipidemia   . ED (erectile dysfunction)   . GERD (gastroesophageal reflux disease)   . History of acute pyelonephritis Aug. 2014   hospitalized; 100k E. coli pansensitive  . Hypercholesteremia   . Hyperlipidemia   . Hypertension   . Hypertensive heart disease   . Inguinal hernia   . Sciatica   . Syncope and collapse   . Tobacco use   . Unspecified systolic (congestive) heart failure [facility])     Past Surgical History:  Procedure Laterality Date  . CARDIAC CATHETERIZATION  11/14   [facility] x1  . [facility]  [facility]  . COLONOSCOPY WITH PROPOFOL N/A 06/22/2016   Procedure: COLONOSCOPY WITH PROPOFOL;  Surgeon: Robert Bellow, MD;  Location: [facility] ENDOSCOPY;  Service: Endoscopy;  Laterality: N/A;  . ELBOW ARTHROSCOPY  2001   lt  . GUM SURGERY    . OLECRANON BURSECTOMY  11/12/2011   Procedure: OLECRANON BURSA;  Surgeon: Linna Hoff;  Location: [place];  Service: Orthopedics;  Laterality: Right;  olecracnon bursectomy with delayed closure  . WISDOM TOOTH EXTRACTION      Family History  Problem  Relation Age of Onset  . Hypertension Father   . Diabetes Father   . Heart disease Father   . Diabetes Mother   . Heart disease Mother   . Hypertension Mother   . Cancer Mother        bladder  . Diabetes Sister   . Stroke Sister        mini stroke  . Diabetes Brother   . Diabetes Sister   . Diabetes Sister   . Diabetes Sister   . Diabetes Sister     Social History:  reports that he has been smoking cigarettes. He has a 23.50 pack-year smoking history. He has never used smokeless tobacco. He reports current alcohol use. He reports that he does not use drugs.  Allergies: No Known Allergies  Medications:  Scheduled: . [START ON 01/04/2019] aspirin EC  81 mg Oral Daily  . atorvastatin  20 mg Oral Daily  . enoxaparin (LOVENOX) injection  40 mg Subcutaneous Q24H  . finasteride  5 mg Oral Daily  . gabapentin  300 mg Oral QHS  . insulin aspart  0-5 Units Subcutaneous QHS  . insulin aspart  0-9 Units Subcutaneous TID WC  . [START ON 01/04/2019] lisinopril  30 mg Oral Daily  . metFORMIN  500 mg Oral BID WC  . [START ON 01/04/2019] pantoprazole  40 mg Oral Daily  . pyridOXINE  100 mg Oral Daily  Results for orders placed or performed during the hospital encounter of 01/03/19 (from the past 48 hour(s))  Glucose, capillary     Status: None   Collection Time: 01/03/19  5:14 PM  Result Value Ref Range   Glucose-Capillary 89 70 - 99 mg/dL   Comment 1 Notify RN     Dg Ankle Complete Left  Result Date: 01/03/2019 CLINICAL DATA:  Swelling. Diabetic. EXAM: LEFT ANKLE COMPLETE - 3+ VIEW COMPARISON:  None. FINDINGS: Osseous structures of the LEFT ankle appear intact and normally aligned. Bone mineralization is normal. No degenerative change. Visualized structures of the hindfoot and midfoot appear intact and normally aligned. Adjacent soft tissues are unremarkable. IMPRESSION: Negative LEFT ankle. Electronically Signed   By: Franki Cabot M.D.   On: 01/03/2019 11:38   US Venous Img Lower  Unilateral Left  Result Date: 01/03/2019 CLINICAL DATA:  Left lower extremity pain and edema. EXAM: LEFT LOWER EXTREMITY VENOUS DOPPLER ULTRASOUND TECHNIQUE: Gray-scale sonography with graded compression, as well as color Doppler and duplex ultrasound were performed to evaluate the lower extremity deep venous systems from the level of the common femoral vein and including the common femoral, femoral, profunda femoral, popliteal and calf veins including the posterior tibial, peroneal and gastrocnemius veins when visible. The superficial great saphenous vein was also interrogated. Spectral Doppler was utilized to evaluate flow at rest and with distal augmentation maneuvers in the common femoral, femoral and popliteal veins. COMPARISON:  None. FINDINGS: Contralateral Common Femoral Vein: Respiratory phasicity is normal and symmetric with the symptomatic side. No evidence of thrombus. Normal compressibility. Common Femoral Vein: No evidence of thrombus. Normal compressibility, respiratory phasicity and response to augmentation. Saphenofemoral Junction: No evidence of thrombus. Normal compressibility and flow on color Doppler imaging. Profunda Femoral Vein: No evidence of thrombus. Normal compressibility and flow on color Doppler imaging. Femoral Vein: No evidence of thrombus. Normal compressibility, respiratory phasicity and response to augmentation. Popliteal Vein: No evidence of thrombus. Normal compressibility, respiratory phasicity and response to augmentation. Calf Veins: No evidence of thrombus. Normal compressibility and flow on color Doppler imaging. Superficial Great Saphenous Vein: No evidence of thrombus. Normal compressibility. Venous Reflux:  None. Other Findings: No evidence of superficial thrombophlebitis or abnormal fluid collection. Single mildly prominent left inguinal lymph node measures 1.5 cm in short axis. This may be a reactive node. IMPRESSION: No evidence of left lower extremity deep venous  thrombosis. Electronically Signed   By: Aletta Edouard M.D.   On: 01/03/2019 12:52   Dg Foot Complete Left  Result Date: 01/03/2019 CLINICAL DATA:  Pt has onychomycosis of great toe nail. History of open sore after pulling part of nail off in August of 2019, with blisters. Now with soft tissue swelling. EXAM: LEFT FOOT - COMPLETE 3+ VIEW COMPARISON:  None. FINDINGS: There are advanced destructive changes of the distal phalanx of the great toe with associated fragmentation, presumed osteomyelitis. Associated soft tissue swelling/edema. No soft tissue gas seen. Remainder the osseous structures appear intact and normal in mineralization. Mild degenerative osteoarthritis at the first MTP joint. IMPRESSION: Advanced destructive changes of the distal phalanx of the great toe with associated fragmentation, presumed osteomyelitis. Associated soft tissue swelling/edema. Electronically Signed   By: Franki Cabot M.D.   On: 01/03/2019 11:37    Review of Systems  Constitutional: Negative for chills and fever.  HENT: Negative for congestion and hearing loss.   Eyes: Negative for blurred vision and double vision.  Respiratory: Negative for cough and shortness of breath.  Cardiovascular: Negative for chest pain and palpitations.  Gastrointestinal: Negative for nausea and vomiting.  Genitourinary: Negative for dysuria and frequency.  Musculoskeletal:       Patient relates some pain in his ankle  Skin:       Some swelling in the left foot and ankle.  Denies any recent drainage from the ulcerative area  Neurological:       Does relate some numbness and paresthesias from his diabetes  Endo/Heme/Allergies: Does not bruise/bleed easily.  Psychiatric/Behavioral: Negative for depression and memory loss.   Blood pressure (!) 146/89, pulse 68, temperature 98.4 F (36.9 C), temperature source Oral, resp. rate 17, height 6' (1.829 m), weight 74.9 kg, SpO2 97 %. Physical Exam  Cardiovascular:  DP and PT pulses are  fully palpable bilateral.  Musculoskeletal:     Comments: Adequate range of motion of the pedal joints with exception of some limited motion of the first MTPJ on the left.  Some lateral deviation of the distal hallux on the left.  Muscle testing is deferred.  Neurological:  Loss of protective threshold with a monofilament wire distally in the forefoot and toes bilateral.  Proprioception impaired  Skin:  The skin is warm dry and supple.  Some diffuse edema in the left hallux.  Scabbed over lesion distally with no acute fluid collections or drainage at this point.  No significant cellulitis.    Assessment/Plan: Assessment: 1.  Osteomyelitis left hallux. 2.  Diabetes with associated neuropathy.  Plan: Discussed with the patient that his x-ray findings are consistent with osteomyelitis in his great toe.  Even though clinically it does not appear that bad recommended that we consider amputation of the distal aspect of the great toe which he consents to.  We discussed possible risks and complications of the procedure and anesthesia including inability of the toe to heal due to his diabetes or potential infection.  No guarantees could be given.  Elects to proceed with surgery for partial amputation of the left great toe tomorrow.  N.p.o. after breakfast.  We will have an early tray with nothing to eat after 8.  Consent form for partial amputation left great toe.  Plan for surgery tomorrow evening after work  Brian Dean 01/03/2019, 6:39 PM

## 2019-01-03 NOTE — ED Notes (Signed)
ED TO INPATIENT HANDOFF REPORT  ED Nurse Name and Phone #: Lachlyn Vanderstelt [phone]  S Name/Age/Gender Brian Dean 63 y.o. male Room/Bed: ED30A/ED30A  Code Status   Code Status: Not on file  Home/SNF/Other Home Patient oriented to: self, place, time and situation Is this baseline? Yes   Triage Complete: Triage complete  Chief Complaint ankle pain;toe pain  Triage Note Pt presents to ED via POV with c/o poss osteomyelitis per PCP. Pt had labs done earlier today. Pt c/o swelling to L ankle, pain and swelling to L great toe.    Allergies No Known Allergies  Level of Care/Admitting Diagnosis ED Disposition    ED Disposition Condition [facility] Area: [place] [[id number]]  Level of Care: Med-Surg [16]  Diagnosis: Osteomyelitis of left foot [facility]) [[id number]]  Admitting Physician: Henreitta Leber [[id number]]  Attending Physician: Henreitta Leber [[id number]]  Estimated length of stay: past midnight tomorrow  Certification:: I certify this patient will need inpatient services for at least 2 midnights  PT Class (Do Not Modify): Inpatient [101]  PT Acc Code (Do Not Modify): Private [1]       B Medical/Surgery History Past Medical History:  Diagnosis Date  . Arthritis   . CAD (coronary artery disease) Nov 2014   diffuse, aggresive risk factor modification recommended  . Diabetes mellitus without complication ([place])   . Diabetic neuropathy ([place])   . Dyslipidemia   . ED (erectile dysfunction)   . GERD (gastroesophageal reflux disease)   . History of acute pyelonephritis Aug. 2014   hospitalized; 100k E. coli pansensitive  . Hypercholesteremia   . Hyperlipidemia   . Hypertension   . Hypertensive heart disease   . Inguinal hernia   . Sciatica   . Syncope and collapse   . Tobacco use   . Unspecified systolic (congestive) heart failure [facility])    Past Surgical History:  Procedure Laterality Date  . CARDIAC CATHETERIZATION  11/14   [facility] x1  .  [facility]  [facility]  . COLONOSCOPY WITH PROPOFOL N/A 06/22/2016   Procedure: COLONOSCOPY WITH PROPOFOL;  Surgeon: Robert Bellow, MD;  Location: [facility] ENDOSCOPY;  Service: Endoscopy;  Laterality: N/A;  . ELBOW ARTHROSCOPY  2001   lt  . GUM SURGERY    . OLECRANON BURSECTOMY  11/12/2011   Procedure: OLECRANON BURSA;  Surgeon: Linna Hoff;  Location: [place];  Service: Orthopedics;  Laterality: Right;  olecracnon bursectomy with delayed closure  . WISDOM TOOTH EXTRACTION       A IV Location/Drains/Wounds Patient Lines/Drains/Airways Status   Active Line/Drains/Airways    Name:   Placement date:   Placement time:   Site:   Days:   Peripheral IV 01/03/19 Right Forearm   01/03/19    1407    Forearm   less than 1          Intake/Output Last 24 hours No intake or output data in the 24 hours ending 01/03/19 1639  Labs/Imaging Results for orders placed or performed during the hospital encounter of 01/03/19 (from the past 48 hour(s))  CBC with Differential/Platelet     Status: Abnormal   Collection Time: 01/03/19  8:51 AM  Result Value Ref Range   WBC 8.8 4.0 - 10.5 K/uL   RBC 3.72 (L) 4.22 - 5.81 MIL/uL   Hemoglobin 11.2 (L) 13.0 - 17.0 g/dL   HCT 33.5 (L) 39.0 - 52.0 %   MCV 90.1 80.0 -  100.0 fL   MCH 30.1 26.0 - 34.0 pg   MCHC 33.4 30.0 - 36.0 g/dL   RDW 12.5 11.5 - 15.5 %   Platelets 220 150 - 400 K/uL   nRBC 0.0 0.0 - 0.2 %   Neutrophils Relative % 79 %   Neutro Abs 6.9 1.7 - 7.7 K/uL   Lymphocytes Relative 14 %   Lymphs Abs 1.2 0.7 - 4.0 K/uL   Monocytes Relative 6 %   Monocytes Absolute 0.5 0.1 - 1.0 K/uL   Eosinophils Relative 1 %   Eosinophils Absolute 0.1 0.0 - 0.5 K/uL   Basophils Relative 0 %   Basophils Absolute 0.0 0.0 - 0.1 K/uL   Immature Granulocytes 0 %   Abs Immature Granulocytes 0.03 0.00 - 0.07 K/uL    Comment: Performed at [facility], [address] [place], [place] [address]  Comprehensive metabolic panel      Status: Abnormal   Collection Time: 01/03/19  8:51 AM  Result Value Ref Range   Sodium 135 135 - 145 mmol/L   Potassium 4.0 3.5 - 5.1 mmol/L   Chloride 103 98 - 111 mmol/L   CO2 25 22 - 32 mmol/L   Glucose, Bld 139 (H) 70 - 99 mg/dL   BUN 19 8 - 23 mg/dL   Creatinine, Ser 0.95 0.61 - 1.24 mg/dL   Calcium 8.6 (L) 8.9 - 10.3 mg/dL   Total Protein 7.1 6.5 - 8.1 g/dL   Albumin 3.7 3.5 - 5.0 g/dL   AST 22 15 - 41 U/L   ALT 19 0 - 44 U/L   Alkaline Phosphatase 48 38 - 126 U/L   Total Bilirubin 1.2 0.3 - 1.2 mg/dL   GFR calc non Af Amer >60 >60 mL/min   GFR calc Af Amer >60 >60 mL/min   Anion gap 7 5 - 15    Comment: Performed at [facility], [address] [place], [place] [address]  C-reactive protein     Status: Abnormal   Collection Time: 01/03/19  8:51 AM  Result Value Ref Range   CRP 8.5 (H) <1.0 mg/dL    Comment: Performed at [facility], [address] [place], [place] [address]  Sedimentation rate     Status: Abnormal   Collection Time: 01/03/19  8:51 AM  Result Value Ref Range   Sed Rate 54 (H) 0 - 20 mm/hr    Comment: Performed at [facility], [place], [place] [address]  Fibrin derivatives D-Dimer [facility] only)     Status: Abnormal   Collection Time: 01/03/19  8:51 AM  Result Value Ref Range   Fibrin derivatives D-dimer ([facility]) 1,334.24 (H) 0.00 - 499.00 ng/mL (FEU)    Comment: (NOTE) <> Exclusion of Venous Thromboembolism (VTE) - OUTPATIENT ONLY   (Emergency Department or [place])   0-499 ng/ml (FEU): With a low to intermediate pretest probability                      for VTE this test result excludes the diagnosis                      of VTE.   >499 ng/ml (FEU) : VTE not excluded; additional work up for VTE is                      required. <> Testing on Inpatients and Evaluation of Disseminated Intravascular  Coagulation (DIC) Reference Range:   0-499 ng/ml (FEU) Performed at [facility], [address]  [place], [place] [address]   Uric acid     Status: None   Collection Time: 01/03/19  8:51 AM  Result Value Ref Range   Uric Acid, Serum 6.0 3.7 - 8.6 mg/dL    Comment: Performed at [facility], [address], [place], [place] [address]   Dg Ankle Complete Left  Result Date: 01/03/2019 CLINICAL DATA:  Swelling. Diabetic. EXAM: LEFT ANKLE COMPLETE - 3+ VIEW COMPARISON:  None. FINDINGS: Osseous structures of the LEFT ankle appear intact and normally aligned. Bone mineralization is normal. No degenerative change. Visualized structures of the hindfoot and midfoot appear intact and normally aligned. Adjacent soft tissues are unremarkable. IMPRESSION: Negative LEFT ankle. Electronically Signed   By: Franki Cabot M.D.   On: 01/03/2019 11:38   US Venous Img Lower Unilateral Left  Result Date: 01/03/2019 CLINICAL DATA:  Left lower extremity pain and edema. EXAM: LEFT LOWER EXTREMITY VENOUS DOPPLER ULTRASOUND TECHNIQUE: Gray-scale sonography with graded compression, as well as color Doppler and duplex ultrasound were performed to evaluate the lower extremity deep venous systems from the level of the common femoral vein and including the common femoral, femoral, profunda femoral, popliteal and calf veins including the posterior tibial, peroneal and gastrocnemius veins when visible. The superficial great saphenous vein was also interrogated. Spectral Doppler was utilized to evaluate flow at rest and with distal augmentation maneuvers in the common femoral, femoral and popliteal veins. COMPARISON:  None. FINDINGS: Contralateral Common Femoral Vein: Respiratory phasicity is normal and symmetric with the symptomatic side. No evidence of thrombus. Normal compressibility. Common Femoral Vein: No evidence of thrombus. Normal compressibility, respiratory phasicity and response to augmentation. Saphenofemoral Junction: No evidence of thrombus. Normal compressibility and flow on color Doppler imaging. Profunda  Femoral Vein: No evidence of thrombus. Normal compressibility and flow on color Doppler imaging. Femoral Vein: No evidence of thrombus. Normal compressibility, respiratory phasicity and response to augmentation. Popliteal Vein: No evidence of thrombus. Normal compressibility, respiratory phasicity and response to augmentation. Calf Veins: No evidence of thrombus. Normal compressibility and flow on color Doppler imaging. Superficial Great Saphenous Vein: No evidence of thrombus. Normal compressibility. Venous Reflux:  None. Other Findings: No evidence of superficial thrombophlebitis or abnormal fluid collection. Single mildly prominent left inguinal lymph node measures 1.5 cm in short axis. This may be a reactive node. IMPRESSION: No evidence of left lower extremity deep venous thrombosis. Electronically Signed   By: Aletta Edouard M.D.   On: 01/03/2019 12:52   Dg Foot Complete Left  Result Date: 01/03/2019 CLINICAL DATA:  Pt has onychomycosis of great toe nail. History of open sore after pulling part of nail off in August of 2019, with blisters. Now with soft tissue swelling. EXAM: LEFT FOOT - COMPLETE 3+ VIEW COMPARISON:  None. FINDINGS: There are advanced destructive changes of the distal phalanx of the great toe with associated fragmentation, presumed osteomyelitis. Associated soft tissue swelling/edema. No soft tissue gas seen. Remainder the osseous structures appear intact and normal in mineralization. Mild degenerative osteoarthritis at the first MTP joint. IMPRESSION: Advanced destructive changes of the distal phalanx of the great toe with associated fragmentation, presumed osteomyelitis. Associated soft tissue swelling/edema. Electronically Signed   By: Franki Cabot M.D.   On: 01/03/2019 11:37    Pending Labs Unresulted Labs (From admission, onward)    Start     Ordered   01/03/19 1355  Blood culture (routine  x 2)  BLOOD CULTURE X 2,   STAT     01/03/19 1355   Signed and Held  HIV antibody  (Routine Testing)  Once,   R     Signed and Held   Signed and Held  Basic metabolic panel  Tomorrow morning,   R     Signed and Held   Signed and Held  CBC  Tomorrow morning,   R     Signed and Held   Signed and Held  CBC  (enoxaparin (LOVENOX)    CrCl >/= 30 ml/min)  Once,   R    Comments:  Baseline for enoxaparin therapy IF NOT ALREADY DRAWN.  Notify MD if PLT < 100 K.    Signed and Held   Signed and Held  Creatinine, serum  (enoxaparin (LOVENOX)    CrCl >/= 30 ml/min)  Once,   R    Comments:  Baseline for enoxaparin therapy IF NOT ALREADY DRAWN.    Signed and Held   Signed and Held  Creatinine, serum  (enoxaparin (LOVENOX)    CrCl >/= 30 ml/min)  Weekly,   R    Comments:  while on enoxaparin therapy    Signed and Held          Vitals/Pain Today's Vitals   01/03/19 1349 01/03/19 1350 01/03/19 1435 01/03/19 1635  BP: (Abnormal) 159/69   130/82  Pulse: 66   63  Resp: 18   17  Temp: 97.9 F (36.6 C)   97.9 F (36.6 C)  TempSrc: Oral   Oral  SpO2: 98%   96%  Weight:  74.9 kg    Height:  6' (1.829 m)    PainSc: 1   0-No pain 0-No pain    Isolation Precautions No active isolations  Medications Medications  vancomycin (VANCOCIN) 1,500 mg in sodium chloride 0.9 % 500 mL IVPB (1,500 mg Intravenous New Bag/Given 01/03/19 1619)  piperacillin-tazobactam (ZOSYN) IVPB 3.375 g (has no administration in time range)  insulin aspart (novoLOG) injection 0-9 Units (has no administration in time range)  insulin aspart (novoLOG) injection 0-5 Units (has no administration in time range)  vancomycin (VANCOCIN) IVPB 1000 mg/200 mL premix (has no administration in time range)  Tdap (BOOSTRIX) injection 0.5 mL (0.5 mLs Intramuscular Given 01/03/19 1455)  sodium chloride 0.9 % bolus 500 mL (500 mLs Intravenous New Bag/Given 01/03/19 1620)    Mobility walks Low fall risk   Focused Assessments musculoskeletal assessment   R Recommendations: See Admitting Provider Note  Report given  to:   Additional Notes: .

## 2019-01-03 NOTE — Progress Notes (Signed)
Patient a new admit to room 145. Alert and oriented. No complaints of pain. Wife at the bedside. Awaiting to see dr. Cleda Mccreedy with podiatry

## 2019-01-03 NOTE — ED Notes (Addendum)
First nurse note: pt sent by PCP c/o possible osteomyelitis to L great toe. Labs done today, D-dimer 1334, sed rate 54, CRP 8.5.

## 2019-01-03 NOTE — ED Notes (Signed)
Spoke with MD regarding labs and results. Per Dr. Jimmye Norman, draw cultures.

## 2019-01-03 NOTE — ED Triage Notes (Signed)
Pt presents to ED via POV with c/o poss osteomyelitis per PCP. Pt had labs done earlier today. Pt c/o swelling to L ankle, pain and swelling to L great toe.

## 2019-01-03 NOTE — H&P (View-Only) (Signed)
Reason for Consult: Osteomyelitis left great toe Referring Physician: Trell Secrist is an 63 y.o. male.  HPI: This is a 63 year old male who relates a history of pulling off a scab or blistered area at the tip of his left great toe at the end of August.  Subsequently had continued problems with a sore and some infection that seem to clear up around October.  Recently had some increased swelling and redness in the left foot.  Presented to his primary care where x-rays were taken that were concerning for osteomyelitis.  Decision was made for admission to the hospital for IV antibiotics and amputation of the left great toe.  Past Medical History:  Diagnosis Date  . Arthritis   . CAD (coronary artery disease) Nov 2014   diffuse, aggresive risk factor modification recommended  . Diabetes mellitus without complication ([place])   . Diabetic neuropathy ([place])   . Dyslipidemia   . ED (erectile dysfunction)   . GERD (gastroesophageal reflux disease)   . History of acute pyelonephritis Aug. 2014   hospitalized; 100k E. coli pansensitive  . Hypercholesteremia   . Hyperlipidemia   . Hypertension   . Hypertensive heart disease   . Inguinal hernia   . Sciatica   . Syncope and collapse   . Tobacco use   . Unspecified systolic (congestive) heart failure [facility])     Past Surgical History:  Procedure Laterality Date  . CARDIAC CATHETERIZATION  11/14   [facility] x1  . COLONOSCOPY  2004   [facility]  . COLONOSCOPY WITH PROPOFOL N/A 06/22/2016   Procedure: COLONOSCOPY WITH PROPOFOL;  Surgeon: Robert Bellow, MD;  Location: [facility] ENDOSCOPY;  Service: Endoscopy;  Laterality: N/A;  . ELBOW ARTHROSCOPY  2001   lt  . GUM SURGERY    . OLECRANON BURSECTOMY  11/12/2011   Procedure: OLECRANON BURSA;  Surgeon: Linna Hoff;  Location: [place];  Service: Orthopedics;  Laterality: Right;  olecracnon bursectomy with delayed closure  . WISDOM TOOTH EXTRACTION      Family History  Problem  Relation Age of Onset  . Hypertension Father   . Diabetes Father   . Heart disease Father   . Diabetes Mother   . Heart disease Mother   . Hypertension Mother   . Cancer Mother        bladder  . Diabetes Sister   . Stroke Sister        mini stroke  . Diabetes Brother   . Diabetes Sister   . Diabetes Sister   . Diabetes Sister   . Diabetes Sister     Social History:  reports that he has been smoking cigarettes. He has a 23.50 pack-year smoking history. He has never used smokeless tobacco. He reports current alcohol use. He reports that he does not use drugs.  Allergies: No Known Allergies  Medications:  Scheduled: . [START ON 01/04/2019] aspirin EC  81 mg Oral Daily  . atorvastatin  20 mg Oral Daily  . enoxaparin (LOVENOX) injection  40 mg Subcutaneous Q24H  . finasteride  5 mg Oral Daily  . gabapentin  300 mg Oral QHS  . insulin aspart  0-5 Units Subcutaneous QHS  . insulin aspart  0-9 Units Subcutaneous TID WC  . [START ON 01/04/2019] lisinopril  30 mg Oral Daily  . metFORMIN  500 mg Oral BID WC  . [START ON 01/04/2019] pantoprazole  40 mg Oral Daily  . pyridOXINE  100 mg Oral Daily  Results for orders placed or performed during the hospital encounter of 01/03/19 (from the past 48 hour(s))  Glucose, capillary     Status: None   Collection Time: 01/03/19  5:14 PM  Result Value Ref Range   Glucose-Capillary 89 70 - 99 mg/dL   Comment 1 Notify RN     Dg Ankle Complete Left  Result Date: 01/03/2019 CLINICAL DATA:  Swelling. Diabetic. EXAM: LEFT ANKLE COMPLETE - 3+ VIEW COMPARISON:  None. FINDINGS: Osseous structures of the LEFT ankle appear intact and normally aligned. Bone mineralization is normal. No degenerative change. Visualized structures of the hindfoot and midfoot appear intact and normally aligned. Adjacent soft tissues are unremarkable. IMPRESSION: Negative LEFT ankle. Electronically Signed   By: Franki Cabot M.D.   On: 01/03/2019 11:38   US Venous Img Lower  Unilateral Left  Result Date: 01/03/2019 CLINICAL DATA:  Left lower extremity pain and edema. EXAM: LEFT LOWER EXTREMITY VENOUS DOPPLER ULTRASOUND TECHNIQUE: Gray-scale sonography with graded compression, as well as color Doppler and duplex ultrasound were performed to evaluate the lower extremity deep venous systems from the level of the common femoral vein and including the common femoral, femoral, profunda femoral, popliteal and calf veins including the posterior tibial, peroneal and gastrocnemius veins when visible. The superficial great saphenous vein was also interrogated. Spectral Doppler was utilized to evaluate flow at rest and with distal augmentation maneuvers in the common femoral, femoral and popliteal veins. COMPARISON:  None. FINDINGS: Contralateral Common Femoral Vein: Respiratory phasicity is normal and symmetric with the symptomatic side. No evidence of thrombus. Normal compressibility. Common Femoral Vein: No evidence of thrombus. Normal compressibility, respiratory phasicity and response to augmentation. Saphenofemoral Junction: No evidence of thrombus. Normal compressibility and flow on color Doppler imaging. Profunda Femoral Vein: No evidence of thrombus. Normal compressibility and flow on color Doppler imaging. Femoral Vein: No evidence of thrombus. Normal compressibility, respiratory phasicity and response to augmentation. Popliteal Vein: No evidence of thrombus. Normal compressibility, respiratory phasicity and response to augmentation. Calf Veins: No evidence of thrombus. Normal compressibility and flow on color Doppler imaging. Superficial Great Saphenous Vein: No evidence of thrombus. Normal compressibility. Venous Reflux:  None. Other Findings: No evidence of superficial thrombophlebitis or abnormal fluid collection. Single mildly prominent left inguinal lymph node measures 1.5 cm in short axis. This may be a reactive node. IMPRESSION: No evidence of left lower extremity deep venous  thrombosis. Electronically Signed   By: Aletta Edouard M.D.   On: 01/03/2019 12:52   Dg Foot Complete Left  Result Date: 01/03/2019 CLINICAL DATA:  Pt has onychomycosis of great toe nail. History of open sore after pulling part of nail off in August of 2019, with blisters. Now with soft tissue swelling. EXAM: LEFT FOOT - COMPLETE 3+ VIEW COMPARISON:  None. FINDINGS: There are advanced destructive changes of the distal phalanx of the great toe with associated fragmentation, presumed osteomyelitis. Associated soft tissue swelling/edema. No soft tissue gas seen. Remainder the osseous structures appear intact and normal in mineralization. Mild degenerative osteoarthritis at the first MTP joint. IMPRESSION: Advanced destructive changes of the distal phalanx of the great toe with associated fragmentation, presumed osteomyelitis. Associated soft tissue swelling/edema. Electronically Signed   By: Franki Cabot M.D.   On: 01/03/2019 11:37    Review of Systems  Constitutional: Negative for chills and fever.  HENT: Negative for congestion and hearing loss.   Eyes: Negative for blurred vision and double vision.  Respiratory: Negative for cough and shortness of breath.  Cardiovascular: Negative for chest pain and palpitations.  Gastrointestinal: Negative for nausea and vomiting.  Genitourinary: Negative for dysuria and frequency.  Musculoskeletal:       Patient relates some pain in his ankle  Skin:       Some swelling in the left foot and ankle.  Denies any recent drainage from the ulcerative area  Neurological:       Does relate some numbness and paresthesias from his diabetes  Endo/Heme/Allergies: Does not bruise/bleed easily.  Psychiatric/Behavioral: Negative for depression and memory loss.   Blood pressure (!) 146/89, pulse 68, temperature 98.4 F (36.9 C), temperature source Oral, resp. rate 17, height 6' (1.829 m), weight 74.9 kg, SpO2 97 %. Physical Exam  Cardiovascular:  DP and PT pulses are  fully palpable bilateral.  Musculoskeletal:     Comments: Adequate range of motion of the pedal joints with exception of some limited motion of the first MTPJ on the left.  Some lateral deviation of the distal hallux on the left.  Muscle testing is deferred.  Neurological:  Loss of protective threshold with a monofilament wire distally in the forefoot and toes bilateral.  Proprioception impaired  Skin:  The skin is warm dry and supple.  Some diffuse edema in the left hallux.  Scabbed over lesion distally with no acute fluid collections or drainage at this point.  No significant cellulitis.    Assessment/Plan: Assessment: 1.  Osteomyelitis left hallux. 2.  Diabetes with associated neuropathy.  Plan: Discussed with the patient that his x-ray findings are consistent with osteomyelitis in his great toe.  Even though clinically it does not appear that bad recommended that we consider amputation of the distal aspect of the great toe which he consents to.  We discussed possible risks and complications of the procedure and anesthesia including inability of the toe to heal due to his diabetes or potential infection.  No guarantees could be given.  Elects to proceed with surgery for partial amputation of the left great toe tomorrow.  N.p.o. after breakfast.  We will have an early tray with nothing to eat after 8.  Consent form for partial amputation left great toe.  Plan for surgery tomorrow evening after work  Durward Fortes 01/03/2019, 6:39 PM

## 2019-01-04 ENCOUNTER — Inpatient Hospital Stay: Payer: 59 | Admitting: Certified Registered"

## 2019-01-04 ENCOUNTER — Telehealth: Payer: Self-pay | Admitting: Family Medicine

## 2019-01-04 ENCOUNTER — Encounter
Admission: EM | Disposition: A | Discharge status: Home / Self Care | Payer: Self-pay | Source: Home / Self Care | Attending: Internal Medicine

## 2019-01-04 HISTORY — PX: AMPUTATION: SHX166

## 2019-01-04 LAB — GLUCOSE, CAPILLARY
GLUCOSE-CAPILLARY: 107 mg/dL — AB (ref 70–99)
Glucose-Capillary: 102 mg/dL — ABNORMAL HIGH (ref 70–99)
Glucose-Capillary: 104 mg/dL — ABNORMAL HIGH (ref 70–99)
Glucose-Capillary: 115 mg/dL — ABNORMAL HIGH (ref 70–99)

## 2019-01-04 LAB — BASIC METABOLIC PANEL
Anion gap: 6 (ref 5–15)
BUN: 16 mg/dL (ref 8–23)
CO2: 25 mmol/L (ref 22–32)
CREATININE: 0.8 mg/dL (ref 0.61–1.24)
Calcium: 8.5 mg/dL — ABNORMAL LOW (ref 8.9–10.3)
Chloride: 109 mmol/L (ref 98–111)
GFR calc Af Amer: >60 mL/min (ref >60)
GFR calc non Af Amer: >60 mL/min (ref >60)
Glucose, Bld: 101 mg/dL — ABNORMAL HIGH (ref 70–99)
Potassium: 4.1 mmol/L (ref 3.5–5.1)
Sodium: 140 mmol/L (ref 135–145)

## 2019-01-04 LAB — CBC
HCT: 30.1 % — ABNORMAL LOW (ref 39.0–52.0)
Hemoglobin: 9.8 g/dL — ABNORMAL LOW (ref 13.0–17.0)
MCH: 29.2 pg (ref 26.0–34.0)
MCHC: 32.6 g/dL (ref 30.0–36.0)
MCV: 89.6 fL (ref 80.0–100.0)
PLATELETS: 180 10*3/uL (ref 150–400)
RBC: 3.36 MIL/uL — ABNORMAL LOW (ref 4.22–5.81)
RDW: 12.4 % (ref 11.5–15.5)
WBC: 6 10*3/uL (ref 4.0–10.5)
nRBC: 0 % (ref 0.0–0.2)

## 2019-01-04 SURGERY — AMPUTATION, FOOT, RAY
Anesthesia: General | Site: First Toe | Laterality: Left

## 2019-01-04 MED ORDER — MIDAZOLAM HCL 2 MG/2ML IJ SOLN
INTRAMUSCULAR | Status: AC
  Filled 2019-01-04: qty 2

## 2019-01-04 MED ORDER — FENTANYL CITRATE (PF) 100 MCG/2ML IJ SOLN: 25.0000 ug | INTRAMUSCULAR | Status: DC | PRN

## 2019-01-04 MED ORDER — FENTANYL CITRATE (PF) 100 MCG/2ML IJ SOLN
INTRAMUSCULAR | Status: AC
  Filled 2019-01-04: qty 2

## 2019-01-04 MED ORDER — HYDROCODONE-ACETAMINOPHEN 5-325 MG PO TABS
1.0000 | ORAL_TABLET | ORAL | Status: DC | PRN
  Administered 2019-01-04: 1 via ORAL
  Filled 2019-01-04: qty 1

## 2019-01-04 MED ORDER — ONDANSETRON HCL 4 MG/2ML IJ SOLN
INTRAMUSCULAR | Status: DC | PRN
  Administered 2019-01-04: 4 mg via INTRAVENOUS

## 2019-01-04 MED ORDER — SODIUM CHLORIDE 0.9 % IV SOLN
INTRAVENOUS | Status: DC
  Administered 2019-01-04 (×2): via INTRAVENOUS

## 2019-01-04 MED ORDER — EPHEDRINE SULFATE 50 MG/ML IJ SOLN
INTRAMUSCULAR | Status: DC | PRN
  Administered 2019-01-04 (×2): 10 mg via INTRAVENOUS

## 2019-01-04 MED ORDER — PROPOFOL 10 MG/ML IV BOLUS
INTRAVENOUS | Status: DC | PRN
  Administered 2019-01-04: 180 mg via INTRAVENOUS

## 2019-01-04 MED ORDER — SEVOFLURANE IN SOLN
RESPIRATORY_TRACT | Status: AC
  Filled 2019-01-04: qty 250

## 2019-01-04 MED ORDER — MIDAZOLAM HCL 2 MG/2ML IJ SOLN
INTRAMUSCULAR | Status: DC | PRN
  Administered 2019-01-04 (×2): 2 mg via INTRAVENOUS

## 2019-01-04 MED ORDER — PROPOFOL 10 MG/ML IV BOLUS
INTRAVENOUS | Status: AC
  Filled 2019-01-04: qty 40

## 2019-01-04 MED ORDER — PROPOFOL 10 MG/ML IV BOLUS
INTRAVENOUS | Status: AC
  Filled 2019-01-04: qty 20

## 2019-01-04 MED ORDER — FENTANYL CITRATE (PF) 100 MCG/2ML IJ SOLN
INTRAMUSCULAR | Status: DC | PRN
  Administered 2019-01-04: 25 ug via INTRAVENOUS

## 2019-01-04 MED ORDER — NEOMYCIN-POLYMYXIN B GU 40-200000 IR SOLN
Status: DC | PRN
  Administered 2019-01-04: 2 mL

## 2019-01-04 MED ORDER — BUPIVACAINE HCL 0.5 % IJ SOLN
INTRAMUSCULAR | Status: DC | PRN
  Administered 2019-01-04: 10 mL

## 2019-01-04 SURGICAL SUPPLY — 47 items
BANDAGE ACE 4X5 VEL STRL LF (GAUZE/BANDAGES/DRESSINGS) ×2 IMPLANT
BLADE MED AGGRESSIVE (BLADE) ×2 IMPLANT
BLADE OSC/SAGITTAL MD 5.5X18 (BLADE) ×1 IMPLANT
BLADE SURG 15 STRL LF DISP TIS (BLADE) ×2 IMPLANT
BLADE SURG 15 STRL SS (BLADE) ×4
BLADE SURG MINI STRL (BLADE) ×2 IMPLANT
BNDG CONFORM 2 STRL LF (GAUZE/BANDAGES/DRESSINGS) ×2 IMPLANT
BNDG ESMARK 4X12 TAN STRL LF (GAUZE/BANDAGES/DRESSINGS) ×2 IMPLANT
BNDG GAUZE 4.5X4.1 6PLY STRL (MISCELLANEOUS) ×2 IMPLANT
CANISTER SUCT 1200ML W/VALVE (MISCELLANEOUS) ×2 IMPLANT
COVER WAND RF STERILE (DRAPES) ×1 IMPLANT
CUFF TOURN 18 STER (MISCELLANEOUS) ×2 IMPLANT
CUFF TOURN DUAL PL 12 NO SLV (MISCELLANEOUS) ×1 IMPLANT
DRAPE FLUOR MINI C-ARM 54X84 (DRAPES) ×1 IMPLANT
DURAPREP 26ML APPLICATOR (WOUND CARE) ×2 IMPLANT
ELECT REM PT RETURN 9FT ADLT (ELECTROSURGICAL) ×2
ELECTRODE REM PT RTRN 9FT ADLT (ELECTROSURGICAL) ×1 IMPLANT
GAUZE PETRO XEROFOAM 1X8 (MISCELLANEOUS) ×2 IMPLANT
GAUZE SPONGE 4X4 12PLY STRL (GAUZE/BANDAGES/DRESSINGS) ×2 IMPLANT
GLOVE BIO SURGEON STRL SZ7.5 (GLOVE) ×4 IMPLANT
GLOVE INDICATOR 8.0 STRL GRN (GLOVE) ×4 IMPLANT
GOWN STRL REUS W/ TWL LRG LVL3 (GOWN DISPOSABLE) ×2 IMPLANT
GOWN STRL REUS W/TWL LRG LVL3 (GOWN DISPOSABLE) ×6
HANDPIECE VERSAJET DEBRIDEMENT (MISCELLANEOUS) ×1 IMPLANT
KIT TURNOVER KIT A (KITS) ×2 IMPLANT
LABEL OR SOLS (LABEL) ×2 IMPLANT
NDL FILTER BLUNT 18X1 1/2 (NEEDLE) ×1 IMPLANT
NDL HYPO 25X1 1.5 SAFETY (NEEDLE) ×2 IMPLANT
NEEDLE FILTER BLUNT 18X 1/2SAF (NEEDLE) ×1
NEEDLE FILTER BLUNT 18X1 1/2 (NEEDLE) ×1 IMPLANT
NEEDLE HYPO 25X1 1.5 SAFETY (NEEDLE) ×4 IMPLANT
NS IRRIG 500ML POUR BTL (IV SOLUTION) ×2 IMPLANT
PACK EXTREMITY ARMC (MISCELLANEOUS) ×2 IMPLANT
SOL .9 NS 3000ML IRR  AL (IV SOLUTION)
SOL .9 NS 3000ML IRR AL (IV SOLUTION)
SOL .9 NS 3000ML IRR UROMATIC (IV SOLUTION) ×1 IMPLANT
SOL PREP PVP 2OZ (MISCELLANEOUS) ×2
SOLUTION PREP PVP 2OZ (MISCELLANEOUS) ×1 IMPLANT
STOCKINETTE STRL 6IN 960660 (GAUZE/BANDAGES/DRESSINGS) ×2 IMPLANT
STRIP CLOSURE SKIN 1/4X4 (GAUZE/BANDAGES/DRESSINGS) ×1 IMPLANT
SUT ETHILON 3-0 FS-10 30 BLK (SUTURE) ×2
SUT ETHILON 4-0 (SUTURE) ×2
SUT ETHILON 4-0 FS2 18XMFL BLK (SUTURE) ×1
SUTURE EHLN 3-0 FS-10 30 BLK (SUTURE) ×1 IMPLANT
SUTURE ETHLN 4-0 FS2 18XMF BLK (SUTURE) IMPLANT
SWAB DUAL CULTURE TRANS RED ST (MISCELLANEOUS) ×1 IMPLANT
SYR 10ML LL (SYRINGE) ×2 IMPLANT

## 2019-01-04 NOTE — Op Note (Signed)
Date of operation: 01/04/2019.  Surgeon: Durward Fortes D.P.M.  Preoperative diagnosis: Osteomyelitis left great toe.  Postoperative diagnosis: Same.  Procedure: Partial amputation left great toe.  Anesthesia: LMA with local.  Hemostasis: Pneumatic tourniquet left ankle 250 mmHg.  Estimated blood loss: Less than 5 cc.  Pathology: Left great toe.  Cultures: Bone culture distal phalanx left great toe.  Complications: None apparent.  Operative indications: This is a 63 year old male with a history of a chronic sore on his left great toe.  Recent increased swelling and redness.  X-rays were taken outpatient and radiographic evidence for significant osteomyelitis noted in the great toe.  Decision was made for amputation.  Operative procedure: Patient was taken to the operating room and placed on the table in the supine position.  Following satisfactory LMA anesthesia the left forefoot was anesthetized with 10 cc of 0.5% bupivacaine plain around the first metatarsal.  A pneumatic tourniquet was applied at the level of the left ankle and the foot was prepped and draped in the usual sterile fashion.  The foot was exsanguinated and the tourniquet inflated to 250 mmHg.    Attention was then directed to the distal aspect of the left great toe where a fishmouth type incision was made from medial to lateral over the great toe.  Incision was deepened sharply down to the level of the bone and dissection carried back proximally to the inner phalangeal joint.  Using a sagittal saw the head of the proximal phalanx was resected and the distal aspect of the toe was removed in toto.  Good healthy tissues were noted.  The wound was flushed with copious amounts of sterile saline and closed using 3-0 nylon vertical mattress and simple interrupted sutures.  Xeroform 4 x 4's and con form applied to the surgical site.  Tourniquet was released.  Kerlix and an Ace wrap then applied.  Patient was awakened and transferred  to the PACU having tolerated the procedure well with vital signs in good condition.

## 2019-01-04 NOTE — Interval H&P Note (Signed)
History and Physical Interval Note:  01/04/2019 6:19 PM  Brian Dean  has presented today for surgery, with the diagnosis of Osteomyolitis left great toe  The various methods of treatment have been discussed with the patient and family. After consideration of risks, benefits and other options for treatment, the patient has consented to  Procedure(s): AMPUTATION RAY LEFT GREAT TOE (Left) as a surgical intervention .  The patient's history has been reviewed, patient examined, no change in status, stable for surgery.  I have reviewed the patient's chart and labs.  Questions were answered to the patient's satisfaction.     Durward Fortes

## 2019-01-04 NOTE — Progress Notes (Signed)
[facility] [place] NAME: Brian Dean    MR#:  [id number]  DATE OF BIRTH:  Mar 17, 1956  SUBJECTIVE:  CHIEF COMPLAINT:   Chief Complaint  Patient presents with  . Toe Pain   No new complaint this morning.  No pains.  Awaiting partial amputation by podiatrist today. No fevers. REVIEW OF SYSTEMS:  Review of Systems  Constitutional: Negative for chills, fever and weight loss.  HENT: Negative for hearing loss and tinnitus.   Eyes: Negative for blurred vision and double vision.  Respiratory: Negative for cough and hemoptysis.   Cardiovascular: Negative for chest pain.  Gastrointestinal: Negative for abdominal pain, heartburn, nausea and vomiting.  Genitourinary: Negative for dysuria and urgency.  Skin: Negative for itching and rash.  Neurological: Negative for dizziness and headaches.  Psychiatric/Behavioral: Negative for depression and suicidal ideas.      DRUG ALLERGIES:  No Known Allergies VITALS:  Blood pressure (!) 147/74, pulse 62, temperature 98.7 F (37.1 C), temperature source Oral, resp. rate 18, height 6' (1.829 m), weight 74.9 kg, SpO2 99 %. PHYSICAL EXAMINATION:   GENERAL:  63 y.o.-year-old patient lying in the bed with no acute distress.  EYES: Pupils equal, round, reactive to light and accommodation. No scleral icterus. Extraocular muscles intact.  HEENT: Head atraumatic, normocephalic. Oropharynx and nasopharynx clear. No oropharyngeal erythema, moist oral mucosa  NECK:  Supple, no jugular venous distention. No thyroid enlargement, no tenderness.  LUNGS: Normal breath sounds bilaterally, no wheezing, rales, rhonchi. No use of accessory muscles of respiration.  CARDIOVASCULAR: S1, S2 RRR. No murmurs, rubs, gallops, clicks.  ABDOMEN: Soft, nontender, nondistended. Bowel sounds present. No organomegaly or mass.  EXTREMITIES: No pedal edema, cyanosis, or clubbing. +1- 2 pedal & radial pulses b/l.  Left great toe  swelling improved as confirmed by patient  Neuro exam.  Patient awake and alert and oriented.  Moving all extremities with no focal deficits .  LABORATORY PANEL:  Male CBC Recent Labs  Lab 01/04/19 0338  WBC 6.0  HGB 9.8*  HCT 30.1*  PLT 180   ------------------------------------------------------------------------------------------------------------------ Chemistries  Recent Labs  Lab 01/03/19 0851 01/04/19 0338  NA 135 140  K 4.0 4.1  CL 103 109  CO2 25 25  GLUCOSE 139* 101*  BUN 19 16  CREATININE 0.95 0.80  CALCIUM 8.6* 8.5*  AST 22  --   ALT 19  --   ALKPHOS 48  --   BILITOT 1.2  --    RADIOLOGY:  No results found. ASSESSMENT AND PLAN:   63 year old male with past medical history of diabetes, hypertension, diabetic neuropathy, GERD, hyperlipidemia who presented to the hospital due to left great toe lesion with left ankle swelling.  1.  Left foot/distal phalanx of the great toe osteomyelitis- this is a source of patient's left ankle pain and a lesion noted on the left toe.  Patient is clinically afebrile and hemodynamically stable with a normal white cell count. -Patient's x-ray is suggestive of presumed osteomyelitis.    Patient seen by podiatrist with plans for partial amputation today.  Continue broad-spectrum IV antibiotics with vancomycin and Zosyn pending results of cultures Left lower extremity venous Doppler ultrasound done with no evidence of DVT.  2.  Essential hypertension-continue lisinopril.  3.  Diabetes type 2 with neuropathy-continue metformin, will place on sliding scale insulin.  4.  Diabetic neuropathy-continue gabapentin.  5.  BPH-no urine retention.  Continue Proscar.  6.  GERD-continue Protonix.  7.  Hyperlipidemia-continue  atorvastatin.  DVT prophylaxis; Lovenox   All the records are reviewed and case discussed with Care Management/Social Worker. Management plans discussed with the patient, family and they are in  agreement.  CODE STATUS: Full Code  TOTAL TIME TAKING CARE OF THIS PATIENT: 27 minutes.   More than 50% of the time was spent in counseling/coordination of care: YES  POSSIBLE D/C IN 2 DAYS, DEPENDING ON CLINICAL CONDITION.   Brian Dean M.D on 01/04/2019 at 1:53 PM  Between 7am to 6pm - Pager - [phone]  After 6pm go to [email or web address] - Proofreader  [facility]  Office  [phone]  CC: Primary care physician; Arnetha Courser, MD  Note: This dictation was prepared with Dragon dictation along with smaller phrase technology. Any transcriptional errors that result from this process are unintentional.

## 2019-01-04 NOTE — Transfer of Care (Signed)
Immediate Anesthesia Transfer of Care Note  Patient: Brian Dean  Procedure(s) Performed: AMPUTATION RAY LEFT GREAT TOE (Left First Toe)  Patient Location: PACU  Anesthesia Type:General  Level of Consciousness: awake, alert  and oriented  Airway & Oxygen Therapy: Patient Spontanous Breathing  Post-op Assessment: Report given to RN and Post -op Vital signs reviewed and stable  Post vital signs: Reviewed and stable  Last Vitals:  Vitals Value Taken Time  BP    Temp    Pulse    Resp    SpO2      Last Pain:  Vitals:   01/04/19 1000  TempSrc:   PainSc: 0-No pain         Complications: No apparent anesthesia complications

## 2019-01-04 NOTE — Anesthesia Preprocedure Evaluation (Addendum)
Anesthesia Evaluation    History of Anesthesia Complications Negative for: history of anesthetic complications  Airway Mallampati: I  TM Distance: >3 FB Neck ROM: full    Dental  (+) Poor Dentition, Missing, Dental Advidsory Given   Pulmonary neg shortness of breath, neg sleep apnea, neg COPD, neg recent URI, Current Smoker,           Cardiovascular Exercise Tolerance: Good hypertension, (-) angina+ CAD, + Past MI, + Cardiac Stents and +CHF  (-) CABG (-) dysrhythmias (-) Valvular Problems/Murmurs     Neuro/Psych neg Seizures  Neuromuscular disease    GI/Hepatic Neg liver ROS, GERD  ,  Endo/Other  diabetes, Type 2  Renal/GU negative Renal ROS     Musculoskeletal  (+) Arthritis ,   Abdominal   Peds  Hematology negative hematology ROS (+)   Anesthesia Other Findings Past Medical History: No date: Arthritis Nov 2014: CAD (coronary artery disease)     Comment:  diffuse, aggresive risk factor modification recommended No date: Diabetes mellitus without complication ([facility]) No date: Diabetic neuropathy ([facility]) No date: Dyslipidemia No date: ED (erectile dysfunction) No date: GERD (gastroesophageal reflux disease) Aug. 2014: History of acute pyelonephritis     Comment:  hospitalized; 100k E. coli pansensitive No date: Hypercholesteremia No date: Hyperlipidemia No date: Hypertension No date: Hypertensive heart disease No date: Inguinal hernia No date: Sciatica No date: Syncope and collapse No date: Tobacco use No date: Unspecified systolic (congestive) heart failure (H[facility])   Reproductive/Obstetrics negative OB ROS                            Anesthesia Physical Anesthesia Plan  ASA: III  Anesthesia Plan: General   Post-op Pain Management:    Induction: Intravenous  PONV Risk Score and Plan: 1 and Ondansetron, Dexamethasone, Treatment may vary due to age or medical condition and  Midazolam  Airway Management Planned: LMA  Additional Equipment:   Intra-op Plan:   Post-operative Plan: Extubation in OR  Informed Consent: I have reviewed the patients History and Physical, chart, labs and discussed the procedure including the risks, benefits and alternatives for the proposed anesthesia with the patient or authorized representative who has indicated his/her understanding and acceptance.     Dental Advisory Given  Plan Discussed with: Anesthesiologist and Surgeon  Anesthesia Plan Comments:         Anesthesia Quick Evaluation

## 2019-01-04 NOTE — Anesthesia Procedure Notes (Signed)
Procedure Name: LMA Insertion Date/Time: 01/04/2019 6:52 PM Performed by: Lendon Colonel, CRNA Pre-anesthesia Checklist: Patient identified, Patient being monitored, Timeout performed, Emergency Drugs available and Suction available Patient Re-evaluated:Patient Re-evaluated prior to induction Oxygen Delivery Method: Circle system utilized Preoxygenation: Pre-oxygenation with 100% oxygen Induction Type: IV induction Ventilation: Mask ventilation without difficulty LMA: LMA inserted LMA Size: 4.0 Tube type: Oral Number of attempts: 1 Placement Confirmation: positive ETCO2 and breath sounds checked- equal and bilateral Tube secured with: Tape Dental Injury: Teeth and Oropharynx as per pre-operative assessment

## 2019-01-04 NOTE — Telephone Encounter (Signed)
Late Tuesday night, recalled NP mentioned high D-dimer, patient admitted, now plan for amputation Wed 01/04/2019 I called to speak to hospitalist, talked to nurse Vicente Males, identified myself as his primary care provider I did not have a password so could not get any information or talk to hospitalist I asked if she could at least pass along my concern to hospitalist covering now to make sure checking for septic thrombus had been considered, D-dimer was very high; I didn't know at the time of my phone call if he's had a venous duplex or other scanning

## 2019-01-04 NOTE — Anesthesia Post-op Follow-up Note (Signed)
Anesthesia QCDR form completed.        

## 2019-01-04 NOTE — Telephone Encounter (Signed)
Addendum to earlier note: routing to see if DVT considered in ddx with elevated D-dimer

## 2019-01-05 ENCOUNTER — Encounter: Payer: Self-pay | Admitting: Podiatry

## 2019-01-05 LAB — BASIC METABOLIC PANEL
Anion gap: 10 (ref 5–15)
BUN: 14 mg/dL (ref 8–23)
CO2: 21 mmol/L — ABNORMAL LOW (ref 22–32)
Calcium: 8.3 mg/dL — ABNORMAL LOW (ref 8.9–10.3)
Chloride: 102 mmol/L (ref 98–111)
Creatinine, Ser: 0.67 mg/dL (ref 0.61–1.24)
GFR calc Af Amer: >60 mL/min (ref >60)
GFR calc non Af Amer: >60 mL/min (ref >60)
Glucose, Bld: 170 mg/dL — ABNORMAL HIGH (ref 70–99)
Potassium: 3.9 mmol/L (ref 3.5–5.1)
Sodium: 133 mmol/L — ABNORMAL LOW (ref 135–145)

## 2019-01-05 LAB — CBC
HCT: 32.5 % — ABNORMAL LOW (ref 39.0–52.0)
Hemoglobin: 10.8 g/dL — ABNORMAL LOW (ref 13.0–17.0)
MCH: 30 pg (ref 26.0–34.0)
MCHC: 33.2 g/dL (ref 30.0–36.0)
MCV: 90.3 fL (ref 80.0–100.0)
PLATELETS: 208 10*3/uL (ref 150–400)
RBC: 3.6 MIL/uL — ABNORMAL LOW (ref 4.22–5.81)
RDW: 12.1 % (ref 11.5–15.5)
WBC: 7.3 10*3/uL (ref 4.0–10.5)
nRBC: 0 % (ref 0.0–0.2)

## 2019-01-05 LAB — GLUCOSE, CAPILLARY
Glucose-Capillary: 91 mg/dL (ref 70–99)
Glucose-Capillary: 150 mg/dL — ABNORMAL HIGH (ref 70–99)

## 2019-01-05 LAB — MAGNESIUM: Magnesium: 1.6 mg/dL — ABNORMAL LOW (ref 1.7–2.4)

## 2019-01-05 LAB — HIV ANTIBODY (ROUTINE TESTING W REFLEX): HIV Screen 4th Generation wRfx: NONREACTIVE

## 2019-01-05 MED ORDER — MAGNESIUM SULFATE IN D5W 1-5 GM/100ML-% IV SOLN
1.0000 g | Freq: Once | INTRAVENOUS | Status: AC
  Administered 2019-01-05: 1 g via INTRAVENOUS
  Filled 2019-01-05: qty 100

## 2019-01-05 MED ORDER — HYDROCODONE-ACETAMINOPHEN 5-325 MG PO TABS: 1.0000 | ORAL_TABLET | Freq: Four times a day (QID) | ORAL | 0 refills | Status: DC | PRN

## 2019-01-05 MED ORDER — AMOXICILLIN-POT CLAVULANATE 875-125 MG PO TABS: 1.0000 | ORAL_TABLET | Freq: Two times a day (BID) | ORAL | 0 refills | Status: AC

## 2019-01-05 NOTE — Discharge Summary (Signed)
[facility] NAME: Brian Dean    MR#:  [id number]  DATE OF BIRTH:  [birth year]  DATE OF ADMISSION:  01/03/2019   ADMITTING PHYSICIAN: Henreitta Leber, MD  DATE OF DISCHARGE: 01/05/2019  PRIMARY CARE PHYSICIAN: Arnetha Courser, MD   ADMISSION DIAGNOSIS:  Osteomyelitis of great toe ([place]) [M86.9] DISCHARGE DIAGNOSIS:  Active Problems:   Osteomyelitis of left foot ([place])  SECONDARY DIAGNOSIS:   Past Medical History:  Diagnosis Date  . Arthritis   . CAD (coronary artery disease) Nov 2014   diffuse, aggresive risk factor modification recommended  . Diabetes mellitus without complication ([place])   . Diabetic neuropathy ([place])   . Dyslipidemia   . ED (erectile dysfunction)   . GERD (gastroesophageal reflux disease)   . History of acute pyelonephritis Aug. 2014   hospitalized; 100k E. coli pansensitive  . Hypercholesteremia   . Hyperlipidemia   . Hypertension   . Hypertensive heart disease   . Inguinal hernia   . Sciatica   . Syncope and collapse   . Tobacco use   . Unspecified systolic (congestive) heart failure [facility])    HOSPITAL COURSE:  Chief complaint; toe pain  History of presenting complaint Brian Dean  is a 63 y.o. male with a known history of coronary artery disease, diabetes, diabetic neuropathy, hypertension, hyperlipidemia, tobacco abuse, GERD who presented to the hospital due to left ankle/foot swelling.  Patient says he has been having swelling in his left foot ankle over the past week or so and gets worse as the day progresses.  Patient underwent a x-ray of his left foot which was suggestive of osteomyelitis of the distal phalanx of the left great toe.  Patient admitted to medical service.  Podiatrist consulted.  Please refer to the H&P for further details.   Hospital course; 1. Left foot/distal phalanx of the great toe osteomyelitis- this is a source of patient's left ankle pain and a lesion noted on  the left toe. Patient's x-ray is suggestive of presumed osteomyelitis.   Patient seen by podiatrist and had partial amputation of left great toe done by podiatrist.  This morning patient reevaluated by podiatrist.  Dressing intact.  Recommendation is to keep a bandage on the left foot dry and intact and not to remove until follow-up with podiatrist in 1 week.  Weightbearing as tolerated in his surgical shoe.  Patient was initially covered with broad-spectrum IV antibiotics with vancomycin and Zosyn.  Podiatrist recommended discharge on p.o. Augmentin for the next 7 days and to follow-up with podiatrist in clinic. Left lower extremity venous Doppler ultrasound done with no evidence of DVT.  2. Essential hypertension-continue lisinopril.  Outpatient monitoring by primary care physician  3. 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    4. Diabetic neuropathy-continue gabapentin.  5. BPH-no urine retention. Continue Proscar.  6. GERD-continue Protonix.  7. Hyperlipidemia-continue atorvastatin.  DISCHARGE CONDITIONS:  Stable CONSULTS OBTAINED:   DRUG ALLERGIES:  No Known Allergies DISCHARGE MEDICATIONS:   Allergies as of 01/05/2019   No Known Allergies     Medication List    STOP taking these medications   amoxicillin 875 MG tablet Commonly known as:  AMOXIL     TAKE these medications   amoxicillin-clavulanate 875-125 MG tablet Commonly known as:  AUGMENTIN Take 1 tablet by mouth 2 (two) times daily for 7 days.   aspirin 81 MG tablet Take 81 mg by mouth daily. Take 1 tablet (  81 mg) by mouth once daily   atorvastatin 20 MG tablet Commonly known as:  LIPITOR Take 1 tablet (20 mg total) by mouth daily.   CINNAMON PO Take 1,000 mg by mouth 2 (two) times daily. Reported on 05/06/2016   finasteride 5 MG tablet Commonly known as:  PROSCAR Take 1 tablet (5 mg total) by mouth daily.   gabapentin 300 MG capsule Commonly known as:  NEURONTIN TAKE 1  CAPSULE BY MOUTH EVERYDAY AT BEDTIME   HYDROcodone-acetaminophen 5-325 MG tablet Commonly known as:  NORCO/VICODIN Take 1 tablet by mouth every 6 (six) hours as needed for moderate pain or severe pain.   lisinopril 30 MG tablet Commonly known as:  PRINIVIL,ZESTRIL Take 1 tablet (30 mg total) by mouth daily.   metFORMIN 500 MG tablet Commonly known as:  GLUCOPHAGE Take 1 tablet (500 mg total) by mouth 2 (two) times daily with a meal.   nitroGLYCERIN 0.4 MG SL tablet Commonly known as:  NITROSTAT Place 1 tablet (0.4 mg total) under the tongue every 5 (five) minutes as needed.   pantoprazole 40 MG tablet Commonly known as:  PROTONIX TAKE 1 TABLET BY MOUTH EVERY DAY   pyridOXINE 100 MG tablet Commonly known as:  VITAMIN B-6 Take 100 mg by mouth daily. Reported on 05/06/2016   tadalafil 5 MG tablet Commonly known as:  CIALIS Take 5 mg by mouth daily. Reported on 05/06/2016        DISCHARGE INSTRUCTIONS:   DIET:  Diabetic diet DISCHARGE CONDITION:  Stable ACTIVITY:  Activity as tolerated.Weightbearing as tolerated in his surgical shoe OXYGEN:  Home Oxygen: No.  Oxygen Delivery: room air DISCHARGE LOCATION:  home   If you experience worsening of your admission symptoms, develop shortness of breath, life threatening emergency, suicidal or homicidal thoughts you must seek medical attention immediately by calling 911 or calling your MD immediately  if symptoms less severe.  You Must read complete instructions/literature along with all the possible adverse reactions/side effects for all the Medicines you take and that have been prescribed to you. Take any new Medicines after you have completely understood and accpet all the possible adverse reactions/side effects.   Please note  You were cared for by a hospitalist during your hospital stay. If you have any questions about your discharge medications or the care you received while you were in the hospital after you are  discharged, you can call the unit and asked to speak with the hospitalist on call if the hospitalist that took care of you is not available. Once you are discharged, your primary care physician will handle any further medical issues. Please note that NO REFILLS for any discharge medications will be authorized once you are discharged, as it is imperative that you return to your primary care physician (or establish a relationship with a primary care physician if you do not have one) for your aftercare needs so that they can reassess your need for medications and monitor your lab values.    On the day of Discharge:  VITAL SIGNS:  Blood pressure (!) 144/87, pulse 60, temperature 97.7 F (36.5 C), temperature source Oral, resp. rate 18, height 6' (1.829 m), weight 74.9 kg, SpO2 97 %. PHYSICAL EXAMINATION:  GENERAL:  62 y.o.-year-old patient lying in the bed with no acute distress.  EYES: Pupils equal, round, reactive to light and accommodation. No scleral icterus. Extraocular muscles intact.  HEENT: Head atraumatic, normocephalic. Oropharynx and nasopharynx clear.  NECK:  Supple, no jugular venous distention. No  thyroid enlargement, no tenderness.  LUNGS: Normal breath sounds bilaterally, no wheezing, rales,rhonchi or crepitation. No use of accessory muscles of respiration.  CARDIOVASCULAR: S1, S2 normal. No murmurs, rubs, or gallops.  ABDOMEN: Soft, non-tender, non-distended. Bowel sounds present. No organomegaly or mass.  EXTREMITIES: No pedal edema.  Dressing in place to left foot status post partial left great toe amputation.  NEUROLOGIC: Cranial nerves II through XII are intact. Muscle strength 5/5 in all extremities. Sensation intact. Gait not checked.  PSYCHIATRIC: The patient is alert and oriented x 3.  SKIN: No obvious rash, lesion, or ulcer.  DATA REVIEW:   CBC Recent Labs  Lab 01/05/19 0436  WBC 7.3  HGB 10.8*  HCT 32.5*  PLT 208    Chemistries  Recent Labs  Lab 01/03/19 0851   01/05/19 0436  NA 135   < > 133*  K 4.0   < > 3.9  CL 103   < > 102  CO2 25   < > 21*  GLUCOSE 139*   < > 170*  BUN 19   < > 14  CREATININE 0.95   < > 0.67  CALCIUM 8.6*   < > 8.3*  MG  --   --  1.6*  AST 22  --   --   ALT 19  --   --   ALKPHOS 48  --   --   BILITOT 1.2  --   --    < > = values in this interval not displayed.     Microbiology Results  Results for orders placed or performed during the hospital encounter of 01/03/19  Blood culture (routine x 2)     Status: None (Preliminary result)   Collection Time: 01/03/19  2:03 PM  Result Value Ref Range Status   Specimen Description BLOOD BLOOD RIGHT FOREARM  Final   Special Requests   Final    BOTTLES DRAWN AEROBIC AND ANAEROBIC Blood Culture adequate volume   Culture   Final    NO GROWTH 2 DAYS Performed at [facility], [address], [place], [place] [address]    Report Status PENDING  Incomplete  Blood culture (routine x 2)     Status: None (Preliminary result)   Collection Time: 01/03/19  2:03 PM  Result Value Ref Range Status   Specimen Description BLOOD RIGHT ANTECUBITAL  Final   Special Requests   Final    BOTTLES DRAWN AEROBIC AND ANAEROBIC Blood Culture adequate volume   Culture   Final    NO GROWTH 2 DAYS Performed at [facility], [address] [place], [place] [address]    Report Status PENDING  Incomplete  Surgical PCR screen     Status: None   Collection Time: 01/03/19  7:10 PM  Result Value Ref Range Status   MRSA, PCR NEGATIVE NEGATIVE Final   Staphylococcus aureus NEGATIVE NEGATIVE Final    Comment: (NOTE) The Xpert SA Assay (FDA approved for NASAL specimens in patients 10 years of age and older), is one component of a comprehensive surveillance program. It is not intended to diagnose infection nor to guide or monitor treatment. Performed at [facility], [address] [place], [place] [address]   Aerobic/Anaerobic Culture (surgical/deep wound)      Status: None (Preliminary result)   Collection Time: 01/04/19  6:50 PM  Result Value Ref Range Status   Specimen Description   Final    WOUND LEFT GREAT TOE Performed at [facility], [address], [place], [place] [address]    Special Requests   Final    NONE Performed at [facility], [place], [place] [address]    Gram Stain   Final    NO WBC SEEN RARE GRAM POSITIVE COCCI Performed at [facility], [place] [address] [place], [place] [address]    Culture PENDING  Incomplete   Report Status PENDING  Incomplete    RADIOLOGY:  No results found.   Management plans discussed with the patient, family and they are in agreement.  CODE STATUS: Full Code   TOTAL TIME TAKING CARE OF THIS PATIENT: 36 minutes.    Yesika Rispoli M.D on 01/05/2019 at 10:26 AM  Between 7am to 6pm - Pager - [phone]  After 6pm go to [email or web address] - Proofreader  [facility]  Office  [phone]  CC: Primary care physician; Arnetha Courser, MD   Note: This dictation was prepared with Dragon dictation along with smaller phrase technology. Any transcriptional errors that result from this process are unintentional.

## 2019-01-05 NOTE — Anesthesia Postprocedure Evaluation (Signed)
Anesthesia Post Note  Patient: Brian Dean  Procedure(s) Performed: AMPUTATION RAY LEFT GREAT TOE (Left First Toe)  Patient location during evaluation: PACU Anesthesia Type: General Level of consciousness: awake and alert Pain management: pain level controlled Vital Signs Assessment: post-procedure vital signs reviewed and stable Respiratory status: spontaneous breathing, nonlabored ventilation, respiratory function stable and patient connected to nasal cannula oxygen Cardiovascular status: blood pressure returned to baseline and stable Postop Assessment: no apparent nausea or vomiting Anesthetic complications: no     Last Vitals:  Vitals:   01/05/19 0400 01/05/19 0748  BP: (!) 153/71 (!) 144/87  Pulse: 63 60  Resp: 18 18  Temp: 36.7 C 36.5 C  SpO2: 98% 97%    Last Pain:  Vitals:   01/05/19 0807  TempSrc:   PainSc: 0-No pain                 Martha Clan

## 2019-01-05 NOTE — Progress Notes (Signed)
1 Day Post-Op   Subjective/Chief Complaint: Patient seen.  No complaints.   Objective: Vital signs in last 24 hours: Temp:  [97.6 F (36.4 C)-98 F (36.7 C)] 97.7 F (36.5 C) (02/27 0748) Pulse Rate:  [57-72] 60 (02/27 0748) Resp:  [14-18] 18 (02/27 0748) BP: (138-172)/(70-98) 144/87 (02/27 0748) SpO2:  [97 %-100 %] 97 % (02/27 0748) Last BM Date: (pta)  Intake/Output from previous day: 02/26 0701 - 02/27 0700 In: 1601.6 [P.O.:250; I.V.:850; IV Piggyback:501.6] Out: 1600 [Urine:1600] Intake/Output this shift: No intake/output data recorded.  The bandage is dry and intact.  No evidence of any strikethrough.  Lab Results:  Recent Labs    01/04/19 0338 01/05/19 0436  WBC 6.0 7.3  HGB 9.8* 10.8*  HCT 30.1* 32.5*  PLT 180 208   BMET Recent Labs    01/04/19 0338 01/05/19 0436  NA 140 133*  K 4.1 3.9  CL 109 102  CO2 25 21*  GLUCOSE 101* 170*  BUN 16 14  CREATININE 0.80 0.67  CALCIUM 8.5* 8.3*   PT/INR No results for input(s): LABPROT, INR in the last 72 hours. ABG No results for input(s): PHART, HCO3 in the last 72 hours.  Invalid input(s): PCO2, PO2  Studies/Results: Dg Ankle Complete Left  Result Date: 01/03/2019 CLINICAL DATA:  Swelling. Diabetic. EXAM: LEFT ANKLE COMPLETE - 3+ VIEW COMPARISON:  None. FINDINGS: Osseous structures of the LEFT ankle appear intact and normally aligned. Bone mineralization is normal. No degenerative change. Visualized structures of the hindfoot and midfoot appear intact and normally aligned. Adjacent soft tissues are unremarkable. IMPRESSION: Negative LEFT ankle. Electronically Signed   By: Franki Cabot M.D.   On: 01/03/2019 11:38   US Venous Img Lower Unilateral Left  Result Date: 01/03/2019 CLINICAL DATA:  Left lower extremity pain and edema. EXAM: LEFT LOWER EXTREMITY VENOUS DOPPLER ULTRASOUND TECHNIQUE: Gray-scale sonography with graded compression, as well as color Doppler and duplex ultrasound were performed to  evaluate the lower extremity deep venous systems from the level of the common femoral vein and including the common femoral, femoral, profunda femoral, popliteal and calf veins including the posterior tibial, peroneal and gastrocnemius veins when visible. The superficial great saphenous vein was also interrogated. Spectral Doppler was utilized to evaluate flow at rest and with distal augmentation maneuvers in the common femoral, femoral and popliteal veins. COMPARISON:  None. FINDINGS: Contralateral Common Femoral Vein: Respiratory phasicity is normal and symmetric with the symptomatic side. No evidence of thrombus. Normal compressibility. Common Femoral Vein: No evidence of thrombus. Normal compressibility, respiratory phasicity and response to augmentation. Saphenofemoral Junction: No evidence of thrombus. Normal compressibility and flow on color Doppler imaging. Profunda Femoral Vein: No evidence of thrombus. Normal compressibility and flow on color Doppler imaging. Femoral Vein: No evidence of thrombus. Normal compressibility, respiratory phasicity and response to augmentation. Popliteal Vein: No evidence of thrombus. Normal compressibility, respiratory phasicity and response to augmentation. Calf Veins: No evidence of thrombus. Normal compressibility and flow on color Doppler imaging. Superficial Great Saphenous Vein: No evidence of thrombus. Normal compressibility. Venous Reflux:  None. Other Findings: No evidence of superficial thrombophlebitis or abnormal fluid collection. Single mildly prominent left inguinal lymph node measures 1.5 cm in short axis. This may be a reactive node. IMPRESSION: No evidence of left lower extremity deep venous thrombosis. Electronically Signed   By: Aletta Edouard M.D.   On: 01/03/2019 12:52   Dg Foot Complete Left  Result Date: 01/03/2019 CLINICAL DATA:  Pt has onychomycosis of great toe nail.  History of open sore after pulling part of nail off in August of 2019, with  blisters. Now with soft tissue swelling. EXAM: LEFT FOOT - COMPLETE 3+ VIEW COMPARISON:  None. FINDINGS: There are advanced destructive changes of the distal phalanx of the great toe with associated fragmentation, presumed osteomyelitis. Associated soft tissue swelling/edema. No soft tissue gas seen. Remainder the osseous structures appear intact and normal in mineralization. Mild degenerative osteoarthritis at the first MTP joint. IMPRESSION: Advanced destructive changes of the distal phalanx of the great toe with associated fragmentation, presumed osteomyelitis. Associated soft tissue swelling/edema. Electronically Signed   By: Franki Cabot M.D.   On: 01/03/2019 11:37    Anti-infectives: Anti-infectives (From admission, onward)   Start     Dose/Rate Route Frequency Ordered Stop   01/04/19 0400  vancomycin (VANCOCIN) IVPB 1000 mg/200 mL premix     1,000 mg 200 mL/hr over 60 Minutes Intravenous Every 12 hours 01/03/19 1636     01/03/19 1630  piperacillin-tazobactam (ZOSYN) IVPB 3.375 g     3.375 g 12.5 mL/hr over 240 Minutes Intravenous Every 8 hours 01/03/19 1627     01/03/19 1545  vancomycin (VANCOCIN) 1,500 mg in sodium chloride 0.9 % 500 mL IVPB     1,500 mg 250 mL/hr over 120 Minutes Intravenous  Once 01/03/19 1539 01/03/19 1926   01/03/19 1545  ceFEPIme (MAXIPIME) 2 g in sodium chloride 0.9 % 100 mL IVPB  Status:  Discontinued     2 g 200 mL/hr over 30 Minutes Intravenous Every 8 hours 01/03/19 1540 01/03/19 1623      Assessment/Plan: s/p Procedure(s): AMPUTATION RAY LEFT GREAT TOE (Left) Assessment: Stable status post amputation left great toe.   Plan: Dressing left intact.  Patient should be stable for discharge today on oral antibiotics.  Would recommend Augmentin for 7 days.  Follow-up in 1 week outpatient.  Keep the bandage on the left foot dry and intact and do not remove.  Weightbearing as tolerated in his surgical shoe.  LOS: 2 days    Durward Fortes 01/05/2019

## 2019-01-05 NOTE — Progress Notes (Signed)
Discharge instructions given to pt. IV removed. Pt dressed. Post op shoe applied to left foot. Pt discharging home with wife.

## 2019-01-05 NOTE — Plan of Care (Signed)
  Problem: Clinical Measurements: Goal: Ability to avoid or minimize complications of infection will improve Outcome: Progressing   Problem: Skin Integrity: Goal: Skin integrity will improve Outcome: Progressing   Problem: Education: Goal: Knowledge of General Education information will improve Description Including pain rating scale, medication(s)/side effects and non-pharmacologic comfort measures Outcome: Progressing   Problem: Health Behavior/Discharge Planning: Goal: Ability to manage health-related needs will improve Outcome: Progressing   Problem: Clinical Measurements: Goal: Ability to maintain clinical measurements within normal limits will improve Outcome: Progressing Goal: Will remain free from infection Outcome: Progressing Goal: Diagnostic test results will improve Outcome: Progressing Goal: Respiratory complications will improve Outcome: Progressing Goal: Cardiovascular complication will be avoided Outcome: Progressing   Problem: Clinical Measurements: Goal: Will remain free from infection Outcome: Progressing   Problem: Clinical Measurements: Goal: Diagnostic test results will improve Outcome: Progressing   Problem: Clinical Measurements: Goal: Respiratory complications will improve Outcome: Progressing   Problem: Clinical Measurements: Goal: Cardiovascular complication will be avoided Outcome: Progressing   Problem: Activity: Goal: Risk for activity intolerance will decrease Outcome: Progressing   Problem: Nutrition: Goal: Adequate nutrition will be maintained Outcome: Progressing   Problem: Coping: Goal: Level of anxiety will decrease Outcome: Progressing   Problem: Elimination: Goal: Will not experience complications related to bowel motility Outcome: Progressing Goal: Will not experience complications related to urinary retention Outcome: Progressing   Problem: Pain Managment: Goal: General experience of comfort will  improve Outcome: Progressing   Problem: Safety: Goal: Ability to remain free from injury will improve Outcome: Progressing   Problem: Skin Integrity: Goal: Risk for impaired skin integrity will decrease Outcome: Progressing   Problem: Education: Goal: Knowledge of the prescribed therapeutic regimen will improve Outcome: Progressing Goal: Ability to verbalize activity precautions or restrictions will improve Outcome: Progressing Goal: Understanding of discharge needs will improve Outcome: Progressing   Problem: Activity: Goal: Ability to perform//tolerate increased activity and mobilize with assistive devices will improve Outcome: Progressing   Problem: Clinical Measurements: Goal: Postoperative complications will be avoided or minimized Outcome: Progressing   Problem: Self-Care: Goal: Ability to meet self-care needs will improve Outcome: Progressing   Problem: Self-Concept: Goal: Ability to maintain and perform role responsibilities to the fullest extent possible will improve Outcome: Progressing   Problem: Pain Management: Goal: Pain level will decrease with appropriate interventions Outcome: Progressing   Problem: Skin Integrity: Goal: Demonstration of wound healing without infection will improve Outcome: Progressing

## 2019-01-06 ENCOUNTER — Telehealth: Payer: Self-pay

## 2019-01-06 NOTE — Telephone Encounter (Signed)
Transition Care Management Follow-up Telephone Call  Date of discharge and from where: 01/05/19 [facility]  How have you been since you were released from the hospital? Pt states he is doing okay, mild pain but feeling well and trying to stay off of his foot. He is using a crutch to assist with ambulation. Fall prevention discussed.   Any questions or concerns? No   Items Reviewed:  Did the pt receive and understand the discharge instructions provided? Yes   Medications obtained and verified? Yes   Any new allergies since your discharge? No   Dietary orders reviewed? Yes  Do you have support at home? Yes   Functional Questionnaire: (I = Independent and D = Dependent) ADLs: I  Bathing/Dressing- I with some assistance   Meal Prep- I  Eating- I  Maintaining continence- I  Transferring/Ambulation- I  Managing Meds- I  Follow up appointments reviewed:   PCP Hospital f/u appt confirmed? Yes  Scheduled to see Dr. Sanda Klein on 01/09/19 @ 1:40.  [facility] f/u appt confirmed? Yes  Scheduled to see Sharlotte Alamo with podiarty on 01/11/19 @ 10:15.  Are transportation arrangements needed? No   If their condition worsens, is the pt aware to call PCP or go to the Emergency Dept.? Yes  Was the patient provided with contact information for the PCP's office or ED? Yes  Was to pt encouraged to call back with questions or concerns? Yes

## 2019-01-08 LAB — CULTURE, BLOOD (ROUTINE X 2)
Culture: NO GROWTH
Culture: NO GROWTH
SPECIAL REQUESTS: ADEQUATE
SPECIAL REQUESTS: ADEQUATE

## 2019-01-09 ENCOUNTER — Encounter: Payer: Self-pay | Admitting: Family Medicine

## 2019-01-09 ENCOUNTER — Ambulatory Visit: Payer: 59 | Admitting: Family Medicine

## 2019-01-09 VITALS — BP 128/74 | HR 61 | Temp 97.9°F | Resp 12 | Ht 72.0 in | Wt 171.2 lb

## 2019-01-09 DIAGNOSIS — D649 Anemia, unspecified: Secondary | ICD-10-CM | POA: Diagnosis not present

## 2019-01-09 DIAGNOSIS — Z09 Encounter for follow-up examination after completed treatment for conditions other than malignant neoplasm: Secondary | ICD-10-CM

## 2019-01-09 DIAGNOSIS — E1142 Type 2 diabetes mellitus with diabetic polyneuropathy: Secondary | ICD-10-CM

## 2019-01-09 DIAGNOSIS — K625 Hemorrhage of anus and rectum: Secondary | ICD-10-CM

## 2019-01-09 DIAGNOSIS — K648 Other hemorrhoids: Secondary | ICD-10-CM

## 2019-01-09 LAB — SURGICAL PATHOLOGY

## 2019-01-09 LAB — AEROBIC/ANAEROBIC CULTURE W GRAM STAIN (SURGICAL/DEEP WOUND): Gram Stain: NONE SEEN

## 2019-01-09 NOTE — Assessment & Plan Note (Addendum)
Years ago, hemorrhoids are back; stool cards given; check CBC and ferritin; opted to refer to Dr. Marius Ditch

## 2019-01-09 NOTE — Assessment & Plan Note (Signed)
Control of blood sugars important

## 2019-01-09 NOTE — Addendum Note (Signed)
Addendum  created 01/09/19 1346 by Doreen Salvage, CRNA   Charge Capture section accepted

## 2019-01-09 NOTE — Patient Instructions (Addendum)
Please do see your eye doctor regularly, and have your eyes examined every year (or more often per his or her recommendation) Check your feet every night and let me know right away of any sores, infections, numbness, etc. Try to limit sweets, white bread, white rice, white potatoes It is okay with me for you to not check your fingerstick blood sugars unless you are interested and feel it would be helpful for you  Follow-up with Dr. Cleda Mccreedy  Keep a really close eye on your feet and let Dr. Cleda Mccreedy know about any changes that may be a sign of infection right away  Return the stool cards at your earliest convenience

## 2019-01-09 NOTE — Progress Notes (Signed)
BP 128/74   Pulse 61   Temp 97.9 F (36.6 C) (Oral)   Resp 12   Ht 6' (1.829 m)   Wt 171 lb 3.2 oz (77.7 kg)   SpO2 97%   BMI 23.22 kg/m    Subjective:    Patient ID: Brian Dean, male    DOB: Sep 29, 1956, 63 y.o.   MRN: [id number]  HPI: Brian Dean is a 63 y.o. male  Chief Complaint  Patient presents with  . 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 GREAT TOE, LEFT DISTAL; PARTIAL AMPUTATION:  - SKIN AND SOFT TISSUE WITH CHRONIC ULCERATION AND UNDERLYING  OSTEOMYELITIS.  - VIABLE SOFT TISSUE AT THE MARGIN.  - BONE MARGIN IS NEGATIVE FOR OSTEOMYELITIS.  Anemia; not feeling tired No nose bleeds, no gum bleeding No blood in the urine or stool  Goes back to see Dr. Cleda Mccreedy on Wednesday  Glucose checked frequently; last was 0747 hours on day of discharge, 150 and premeal; highest during stay; not checking at home;  Lab Results  Component Value Date   HGBA1C 5.6 10/28/2018   Magnesium was 1.6, one-tenth of a point below normal They gave him a bag of magnesium No symptoms like twitching or muscle cramping   Depression screen [facility] 2/9 01/09/2019 01/03/2019 10/28/2018 10/28/2018 04/27/2018  Decreased Interest 0 0 0 0 0  Down, Depressed, Hopeless 0 0 0 0 0  PHQ - 2 Score 0 0 0 0 0  Altered sleeping 0 0 0 - -  Tired, decreased energy 0 0 0 - -  Change in appetite 0 0 0 - -  Feeling bad or failure about yourself  0 0 0 - -  Trouble concentrating 0 0 0 - -  Moving slowly or fidgety/restless 0 0 0 - -  Suicidal thoughts 0 0 0 - -  PHQ-9 Score 0 0 0 - -  Difficult doing work/chores Not difficult at all Not  difficult at all Not difficult at all - -   Fall Risk  01/09/2019 01/03/2019 10/28/2018 04/27/2018 10/27/2017  Falls in the past year? 0 0 0 No No  Number falls in past yr: 0 0 0 - -  Injury with Fall? 0 0 0 - -  Follow up - Falls evaluation completed - - -    Relevant past medical, surgical, family and social history reviewed Past Medical History:  Diagnosis Date  . Arthritis   . CAD (coronary artery disease) Nov 2014   diffuse, aggresive risk factor modification recommended  . Diabetes mellitus without complication ([place])   . Diabetic neuropathy ([place])   . Dyslipidemia   . ED (erectile dysfunction)   . GERD (gastroesophageal reflux disease)   . History of acute pyelonephritis Aug. 2014   hospitalized; 100k E. coli pansensitive  . Hypercholesteremia   . Hyperlipidemia   . Hypertension   . Hypertensive heart disease   . Inguinal hernia   . Osteomyelitis ([place])   . Sciatica   . Syncope and collapse   . Tobacco use   . Unspecified systolic (congestive) heart failure [facility])    Past Surgical  History:  Procedure Laterality Date  . AMPUTATION Left 01/04/2019   Procedure: AMPUTATION RAY LEFT GREAT TOE;  Surgeon: Sharlotte Alamo, DPM;  Location: [facility] ORS;  Service: Podiatry;  Laterality: Left;  . CARDIAC CATHETERIZATION  11/14   [facility] x1  . [facility]  [facility]  . COLONOSCOPY WITH PROPOFOL N/A 06/22/2016   Procedure: COLONOSCOPY WITH PROPOFOL;  Surgeon: Robert Bellow, MD;  Location: [facility] ENDOSCOPY;  Service: Endoscopy;  Laterality: N/A;  . ELBOW ARTHROSCOPY  2001   lt  . GUM SURGERY    . OLECRANON BURSECTOMY  11/12/2011   Procedure: OLECRANON BURSA;  Surgeon: Linna Hoff;  Location: [place];  Service: Orthopedics;  Laterality: Right;  olecracnon bursectomy with delayed closure  . WISDOM TOOTH EXTRACTION     Family History  Problem Relation Age of Onset  . Hypertension Father   . Diabetes Father   . Heart disease Father   . Diabetes Mother   . Heart disease  Mother   . Hypertension Mother   . Cancer Mother        bladder  . Diabetes Sister   . Stroke Sister        mini stroke  . Diabetes Brother   . Diabetes Sister   . Diabetes Sister   . Diabetes Sister   . Diabetes Sister    Social History   Tobacco Use  . Smoking status: Current Every Day Smoker    Packs/day: 0.50    Years: 47.00    Pack years: 23.50    Types: Cigarettes    Last attempt to quit: 11/09/2012    Years since quitting: 6.2  . Smokeless tobacco: Never Used  Substance Use Topics  . Alcohol use: Yes    Alcohol/week: 0.0 standard drinks    Comment: occasional  . Drug use: No     [facility] Visit from 01/09/2019 in [facility]  AUDIT-C Score  1      Interim medical history since last visit reviewed. Allergies and medications reviewed  Review of Systems Per HPI unless specifically indicated above     Objective:    BP 128/74   Pulse 61   Temp 97.9 F (36.6 C) (Oral)   Resp 12   Ht 6' (1.829 m)   Wt 171 lb 3.2 oz (77.7 kg)   SpO2 97%   BMI 23.22 kg/m   Wt Readings from Last 3 Encounters:  01/09/19 171 lb 3.2 oz (77.7 kg)  01/03/19 165 lb 1.6 oz (74.9 kg)  01/03/19 165 lb 1.6 oz (74.9 kg)    Physical Exam Constitutional:      General: He is not in acute distress.    Appearance: He is well-developed.  Eyes:     General: No scleral icterus. Cardiovascular:     Rate and Rhythm: Normal rate and regular rhythm.  Pulmonary:     Effort: Pulmonary effort is normal.     Breath sounds: Normal breath sounds.  Musculoskeletal:     Left foot: Normal range of motion.  Feet:     Right foot:     Protective Sensation: 5 sites tested. 5 sites sensed.     Left foot:     Protective Sensation: 0 sites tested. 0 sites sensed.     Comments: Left foot wrapped, in a boot Skin:    Coloration: Skin is not pale.  Neurological:     Mental Status: He is alert.     Results for orders  placed or performed in visit on 99/35/70  BASIC METABOLIC PANEL  WITH GFR  Result Value Ref Range   Glucose, Bld 61 (L) 65 - 99 mg/dL   BUN 16 7 - 25 mg/dL   Creat 0.99 0.70 - 1.25 mg/dL   GFR, Est Non African American 81 > OR = 60 mL/min/1.90m2   GFR, Est African American 94 > OR = 60 mL/min/1.4m2   BUN/Creatinine Ratio NOT APPLICABLE 6 - 22 (calc)   Sodium 138 135 - 146 mmol/L   Potassium 4.1 3.5 - 5.3 mmol/L   Chloride 103 98 - 110 mmol/L   CO2 27 20 - 32 mmol/L   Calcium 9.3 8.6 - 10.3 mg/dL  CBC with Differential/Platelet  Result Value Ref Range   WBC 9.2 3.8 - 10.8 Thousand/uL   RBC 3.78 (L) 4.20 - 5.80 Million/uL   Hemoglobin 11.1 (L) 13.2 - 17.1 g/dL   HCT 33.5 (L) 38.5 - 50.0 %   MCV 88.6 80.0 - 100.0 fL   MCH 29.4 27.0 - 33.0 pg   MCHC 33.1 32.0 - 36.0 g/dL   RDW 12.2 11.0 - 15.0 %   Platelets 287 140 - 400 Thousand/uL   MPV 9.8 7.5 - 12.5 fL   Neutro Abs 6,210 1,500 - 7,800 cells/uL   Lymphs Abs 2,190 850 - 3,900 cells/uL   Absolute Monocytes 570 200 - 950 cells/uL   Eosinophils Absolute 156 15 - 500 cells/uL   Basophils Absolute 74 0 - 200 cells/uL   Neutrophils Relative % 67.5 %   Total Lymphocyte 23.8 %   Monocytes Relative 6.2 %   Eosinophils Relative 1.7 %   Basophils Relative 0.8 %  Phosphorus  Result Value Ref Range   Phosphorus 3.7 2.5 - 4.5 mg/dL  VITAMIN D 25 Hydroxy (Vit-D Deficiency, Fractures)  Result Value Ref Range   Vit D, 25-Hydroxy 15 (L) 30 - 100 ng/mL  Magnesium  Result Value Ref Range   Magnesium 1.7 1.5 - 2.5 mg/dL  Iron, TIBC and Ferritin Panel  Result Value Ref Range   Iron 38 (L) 50 - 180 mcg/dL   TIBC 357 250 - 425 mcg/dL (calc)   %SAT 11 (L) 20 - 48 % (calc)   Ferritin 30 24 - 380 ng/mL      Assessment & Plan:   Problem List Items Addressed This Visit      Cardiovascular and Mediastinum   Internal hemorrhoids    Noted on previous colonoscopy; patient now having bleeding again; stool cards given; after visit, decided to refer to Dr. Marius Ditch      Relevant Orders   Ambulatory referral  to Gastroenterology     Digestive   Rectal bleeding    Years ago, hemorrhoids are back; stool cards given; check CBC and ferritin; opted to refer to Dr. Marius Ditch      Relevant Orders   Ambulatory referral to Gastroenterology     Endocrine   Diabetic neuropathy ([place]) (Chronic)    Control of blood sugars important       Other Visit Diagnoses    Hospital discharge follow-up    -  Primary   Relevant Orders   Iron, TIBC and Ferritin Panel (Completed)   Hypomagnesemia       Relevant Orders   Magnesium (Completed)   Hypocalcemia       Relevant Orders   BASIC METABOLIC PANEL WITH GFR (Completed)   Phosphorus (Completed)   VITAMIN D 25 Hydroxy (Vit-D Deficiency, Fractures) (Completed)   Anemia,  unspecified type       Relevant Orders   CBC with Differential/Platelet (Completed)   Ambulatory referral to Gastroenterology       Follow up plan: No follow-ups on file.  An after-visit summary was printed and given to the patient at [place].  Please see the patient instructions which may contain other information and recommendations beyond what is mentioned above in the assessment and plan.  No orders of the defined types were placed in this encounter.   Orders Placed This Encounter  Procedures  . BASIC METABOLIC PANEL WITH GFR  . CBC with Differential/Platelet  . Phosphorus  . VITAMIN D 25 Hydroxy (Vit-D Deficiency, Fractures)  . Magnesium  . Iron, TIBC and Ferritin Panel  . Ambulatory referral to Gastroenterology

## 2019-01-10 ENCOUNTER — Other Ambulatory Visit: Payer: Self-pay | Admitting: Family Medicine

## 2019-01-10 DIAGNOSIS — D649 Anemia, unspecified: Secondary | ICD-10-CM

## 2019-01-10 LAB — CBC WITH DIFFERENTIAL/PLATELET
Absolute Monocytes: 570 cells/uL (ref 200–950)
Basophils Absolute: 74 cells/uL (ref 0–200)
Basophils Relative: 0.8 %
Eosinophils Absolute: 156 cells/uL (ref 15–500)
Eosinophils Relative: 1.7 %
HCT: 33.5 % — ABNORMAL LOW (ref 38.5–50.0)
Hemoglobin: 11.1 g/dL — ABNORMAL LOW (ref 13.2–17.1)
Lymphs Abs: 2190 cells/uL (ref 850–3900)
MCH: 29.4 pg (ref 27.0–33.0)
MCHC: 33.1 g/dL (ref 32.0–36.0)
MCV: 88.6 fL (ref 80.0–100.0)
MPV: 9.8 fL (ref 7.5–12.5)
Monocytes Relative: 6.2 %
Neutro Abs: 6210 cells/uL (ref 1500–7800)
Neutrophils Relative %: 67.5 %
Platelets: 287 10*3/uL (ref 140–400)
RBC: 3.78 10*6/uL — ABNORMAL LOW (ref 4.20–5.80)
RDW: 12.2 % (ref 11.0–15.0)
Total Lymphocyte: 23.8 %
WBC: 9.2 10*3/uL (ref 3.8–10.8)

## 2019-01-10 LAB — BASIC METABOLIC PANEL WITH GFR
BUN: 16 mg/dL (ref 7–25)
CO2: 27 mmol/L (ref 20–32)
Calcium: 9.3 mg/dL (ref 8.6–10.3)
Chloride: 103 mmol/L (ref 98–110)
Creat: 0.99 mg/dL (ref 0.70–1.25)
GFR, Est African American: 94 mL/min/{1.73_m2} (ref >=60)
GFR, Est Non African American: 81 mL/min/{1.73_m2} (ref >=60)
Glucose, Bld: 61 mg/dL — ABNORMAL LOW (ref 65–99)
Potassium: 4.1 mmol/L (ref 3.5–5.3)
Sodium: 138 mmol/L (ref 135–146)

## 2019-01-10 LAB — MAGNESIUM: MAGNESIUM: 1.7 mg/dL (ref 1.5–2.5)

## 2019-01-10 LAB — VITAMIN D 25 HYDROXY (VIT D DEFICIENCY, FRACTURES): VIT D 25 HYDROXY: 15 ng/mL — AB (ref 30–100)

## 2019-01-10 LAB — IRON,TIBC AND FERRITIN PANEL
%SAT: 11 % (calc) — ABNORMAL LOW (ref 20–48)
Ferritin: 30 ng/mL (ref 24–380)
Iron: 38 ug/dL — ABNORMAL LOW (ref 50–180)
TIBC: 357 mcg/dL (calc) (ref 250–425)

## 2019-01-10 LAB — PHOSPHORUS: Phosphorus: 3.7 mg/dL (ref 2.5–4.5)

## 2019-01-10 MED ORDER — VITAMIN D (ERGOCALCIFEROL) 1.25 MG (50000 UNIT) PO CAPS: 50000.0000 [IU] | ORAL_CAPSULE | ORAL | 1 refills | Status: DC

## 2019-01-10 NOTE — Progress Notes (Signed)
Vit D Rx Recheck CBC in 1 month

## 2019-01-14 ENCOUNTER — Other Ambulatory Visit: Payer: Self-pay | Admitting: Family Medicine

## 2019-01-14 NOTE — Telephone Encounter (Signed)
Refill request received for 20 mg lisinopril Not appropriate, 30 mg was just written in Jan 2020  Lab Results  Component Value Date   CREATININE 0.99 01/09/2019   Lab Results  Component Value Date   K 4.1 01/09/2019

## 2019-01-20 ENCOUNTER — Telehealth: Payer: Self-pay | Admitting: Family Medicine

## 2019-01-20 NOTE — Telephone Encounter (Signed)
Please let patient that I've made a referral for him to see a hemorrhoid specialist, Dr. Marius Ditch He has internal hemorrhoids on his labs colonoscopy, has had them banded, and will get him in with specialist now that they're bleeding again

## 2019-01-20 NOTE — Telephone Encounter (Signed)
Pt.notified

## 2019-01-20 NOTE — Assessment & Plan Note (Signed)
Noted on previous colonoscopy; patient now having bleeding again; stool cards given; after visit, decided to refer to Dr. Marius Ditch

## 2019-01-30 ENCOUNTER — Other Ambulatory Visit: Payer: Self-pay | Admitting: Family Medicine

## 2019-02-03 ENCOUNTER — Ambulatory Visit: Payer: 59 | Admitting: Gastroenterology

## 2019-02-14 ENCOUNTER — Other Ambulatory Visit: Payer: Self-pay

## 2019-02-14 MED ORDER — PANTOPRAZOLE SODIUM 40 MG PO TBEC: 40.0000 mg | DELAYED_RELEASE_TABLET | Freq: Every day | ORAL | 0 refills | Status: DC

## 2019-02-14 NOTE — Telephone Encounter (Signed)
Requested Prescriptions   Signed Prescriptions Disp Refills  . pantoprazole (PROTONIX) 40 MG tablet 90 tablet 0    Sig: Take 1 tablet (40 mg total) by mouth daily.    Authorizing Provider: Minna Merritts    Ordering User: Janan Ridge

## 2019-03-03 ENCOUNTER — Other Ambulatory Visit: Payer: Self-pay

## 2019-03-03 ENCOUNTER — Telehealth: Payer: Self-pay | Admitting: Gastroenterology

## 2019-03-03 ENCOUNTER — Ambulatory Visit (INDEPENDENT_AMBULATORY_CARE_PROVIDER_SITE_OTHER): Payer: 59 | Admitting: Gastroenterology

## 2019-03-03 DIAGNOSIS — R58 Hemorrhage, not elsewhere classified: Secondary | ICD-10-CM | POA: Diagnosis not present

## 2019-03-03 DIAGNOSIS — D500 Iron deficiency anemia secondary to blood loss (chronic): Secondary | ICD-10-CM | POA: Diagnosis not present

## 2019-03-03 DIAGNOSIS — K625 Hemorrhage of anus and rectum: Secondary | ICD-10-CM

## 2019-03-03 MED ORDER — FUSION PLUS PO CAPS: 1.0000 | ORAL_CAPSULE | Freq: Every day | ORAL | 1 refills | Status: AC

## 2019-03-03 NOTE — Progress Notes (Signed)
Sherri Sear, MD [address]  [place]  [place], [place] [address]  Main: [phone]  Fax: [phone]    Gastroenterology Consultation Video Visit  Referring Provider:     Arnetha Courser, MD Primary Care Physician:  Arnetha Courser, MD Primary Gastroenterologist:  Dr. Cephas Darby Reason for Consultation:     Rectal bleeding        HPI:   Brian Dean is a 63 y.o. male Dean by Dr. Arnetha Courser, MD  for consultation & management of rectal bleeding  Virtual Visit Video Note  I connected with Brian Dean on 03/03/19 at 11:00 AM EDT by video and verified that I am speaking with the correct person using two identifiers.   I discussed the limitations, risks, security and privacy concerns of performing an evaluation and management service by video and the availability of in person appointments. I also discussed with the patient that there may be a patient responsible charge related to this service. The patient expressed understanding and agreed to proceed.  Location of the Patient: Home  Location of the provider: Home office   History of Present Illness:   Brian Dean for management of rectal bleeding.  He has history of internal hemorrhoids, underwent hemorrhoid ligation during colonoscopy by Dr. Bary Castilla in 2017 for same complaint.  Patient reports that he has been noticing bright red blood per rectum on wiping only, it is occasional, occurs about once or twice a month.  He denies any other GI symptoms.  He denies constipation.  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  He is not taking iron supplementation.  He denies black stools, abdominal pain.  NSAIDs: None  Antiplts/Anticoagulants/Anti thrombotics: Aspirin 81 and aspirin 81+ calcium  GI Procedures:  Colonoscopy 06/22/2016 - Eight benign appearing 5 mm  polyps in the rectum and at the recto-sigmoid colon. Biopsied. - Internal hemorrhoids. Banded.  Past Medical History:  Diagnosis Date  . Arthritis   . CAD (coronary artery disease) Nov 2014   diffuse, aggresive risk factor modification recommended  . Diabetes mellitus without complication ([place])   . Diabetic neuropathy ([place])   . Dyslipidemia   . ED (erectile dysfunction)   . GERD (gastroesophageal reflux disease)   . History of acute pyelonephritis Aug. 2014   hospitalized; 100k E. coli pansensitive  . Hypercholesteremia   . Hyperlipidemia   . Hypertension   . Hypertensive heart disease   . Inguinal hernia   . Osteomyelitis ([place])   . Sciatica   . Syncope and collapse   . Tobacco use   . Unspecified systolic (congestive) heart failure [facility])     Past Surgical History:  Procedure Laterality Date  . AMPUTATION Left 01/04/2019   Procedure: AMPUTATION RAY LEFT GREAT TOE;  Surgeon: Sharlotte Alamo, DPM;  Location: [facility] ORS;  Service: Podiatry;  Laterality: Left;  . CARDIAC CATHETERIZATION  11/14   [facility] x1  . [facility]  [facility]  . COLONOSCOPY WITH PROPOFOL N/A 06/22/2016   Procedure: COLONOSCOPY WITH PROPOFOL;  Surgeon: Robert Bellow, MD;  [facility]COPY;  Service: Endoscopy;  Laterality: N/A;  . ELBOW ARTHROSCOPY  2001   lt  . GUM SURGERY    . OLECRANON BURSECTOMY  11/12/2011   Procedure: OLECRANON BURSA;  Surgeon: Linna Hoff;  Location: [place];  Service: Orthopedics;  Laterality: Right;  olecracnon bursectomy with delayed closure  . WISDOM TOOTH EXTRACTION      Current Outpatient Medications:  .  aspirin 81 MG tablet, Take 81 mg by mouth daily. Take 1 tablet (81 mg) by mouth once daily, Disp: , Rfl:  .  Aspirin-Calcium Carbonate 81-777 MG TABS, Take by mouth., Disp: , Rfl:  .  atorvastatin (LIPITOR) 20 MG tablet, Take 1 tablet (20 mg total) by mouth daily., Disp: 90 tablet, Rfl: 3 .  Cinnamon Bark POWD, Take by mouth., Disp: , Rfl:  .   CINNAMON PO, Take 1,000 mg by mouth 2 (two) times daily. Reported on 05/06/2016, Disp: , Rfl:  .  finasteride (PROSCAR) 5 MG tablet, Take 1 tablet (5 mg total) by mouth daily., Disp: 90 tablet, Rfl: 3 .  gabapentin (NEURONTIN) 300 MG capsule, TAKE 1 CAPSULE BY MOUTH EVERYDAY AT BEDTIME, Disp: 90 capsule, Rfl: 3 .  HYDROcodone-acetaminophen (NORCO/VICODIN) 5-325 MG tablet, Take 1 tablet by mouth every 6 (six) hours as needed for moderate pain or severe pain., Disp: 14 tablet, Rfl: 0 .  lisinopril (PRINIVIL,ZESTRIL) 30 MG tablet, Take 1 tablet (30 mg total) by mouth daily., Disp: 90 tablet, Rfl: 3 .  metFORMIN (GLUCOPHAGE) 500 MG tablet, Take 1 tablet (500 mg total) by mouth 2 (two) times daily with a meal., Disp: 180 tablet, Rfl: 1 .  nitroGLYCERIN (NITROSTAT) 0.4 MG SL tablet, Place 1 tablet (0.4 mg total) under the tongue every 5 (five) minutes as needed., Disp: 25 tablet, Rfl: 3 .  pantoprazole (PROTONIX) 40 MG tablet, Take 1 tablet (40 mg total) by mouth daily., Disp: 90 tablet, Rfl: 0 .  pyridOXINE (VITAMIN B-6) 100 MG tablet, Take 100 mg by mouth daily. Reported on 05/06/2016, Disp: , Rfl:  .  sulfamethoxazole-trimethoprim (BACTRIM DS) 800-160 MG tablet, Take 1 tablet by mouth 2 (two) times daily., Disp: , Rfl:  .  tadalafil (CIALIS) 5 MG tablet, Take 5 mg by mouth daily. Reported on 05/06/2016, Disp: , Rfl:  .  Vitamin D, Ergocalciferol, (DRISDOL) 1.25 MG (50000 UT) CAPS capsule, Take 1 capsule (50,000 Units total) by mouth every 7 (seven) days., Disp: 4 capsule, Rfl: 1 .  Iron-FA-B Cmp-C-Biot-Probiotic (FUSION PLUS) CAPS, Take 1 capsule by mouth daily for 30 days., Disp: 30 capsule, Rfl: 1   Family History  Problem Relation Age of Onset  . Hypertension Father   . Diabetes Father   . Heart disease Father   . Diabetes Mother   . Heart disease Mother   . Hypertension Mother   . Cancer Mother        bladder  . Diabetes Sister   . Stroke Sister        mini stroke  . Diabetes Brother   .  Diabetes Sister   . Diabetes Sister   . Diabetes Sister   . Diabetes Sister      Social History   Tobacco Use  . Smoking status: Current Every Day Smoker    Packs/day: 0.50    Years: 47.00    Pack years: 23.50    Types: Cigarettes    Last attempt to quit: 11/09/2012    Years since quitting: 6.3  . Smokeless tobacco: Never Used  Substance Use Topics  . Alcohol use: Yes    Alcohol/week: 0.0 standard drinks    Comment: occasional  . Drug use: No    Allergies as of 03/03/2019  . (No Known Allergies)     Imaging Studies: Reviewed  Assessment and Plan:   ALLISTER LESSLEY is a 63 y.o. male with history of internal hemorrhoids status post banding, with intermittent rectal bleeding on wiping only, also has new diagnosis of iron deficiency anemia  Rectal bleeding from internal hemorrhoids Do not recommend hemorrhoid ligation at this time as bleeding is very minimal and without any other symptoms Advised him to try over-the-counter hemorrhoid cream for 2 weeks  Iron deficiency anemia Patient takes baby aspirin aspirin with calcium daily Is also taking Protonix daily Recommend fusion plus Recheck CBC, iron studies, B12 and folate levels    Follow Up Instructions:   I discussed the assessment and treatment plan with the patient. The patient was provided an opportunity to ask questions and all were answered. The patient agreed with the plan and demonstrated an understanding of the instructions.   The patient was advised to call back or seek an in-person evaluation if the symptoms worsen or if the condition fails to improve as anticipated.  I provided 20 minutes of face-to-face time during this encounter.   Follow up in 3 months   Cephas Darby, MD

## 2019-03-03 NOTE — Telephone Encounter (Signed)
LVM to make a f/u appt in 3 mths w/ Dr. Marius Ditch around 06/02/2019

## 2019-03-09 ENCOUNTER — Other Ambulatory Visit: Payer: Self-pay

## 2019-03-09 DIAGNOSIS — D500 Iron deficiency anemia secondary to blood loss (chronic): Secondary | ICD-10-CM

## 2019-03-12 ENCOUNTER — Other Ambulatory Visit: Payer: Self-pay | Admitting: Family Medicine

## 2019-03-22 ENCOUNTER — Ambulatory Visit: Payer: 59 | Admitting: Gastroenterology

## 2019-05-01 ENCOUNTER — Ambulatory Visit: Payer: 59 | Admitting: Family Medicine

## 2019-05-05 ENCOUNTER — Encounter: Payer: Self-pay | Admitting: Family Medicine

## 2019-05-11 ENCOUNTER — Encounter: Payer: Self-pay | Admitting: Family Medicine

## 2019-05-11 ENCOUNTER — Ambulatory Visit: Payer: 59 | Admitting: Family Medicine

## 2019-05-11 ENCOUNTER — Other Ambulatory Visit: Payer: Self-pay

## 2019-05-11 VITALS — BP 120/76 | HR 85 | Temp 97.9°F | Resp 18 | Ht 72.0 in | Wt 163.6 lb

## 2019-05-11 DIAGNOSIS — I119 Hypertensive heart disease without heart failure: Secondary | ICD-10-CM

## 2019-05-11 DIAGNOSIS — K625 Hemorrhage of anus and rectum: Secondary | ICD-10-CM

## 2019-05-11 DIAGNOSIS — E875 Hyperkalemia: Secondary | ICD-10-CM

## 2019-05-11 DIAGNOSIS — Z89412 Acquired absence of left great toe: Secondary | ICD-10-CM | POA: Diagnosis not present

## 2019-05-11 DIAGNOSIS — K219 Gastro-esophageal reflux disease without esophagitis: Secondary | ICD-10-CM

## 2019-05-11 DIAGNOSIS — E782 Mixed hyperlipidemia: Secondary | ICD-10-CM

## 2019-05-11 DIAGNOSIS — I5022 Chronic systolic (congestive) heart failure: Secondary | ICD-10-CM | POA: Diagnosis not present

## 2019-05-11 DIAGNOSIS — E1142 Type 2 diabetes mellitus with diabetic polyneuropathy: Secondary | ICD-10-CM | POA: Diagnosis not present

## 2019-05-11 DIAGNOSIS — I2510 Atherosclerotic heart disease of native coronary artery without angina pectoris: Secondary | ICD-10-CM

## 2019-05-11 DIAGNOSIS — N529 Male erectile dysfunction, unspecified: Secondary | ICD-10-CM

## 2019-05-11 DIAGNOSIS — E11628 Type 2 diabetes mellitus with other skin complications: Secondary | ICD-10-CM | POA: Diagnosis not present

## 2019-05-11 NOTE — Progress Notes (Signed)
Name: Brian Dean   MRN: [id number]    DOB: 24-Feb-1956   Date:05/11/2019       Progress Note  Subjective  Chief Complaint  Chief Complaint  Patient presents with  . Follow-up    6 moth recheck  . Diabetes  . Anemia    Dr. Marius Ditch gave medication has questions    HPI  LEFT Great Toe Amputation in February 2020 after acute osteomyelitis by Dr. Cleda Mccreedy.  He is doing well, balance has been fine.  No swelling or infection, no phantom pain, balance has been good.   Diabetes mellitus type 2 Checking sugars?  no Trying to limit white bread, white rice, white potatoes, sweets?  Eats what he wants Trying to limit sweetened drinks like iced tea, soft drinks, sports drinks, fruit juices?  no Checking feet every day/night?  yes Last eye exam:  UTD Denies: Polyuria, polydipsia, polyphagia, vision changes; has bilateral foot neuropathy - no pain, just numbness.  Taking gabapentin nightly. Most recent A1C:  Lab Results  Component Value Date   HGBA1C 5.6 10/28/2018    We will recheck today. Last CMP Results : is due for repeat today    Component Value Date/Time   NA 138 01/09/2019 0000   NA 138 10/31/2015 0924   NA 137 09/16/2013 0333   K 4.1 01/09/2019 0000   K 4.2 09/16/2013 0333   CL 103 01/09/2019 0000   CL 106 09/16/2013 0333   CO2 27 01/09/2019 0000   CO2 27 09/16/2013 0333   GLUCOSE 61 (L) 01/09/2019 0000   GLUCOSE 148 (H) 09/16/2013 0333   BUN 16 01/09/2019 0000   BUN 10 10/31/2015 0924   BUN 11 09/16/2013 0333   CREATININE 0.99 01/09/2019 0000   CALCIUM 9.3 01/09/2019 0000   CALCIUM 9.0 09/16/2013 0333   PROT 7.1 01/03/2019 0851   PROT 7.4 10/31/2015 0924   PROT 6.4 07/08/2013 0223   ALBUMIN 3.7 01/03/2019 0851   ALBUMIN 4.6 10/31/2015 0924   ALBUMIN 2.7 (L) 07/08/2013 0223   AST 22 01/03/2019 0851   AST 22 07/08/2013 0223   ALT 19 01/03/2019 0851   ALT 34 07/08/2013 0223   ALKPHOS 48 01/03/2019 0851   ALKPHOS 62 07/08/2013 0223   BILITOT 1.2 01/03/2019 0851    BILITOT 0.7 10/31/2015 0924   BILITOT 0.9 07/08/2013 0223   GFRNONAA 81 01/09/2019 0000   GFRAA 94 01/09/2019 0000  Urine Micro UTD? No Current Medication Management: Diabetic Medications: Metformin daily. ACEI/ARB: Yes Statin: Yes Aspirin therapy: Yes  HLD: Taking statin therapy - no chest pain, shortness of breath, or myalgias  Sciatica: Taking gabapentin nightly and he has been very well controlled.  HTN/CHF/CAD: Taking lisinopril 30mg  and doing well.  No chest pain, shortness of breath, no BLE edema or headaches.  Not avoiding salt but does not add any extra at the table.  BP at goal today.  He had an MI in 2013 - seeing Dr. Rockey Situ annually.  Has not needed his nitro.  On statin, ACE-I daily.  BPH: Taking cialis 5mg  daily, proscar 5mg .  No history of prostate cancer.  Had "cool thermo" procedure done.  No issues with urination or ED since procedure. Seeing Dr. Rogers Blocker annually for PSA checks.    IDA, Chronic Blood Loss from Hemorrhoids: Seeing Dr. Marius Ditch, who has him on an iron supplement. She did not recommend banding then due to minimal bleeding. No abdominal or rectal pain.   Patient Active Problem List  Diagnosis Date Noted  . Onychomycosis of left great toe 01/03/2019  . Pain and swelling of left lower extremity 01/03/2019  . Osteomyelitis of left foot ([place]) 01/03/2019  . Sciatica 10/27/2017  . Rectal bleeding 05/07/2016  . Internal hemorrhoids 05/07/2016  . Hyperkalemia 12/29/2015  . Medication monitoring encounter 10/25/2015  . Hyperlipidemia   . GERD (gastroesophageal reflux disease)   . Hypertensive heart disease   . Unspecified systolic (congestive) heart failure ([place])   . ED (erectile dysfunction)   . Diabetic neuropathy ([place])   . S/P coronary artery stent placement 09/25/2013  . CAD (coronary artery disease) 07/28/2013  . Diabetes mellitus type II, controlled ([place]) 07/28/2013  . Abnormal EKG 07/24/2013  . Preoperative cardiovascular examination 07/24/2013  .  Smoker 07/24/2013  . Gallstone 07/17/2013  . Inguinal hernia 07/17/2013   Past Surgical History:  Procedure Laterality Date  . AMPUTATION Left 01/04/2019   Procedure: AMPUTATION RAY LEFT GREAT TOE;  Surgeon: Sharlotte Alamo, DPM;  Location: [facility] ORS;  Service: Podiatry;  Laterality: Left;  . CARDIAC CATHETERIZATION  11/14   [facility] x1  . COLONOSCOPY  2004   [facility]  . COLONOSCOPY WITH PROPOFOL N/A 06/22/2016   Procedure: COLONOSCOPY WITH PROPOFOL;  Surgeon: Robert Bellow, MD;  Location: [facility] ENDOSCOPY;  Service: Endoscopy;  Laterality: N/A;  . ELBOW ARTHROSCOPY  2001   lt  . GUM SURGERY    . OLECRANON BURSECTOMY  11/12/2011   Procedure: OLECRANON BURSA;  Surgeon: Linna Hoff;  Location: [place];  Service: Orthopedics;  Laterality: Right;  olecracnon bursectomy with delayed closure  . WISDOM TOOTH EXTRACTION      Family History  Problem Relation Age of Onset  . Hypertension Father   . Diabetes Father   . Heart disease Father   . Diabetes Mother   . Heart disease Mother   . Hypertension Mother   . Cancer Mother        bladder  . Diabetes Sister   . Stroke Sister        mini stroke  . Diabetes Brother   . Diabetes Sister   . Diabetes Sister   . Diabetes Sister   . Diabetes Sister     Social History   Socioeconomic History  . Marital status: Married    Spouse name: Not on file  . Number of children: Not on file  . Years of education: Not on file  . Highest education level: Not on file  Occupational History  . Not on file  Social Needs  . Financial resource strain: Not on file  . Food insecurity    Worry: Not on file    Inability: Not on file  . Transportation needs    Medical: Not on file    Non-medical: Not on file  Tobacco Use  . Smoking status: Current Every Day Smoker    Packs/day: 0.50    Years: 47.00    Pack years: 23.50    Types: Cigarettes    Last attempt to quit: 11/09/2012    Years since quitting: 6.5  . Smokeless tobacco: Never Used   Substance and Sexual Activity  . Alcohol use: Yes    Alcohol/week: 0.0 standard drinks    Comment: occasional  . Drug use: No  . Sexual activity: Yes    Birth control/protection: None  Lifestyle  . Physical activity    Days per week: Not on file    Minutes per session: Not on file  . Stress: Not on  file  Relationships  . Social Herbalist on phone: Not on file    Gets together: Not on file    Attends religious service: Not on file    Active member of club or organization: Not on file    Attends meetings of clubs or organizations: Not on file    Relationship status: Not on file  . Intimate partner violence    Fear of current or ex partner: Not on file    Emotionally abused: Not on file    Physically abused: Not on file    Forced sexual activity: Not on file  Other Topics Concern  . Not on file  Social History Narrative  . Not on file     Current Outpatient Medications:  .  aspirin 81 MG tablet, Take 81 mg by mouth daily. Take 1 tablet (81 mg) by mouth once daily, Disp: , Rfl:  .  Aspirin-Calcium Carbonate 81-777 MG TABS, Take by mouth., Disp: , Rfl:  .  Cinnamon Bark POWD, Take by mouth., Disp: , Rfl:  .  CINNAMON PO, Take 1,000 mg by mouth 2 (two) times daily. Reported on 05/06/2016, Disp: , Rfl:  .  finasteride (PROSCAR) 5 MG tablet, Take 1 tablet (5 mg total) by mouth daily., Disp: 90 tablet, Rfl: 3 .  gabapentin (NEURONTIN) 300 MG capsule, TAKE 1 CAPSULE BY MOUTH EVERYDAY AT BEDTIME, Disp: 90 capsule, Rfl: 3 .  lisinopril (PRINIVIL,ZESTRIL) 30 MG tablet, Take 1 tablet (30 mg total) by mouth daily., Disp: 90 tablet, Rfl: 3 .  metFORMIN (GLUCOPHAGE) 500 MG tablet, Take 1 tablet (500 mg total) by mouth 2 (two) times daily with a meal., Disp: 180 tablet, Rfl: 1 .  nitroGLYCERIN (NITROSTAT) 0.4 MG SL tablet, Place 1 tablet (0.4 mg total) under the tongue every 5 (five) minutes as needed., Disp: 25 tablet, Rfl: 3 .  pantoprazole (PROTONIX) 40 MG tablet, Take 1 tablet  (40 mg total) by mouth daily., Disp: 90 tablet, Rfl: 0 .  pyridOXINE (VITAMIN B-6) 100 MG tablet, Take 100 mg by mouth daily. Reported on 05/06/2016, Disp: , Rfl:  .  tadalafil (CIALIS) 5 MG tablet, Take 5 mg by mouth daily. Reported on 05/06/2016, Disp: , Rfl:  .  Vitamin D, Ergocalciferol, (DRISDOL) 1.25 MG (50000 UT) CAPS capsule, Take 1 capsule (50,000 Units total) by mouth every 7 (seven) days., Disp: 4 capsule, Rfl: 1 .  atorvastatin (LIPITOR) 20 MG tablet, Take 1 tablet (20 mg total) by mouth daily., Disp: 90 tablet, Rfl: 3 .  HYDROcodone-acetaminophen (NORCO/VICODIN) 5-325 MG tablet, Take 1 tablet by mouth every 6 (six) hours as needed for moderate pain or severe pain. (Patient not taking: Reported on 05/11/2019), Disp: 14 tablet, Rfl: 0 .  sulfamethoxazole-trimethoprim (BACTRIM DS) 800-160 MG tablet, Take 1 tablet by mouth 2 (two) times daily., Disp: , Rfl:   No Known Allergies  I personally reviewed active problem list, medication list, allergies, health maintenance, notes from last encounter, lab results with the patient/caregiver today.   ROS Constitutional: Negative for fever or weight change.  Respiratory: Negative for cough and shortness of breath.   Cardiovascular: Negative for chest pain or palpitations.  Gastrointestinal: Negative for abdominal pain, no bowel changes.  Musculoskeletal: Negative for gait problem or joint swelling.  Skin: Negative for rash.  Neurological: Negative for dizziness or headache.  No other specific complaints in a complete review of systems (except as listed in HPI above).  Objective  Vitals:   05/11/19 1043  BP: 120/76  Pulse: 85  Resp: 18  Temp: 97.9 F (36.6 C)  TempSrc: Oral  SpO2: 93%  Weight: 163 lb 9.6 oz (74.2 kg)  Height: 6' (1.829 m)   Body mass index is 22.19 kg/m.  Physical Exam  Constitutional: Patient appears well-developed and well-nourished. No distress.  HENT: Head: Normocephalic and atraumatic. Eyes: Conjunctivae  and EOM are normal. No scleral icterus.   Neck: Normal range of motion. Neck supple. No JVD present. No thyromegaly present.  Cardiovascular: Normal rate, regular rhythm and normal heart sounds.  No murmur heard. No BLE edema. Pulmonary/Chest: Effort normal and breath sounds normal. No respiratory distress. Musculoskeletal: Normal range of motion, no joint effusions. No gross deformities Neurological: Pt is alert and oriented to person, place, and time. No cranial nerve deficit. Coordination, balance, strength, speech and gait are normal.  Skin: Skin is warm and dry. No rash noted. No erythema. Bilateral feet without skin breakdown, ambutation of the LEFT great toe is present. Psychiatric: Patient has a normal mood and affect. behavior is normal. Judgment and thought content normal.  No results found for this or any previous visit (from the past 72 hour(s)).  PHQ2/9: Depression screen [facility] 2/9 05/11/2019 01/09/2019 01/03/2019 10/28/2018 10/28/2018  Decreased Interest 0 0 0 0 0  Down, Depressed, Hopeless 0 0 0 0 0  PHQ - 2 Score 0 0 0 0 0  Altered sleeping 0 0 0 0 -  Tired, decreased energy 0 0 0 0 -  Change in appetite 0 0 0 0 -  Feeling bad or failure about yourself  0 0 0 0 -  Trouble concentrating 0 0 0 0 -  Moving slowly or fidgety/restless 0 0 0 0 -  Suicidal thoughts 0 0 0 0 -  PHQ-9 Score 0 0 0 0 -  Difficult doing work/chores Not difficult at all Not difficult at all Not difficult at all Not difficult at all -   PHQ-2/9 Result is negative.    Fall Risk: Fall Risk  05/11/2019 01/09/2019 01/03/2019 10/28/2018 04/27/2018  Falls in the past year? 0 0 0 0 No  Number falls in past yr: 0 0 0 0 -  Injury with Fall? 0 0 0 0 -  Follow up Falls evaluation completed - Falls evaluation completed - -   Assessment & Plan  1. Diabetic polyneuropathy associated with type 2 diabetes mellitus ([place]) - Doing well on GAbapentin  2. Controlled type 2 diabetes mellitus with other skin complication,  without long-term current use of insulin ([facility]) - Urine Microalbumin w/creat. ratio - COMPLETE METABOLIC PANEL WITH GFR - Hemoglobin A1c - Lipid panel  3. Left great toe amputee ([place]) - Doing well  4. Chronic systolic congestive heart failure ([place]) - Seeing Cardiology annually - Lipid panel  5. Hypertensive heart disease without heart failure - At goal today; continue lisinopril - Lipid panel  6. Coronary artery disease involving native coronary artery of native heart without angina pectoris - Seeing Cardiology annually - Lipid panel  7. Gastroesophageal reflux disease, esophagitis presence not specified - Taking protonix and seeing Dr. Marius Ditch  8. Rectal bleeding - Seeing Dr. Marius Ditch  9. Mixed hyperlipidemia - Continue Statin - Lipid panel  10. Hyperkalemia - CMP Today  11. Erectile dysfunction, unspecified erectile dysfunction type - Cialis and proscar, continue with Dr. Rogers Blocker annually for PSA checks.

## 2019-05-12 LAB — LIPID PANEL
Cholesterol: 124 mg/dL (ref <200)
HDL: 45 mg/dL (ref >=40)
LDL Cholesterol (Calc): 64 mg/dL (calc)
Non-HDL Cholesterol (Calc): 79 mg/dL (calc) (ref <130)
Total CHOL/HDL Ratio: 2.8 (calc) (ref <5.0)
Triglycerides: 74 mg/dL (ref <150)

## 2019-05-12 LAB — MICROALBUMIN / CREATININE URINE RATIO
Creatinine, Urine: 61 mg/dL (ref 20–320)
Microalb Creat Ratio: 8 mcg/mg creat (ref <30)
Microalb, Ur: 0.5 mg/dL

## 2019-05-12 LAB — COMPLETE METABOLIC PANEL WITH GFR
AG Ratio: 1.7 (calc) (ref 1.0–2.5)
ALT: 21 U/L (ref 9–46)
AST: 24 U/L (ref 10–35)
Albumin: 4.3 g/dL (ref 3.6–5.1)
Alkaline phosphatase (APISO): 49 U/L (ref 35–144)
BUN: 16 mg/dL (ref 7–25)
CO2: 24 mmol/L (ref 20–32)
Calcium: 9.7 mg/dL (ref 8.6–10.3)
Chloride: 103 mmol/L (ref 98–110)
Creat: 0.87 mg/dL (ref 0.70–1.25)
GFR, Est African American: 107 mL/min/{1.73_m2} (ref >=60)
GFR, Est Non African American: 92 mL/min/{1.73_m2} (ref >=60)
Globulin: 2.5 g/dL (calc) (ref 1.9–3.7)
Glucose, Bld: 108 mg/dL — ABNORMAL HIGH (ref 65–99)
Potassium: 5 mmol/L (ref 3.5–5.3)
Sodium: 137 mmol/L (ref 135–146)
Total Bilirubin: 0.6 mg/dL (ref 0.2–1.2)
Total Protein: 6.8 g/dL (ref 6.1–8.1)

## 2019-05-12 LAB — HEMOGLOBIN A1C
Hgb A1c MFr Bld: 5.7 % of total Hgb — ABNORMAL HIGH (ref <5.7)
Mean Plasma Glucose: 117 (calc)
eAG (mmol/L): 6.5 (calc)

## 2019-05-31 ENCOUNTER — Encounter: Payer: Self-pay | Admitting: Gastroenterology

## 2019-05-31 ENCOUNTER — Other Ambulatory Visit: Payer: Self-pay

## 2019-05-31 ENCOUNTER — Ambulatory Visit (INDEPENDENT_AMBULATORY_CARE_PROVIDER_SITE_OTHER): Payer: 59 | Admitting: Gastroenterology

## 2019-05-31 VITALS — BP 151/86 | HR 79 | Temp 98.1°F | Resp 17 | Ht 72.0 in | Wt 159.6 lb

## 2019-05-31 DIAGNOSIS — K625 Hemorrhage of anus and rectum: Secondary | ICD-10-CM

## 2019-05-31 DIAGNOSIS — D500 Iron deficiency anemia secondary to blood loss (chronic): Secondary | ICD-10-CM

## 2019-05-31 MED ORDER — FUSION PLUS PO CAPS: 1.0000 | ORAL_CAPSULE | Freq: Every day | ORAL | 1 refills | Status: AC

## 2019-05-31 NOTE — Progress Notes (Signed)
Cephas Darby, MD [address]  [place]  [place], [place] [address]  Main: [phone]  Fax: [phone] Pager: [phone]   Primary Care Physician: Hubbard Hartshorn, FNP  Primary Gastroenterologist:  Dr. Cephas Darby  Chief Complaint  Patient presents with  . Follow-up  . Anemia    HPI: Brian Dean is a 63 y.o. male is referred for management of rectal bleeding and IDA.  He has history of internal hemorrhoids, underwent hemorrhoid ligation during colonoscopy by Dr. Bary Castilla in 2017 for same complaint.  Patient reports that he has been noticing bright red blood per rectum on wiping only, it is occasional, occurs about once or twice a month.  He denies any other GI symptoms.  He denies constipation.  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  He is not taking iron supplementation.  He denies black stools, abdominal pain.  Follow up visit 05/31/19 He reports doing fairly well today.  He has been taking fusion plus daily requesting refill.  He continues to have intermittent scant bright red blood per rectum, occasional.  He denies any other complaints today.   Current Outpatient Medications  Medication Sig Dispense Refill  . aspirin 81 MG tablet Take 81 mg by mouth daily. Take 1 tablet (81 mg) by mouth once daily    . Cinnamon Bark POWD Take by mouth.    . finasteride (PROSCAR) 5 MG tablet Take 1 tablet (5 mg total) by mouth daily. 90 tablet 3  . gabapentin (NEURONTIN) 300 MG capsule TAKE 1 CAPSULE BY MOUTH EVERYDAY AT BEDTIME 90 capsule 3  . lisinopril (PRINIVIL,ZESTRIL) 30 MG tablet Take 1 tablet (30 mg total) by mouth daily. 90 tablet 3  . metFORMIN (GLUCOPHAGE) 500 MG tablet Take 1 tablet (500 mg total) by mouth 2 (two) times daily with a meal. 180 tablet 1  . nitroGLYCERIN (NITROSTAT) 0.4 MG SL tablet Place 1 tablet (0.4 mg total)  under the tongue every 5 (five) minutes as needed. 25 tablet 3  . pantoprazole (PROTONIX) 40 MG tablet Take 1 tablet (40 mg total) by mouth daily. 90 tablet 0  . tadalafil (CIALIS) 5 MG tablet Take 5 mg by mouth daily. Reported on 05/06/2016    . Aspirin-Calcium Carbonate 81-777 MG TABS Take by mouth.    Marland Kitchen atorvastatin (LIPITOR) 20 MG tablet Take 1 tablet (20 mg total) by mouth daily. 90 tablet 3  . CINNAMON PO Take 1,000 mg by mouth 2 (two) times daily. Reported on 05/06/2016    . Iron-FA-B Cmp-C-Biot-Probiotic (FUSION PLUS) CAPS Take 1 capsule by mouth daily with breakfast. 30 capsule 1  . pyridOXINE (VITAMIN B-6) 100 MG tablet Take 100 mg by mouth daily. Reported on 05/06/2016    . Vitamin D, Ergocalciferol, (DRISDOL) 1.25 MG (50000 UT) CAPS capsule Take 1 capsule (50,000 Units total) by mouth every 7 (seven) days. (Patient not taking: Reported on 05/31/2019) 4 capsule 1   No current facility-administered medications for this visit.     Allergies as of 05/31/2019  . (No Known Allergies)   NSAIDs: None  Antiplts/Anticoagulants/Anti thrombotics: Aspirin 81  GI Procedures:  Colonoscopy 06/22/2016 - Eight benign appearing 5 mm polyps in the rectum and at the recto-sigmoid colon. Biopsied. - Internal hemorrhoids. Bandedx5.   ROS:  General: Negative for anorexia, weight loss, fever, chills, fatigue, weakness. ENT: Negative for hoarseness, difficulty  swallowing , nasal congestion. CV: Negative for chest pain, angina, palpitations, dyspnea on exertion, peripheral edema.  Respiratory: Negative for dyspnea at rest, dyspnea on exertion, cough, sputum, wheezing.  GI: See history of present illness. GU:  Negative for dysuria, hematuria, urinary incontinence, urinary frequency, nocturnal urination.  Endo: Negative for unusual weight change.    Physical Examination:   BP (!) 151/86 (BP Location: Left Arm, Patient Position: Sitting, Cuff Size: Normal)   Pulse 79   Temp 98.1 F (36.7 C)    Resp 17   Ht 6' (1.829 m)   Wt 159 lb 9.6 oz (72.4 kg)   BMI 21.65 kg/m   General: Well-nourished, well-developed in no acute distress.  Eyes: No icterus. Conjunctivae pink. Mouth: Oropharyngeal mucosa moist and pink , no lesions erythema or exudate. Lungs: Clear to auscultation bilaterally. Non-labored. Heart: Regular rate and rhythm, no murmurs rubs or gallops.  Abdomen: Bowel sounds are normal, nontender, nondistended, no hepatosplenomegaly or masses, no hernia , no rebound or guarding.   Extremities: No lower extremity edema. No clubbing or deformities. Neuro: Alert and oriented x 3.  Grossly intact. Skin: Warm and dry, no jaundice.   Psych: Alert and cooperative, normal mood and affect.   Imaging Studies: No results found.  Assessment and Plan:   Brian Dean is a 63 y.o. male with history of internal hemorrhoids status post banding, here for follow up of rectal bleeding and IDA   Iron deficiency anemia Discussed with him about upper endoscopy and colonoscopy and patient is agreeable Recheck labs today Continue fusion +1 pill daily  Rectal bleeding, minimal Will consider repeat hemorrhoid ligation based on the colonoscopy findings   Follow up in 3 months   Dr Sherri Sear, MD

## 2019-06-01 LAB — IRON AND TIBC
Iron Saturation: 27 % (ref 15–55)
Iron: 110 ug/dL (ref 38–169)
Total Iron Binding Capacity: 402 ug/dL (ref 250–450)
UIBC: 292 ug/dL (ref 111–343)

## 2019-06-01 LAB — CBC
Hematocrit: 38.9 % (ref 37.5–51.0)
Hemoglobin: 13.4 g/dL (ref 13.0–17.7)
MCH: 31.4 pg (ref 26.6–33.0)
MCHC: 34.4 g/dL (ref 31.5–35.7)
MCV: 91 fL (ref 79–97)
Platelets: 237 10*3/uL (ref 150–450)
RBC: 4.27 x10E6/uL (ref 4.14–5.80)
RDW: 12.7 % (ref 11.6–15.4)
WBC: 8.7 10*3/uL (ref 3.4–10.8)

## 2019-06-01 LAB — FERRITIN: Ferritin: 41 ng/mL (ref 30–400)

## 2019-06-01 LAB — B12 AND FOLATE PANEL
Folate: 14.8 ng/mL (ref >3.0)
Vitamin B-12: 522 pg/mL (ref 232–1245)

## 2019-06-20 ENCOUNTER — Encounter: Payer: Self-pay | Admitting: *Deleted

## 2019-06-20 ENCOUNTER — Other Ambulatory Visit: Payer: Self-pay | Admitting: Cardiovascular Disease

## 2019-06-20 ENCOUNTER — Other Ambulatory Visit: Payer: Self-pay

## 2019-06-23 ENCOUNTER — Other Ambulatory Visit
Admission: RE | Admit: 2019-06-23 | Discharge: 2019-06-23 | Disposition: A | Discharge status: Home / Self Care | Payer: 59 | Source: Ambulatory Visit | Attending: Gastroenterology | Admitting: Gastroenterology

## 2019-06-23 ENCOUNTER — Other Ambulatory Visit: Payer: Self-pay

## 2019-06-23 DIAGNOSIS — Z20828 Contact with and (suspected) exposure to other viral communicable diseases: Secondary | ICD-10-CM | POA: Diagnosis not present

## 2019-06-23 DIAGNOSIS — Z01812 Encounter for preprocedural laboratory examination: Secondary | ICD-10-CM | POA: Diagnosis not present

## 2019-06-24 LAB — SARS CORONAVIRUS 2 (TAT 6-24 HRS): SARS Coronavirus 2: NEGATIVE

## 2019-06-27 NOTE — Discharge Instructions (Signed)

## 2019-06-28 ENCOUNTER — Ambulatory Visit: Payer: 59 | Admitting: Anesthesiology

## 2019-06-28 ENCOUNTER — Ambulatory Visit
Admission: RE | Admit: 2019-06-28 | Discharge: 2019-06-28 | Disposition: A | Discharge status: Home / Self Care | Payer: 59 | Attending: Gastroenterology | Admitting: Gastroenterology

## 2019-06-28 ENCOUNTER — Other Ambulatory Visit: Payer: Self-pay

## 2019-06-28 ENCOUNTER — Encounter
Admission: RE | Disposition: A | Discharge status: Home / Self Care | Payer: Self-pay | Source: Home / Self Care | Attending: Gastroenterology

## 2019-06-28 DIAGNOSIS — F17210 Nicotine dependence, cigarettes, uncomplicated: Secondary | ICD-10-CM | POA: Insufficient documentation

## 2019-06-28 DIAGNOSIS — K219 Gastro-esophageal reflux disease without esophagitis: Secondary | ICD-10-CM | POA: Diagnosis not present

## 2019-06-28 DIAGNOSIS — I110 Hypertensive heart disease with heart failure: Secondary | ICD-10-CM | POA: Insufficient documentation

## 2019-06-28 DIAGNOSIS — K449 Diaphragmatic hernia without obstruction or gangrene: Secondary | ICD-10-CM | POA: Insufficient documentation

## 2019-06-28 DIAGNOSIS — M1990 Unspecified osteoarthritis, unspecified site: Secondary | ICD-10-CM | POA: Diagnosis not present

## 2019-06-28 DIAGNOSIS — Z8249 Family history of ischemic heart disease and other diseases of the circulatory system: Secondary | ICD-10-CM | POA: Diagnosis not present

## 2019-06-28 DIAGNOSIS — D500 Iron deficiency anemia secondary to blood loss (chronic): Secondary | ICD-10-CM

## 2019-06-28 DIAGNOSIS — K635 Polyp of colon: Secondary | ICD-10-CM

## 2019-06-28 DIAGNOSIS — Z7984 Long term (current) use of oral hypoglycemic drugs: Secondary | ICD-10-CM | POA: Insufficient documentation

## 2019-06-28 DIAGNOSIS — K648 Other hemorrhoids: Secondary | ICD-10-CM | POA: Insufficient documentation

## 2019-06-28 DIAGNOSIS — D123 Benign neoplasm of transverse colon: Secondary | ICD-10-CM | POA: Diagnosis not present

## 2019-06-28 DIAGNOSIS — E1140 Type 2 diabetes mellitus with diabetic neuropathy, unspecified: Secondary | ICD-10-CM | POA: Diagnosis not present

## 2019-06-28 DIAGNOSIS — I5020 Unspecified systolic (congestive) heart failure: Secondary | ICD-10-CM | POA: Diagnosis not present

## 2019-06-28 DIAGNOSIS — D509 Iron deficiency anemia, unspecified: Secondary | ICD-10-CM | POA: Insufficient documentation

## 2019-06-28 DIAGNOSIS — E7800 Pure hypercholesterolemia, unspecified: Secondary | ICD-10-CM | POA: Diagnosis not present

## 2019-06-28 DIAGNOSIS — I2510 Atherosclerotic heart disease of native coronary artery without angina pectoris: Secondary | ICD-10-CM | POA: Insufficient documentation

## 2019-06-28 DIAGNOSIS — Z7982 Long term (current) use of aspirin: Secondary | ICD-10-CM | POA: Diagnosis not present

## 2019-06-28 DIAGNOSIS — K228 Other specified diseases of esophagus: Secondary | ICD-10-CM | POA: Insufficient documentation

## 2019-06-28 DIAGNOSIS — E785 Hyperlipidemia, unspecified: Secondary | ICD-10-CM | POA: Diagnosis not present

## 2019-06-28 DIAGNOSIS — K644 Residual hemorrhoidal skin tags: Secondary | ICD-10-CM | POA: Diagnosis not present

## 2019-06-28 DIAGNOSIS — Z79899 Other long term (current) drug therapy: Secondary | ICD-10-CM | POA: Insufficient documentation

## 2019-06-28 HISTORY — DX: Presence of dental prosthetic device (complete) (partial): Z97.2

## 2019-06-28 HISTORY — DX: Anemia, unspecified: D64.9

## 2019-06-28 HISTORY — PX: ESOPHAGOGASTRODUODENOSCOPY (EGD) WITH PROPOFOL: SHX5813

## 2019-06-28 HISTORY — PX: POLYPECTOMY: SHX5525

## 2019-06-28 HISTORY — PX: COLONOSCOPY WITH PROPOFOL: SHX5780

## 2019-06-28 LAB — GLUCOSE, CAPILLARY
Glucose-Capillary: 105 mg/dL — ABNORMAL HIGH (ref 70–99)
Glucose-Capillary: 112 mg/dL — ABNORMAL HIGH (ref 70–99)

## 2019-06-28 SURGERY — ESOPHAGOGASTRODUODENOSCOPY (EGD) WITH PROPOFOL
Anesthesia: General

## 2019-06-28 MED ORDER — SODIUM CHLORIDE 0.9 % IV SOLN: INTRAVENOUS | Status: DC

## 2019-06-28 MED ORDER — ACETAMINOPHEN 325 MG PO TABS: 325.0000 mg | ORAL_TABLET | Freq: Once | ORAL | Status: DC

## 2019-06-28 MED ORDER — LACTATED RINGERS IV SOLN
INTRAVENOUS | Status: DC | PRN
  Administered 2019-06-28: 09:00:00 via INTRAVENOUS

## 2019-06-28 MED ORDER — EPHEDRINE SULFATE 50 MG/ML IJ SOLN
INTRAMUSCULAR | Status: DC | PRN
  Administered 2019-06-28 (×3): 5 mg via INTRAVENOUS

## 2019-06-28 MED ORDER — LIDOCAINE HCL (CARDIAC) PF 100 MG/5ML IV SOSY
PREFILLED_SYRINGE | INTRAVENOUS | Status: DC | PRN
  Administered 2019-06-28: 30 mg via INTRAVENOUS

## 2019-06-28 MED ORDER — GLYCOPYRROLATE 0.2 MG/ML IJ SOLN
INTRAMUSCULAR | Status: DC | PRN
  Administered 2019-06-28: 0.1 mg via INTRAVENOUS

## 2019-06-28 MED ORDER — PROPOFOL 10 MG/ML IV BOLUS
INTRAVENOUS | Status: DC | PRN
  Administered 2019-06-28: 40 mg via INTRAVENOUS
  Administered 2019-06-28: 20 mg via INTRAVENOUS
  Administered 2019-06-28: 30 mg via INTRAVENOUS
  Administered 2019-06-28: 120 mg via INTRAVENOUS
  Administered 2019-06-28: 20 mg via INTRAVENOUS
  Administered 2019-06-28 (×2): 30 mg via INTRAVENOUS
  Administered 2019-06-28: 50 mg via INTRAVENOUS
  Administered 2019-06-28: 20 mg via INTRAVENOUS
  Administered 2019-06-28 (×2): 30 mg via INTRAVENOUS
  Administered 2019-06-28: 40 mg via INTRAVENOUS
  Administered 2019-06-28 (×3): 20 mg via INTRAVENOUS

## 2019-06-28 MED ORDER — ACETAMINOPHEN 160 MG/5ML PO SOLN: 325.0000 mg | Freq: Once | ORAL | Status: DC

## 2019-06-28 MED ORDER — STERILE WATER FOR IRRIGATION IR SOLN
Status: DC | PRN
  Administered 2019-06-28: 100 mL

## 2019-06-28 SURGICAL SUPPLY — 38 items
BALLN DILATOR 10-12 8 (BALLOONS)
BALLN DILATOR 12-15 8 (BALLOONS)
BALLN DILATOR 15-18 8 (BALLOONS)
BALLN DILATOR CRE 0-12 8 (BALLOONS)
BALLN DILATOR ESOPH 8 10 CRE (MISCELLANEOUS) IMPLANT
BALLOON DILATOR 12-15 8 (BALLOONS) IMPLANT
BALLOON DILATOR 15-18 8 (BALLOONS) IMPLANT
BALLOON DILATOR CRE 0-12 8 (BALLOONS) IMPLANT
BLOCK BITE 60FR ADLT L/F GRN (MISCELLANEOUS) ×3 IMPLANT
CANISTER SUCT 1200ML W/VALVE (MISCELLANEOUS) ×3 IMPLANT
CLIP HMST 235XBRD CATH ROT (MISCELLANEOUS) IMPLANT
CLIP RESOLUTION 360 11X235 (MISCELLANEOUS)
ELECT REM PT RETURN 9FT ADLT (ELECTROSURGICAL) ×3
ELECTRODE REM PT RTRN 9FT ADLT (ELECTROSURGICAL) IMPLANT
FCP ESCP3.2XJMB 240X2.8X (MISCELLANEOUS)
FORCEPS BIOP RAD 4 LRG CAP 4 (CUTTING FORCEPS) ×1 IMPLANT
FORCEPS BIOP RJ4 240 W/NDL (MISCELLANEOUS)
FORCEPS ESCP3.2XJMB 240X2.8X (MISCELLANEOUS) IMPLANT
GOWN CVR UNV OPN BCK APRN NK (MISCELLANEOUS) ×4 IMPLANT
GOWN ISOL THUMB LOOP REG UNIV (MISCELLANEOUS) ×6
INJECTOR VARIJECT VIN23 (MISCELLANEOUS) IMPLANT
KIT DEFENDO VALVE AND CONN (KITS) IMPLANT
KIT ENDO PROCEDURE OLY (KITS) ×3 IMPLANT
MARKER SPOT ENDO TATTOO 5ML (MISCELLANEOUS) IMPLANT
PROBE APC STR FIRE (PROBE) IMPLANT
RETRIEVER NET PLAT FOOD (MISCELLANEOUS) IMPLANT
RETRIEVER NET ROTH 2.5X230 LF (MISCELLANEOUS) IMPLANT
SNARE COLD EXACTO (MISCELLANEOUS) IMPLANT
SNARE LASSO HEX 3 IN 1 (INSTRUMENTS) ×1 IMPLANT
SNARE SHORT THROW 13M SML OVAL (MISCELLANEOUS) ×1 IMPLANT
SNARE SHORT THROW 30M LRG OVAL (MISCELLANEOUS) IMPLANT
SNARE SNG USE RND 15MM (INSTRUMENTS) IMPLANT
SPOT EX ENDOSCOPIC TATTOO (MISCELLANEOUS)
SYR INFLATION 60ML (SYRINGE) IMPLANT
TRAP ETRAP POLY (MISCELLANEOUS) ×1 IMPLANT
VARIJECT INJECTOR VIN23 (MISCELLANEOUS)
WATER STERILE IRR 250ML POUR (IV SOLUTION) ×3 IMPLANT
WIRE CRE 18-20MM 8CM F G (MISCELLANEOUS) IMPLANT

## 2019-06-28 NOTE — Anesthesia Procedure Notes (Signed)
Date/Time: 06/28/2019 8:41 AM Performed by: Cameron Ali, CRNA Pre-anesthesia Checklist: Patient identified, Emergency Drugs available, Suction available, Timeout performed and Patient being monitored Patient Re-evaluated:Patient Re-evaluated prior to induction Oxygen Delivery Method: Nasal cannula Placement Confirmation: positive ETCO2

## 2019-06-28 NOTE — Anesthesia Preprocedure Evaluation (Signed)
Anesthesia Evaluation  Patient identified by MRN, date of birth, ID band Patient awake    Reviewed: Allergy & Precautions, H&P , NPO status , Patient's Chart, lab work & pertinent test results  Airway Mallampati: II  TM Distance: >3 FB Neck ROM: full    Dental no notable dental hx.    Pulmonary Current Smoker and Patient abstained from smoking.,    Pulmonary exam normal breath sounds clear to auscultation       Cardiovascular hypertension, + CAD  Normal cardiovascular exam Rhythm:regular Rate:Normal     Neuro/Psych    GI/Hepatic GERD  ,  Endo/Other  diabetes  Renal/GU      Musculoskeletal   Abdominal   Peds  Hematology  (+) Blood dyscrasia, anemia ,   Anesthesia Other Findings   Reproductive/Obstetrics                             Anesthesia Physical Anesthesia Plan  ASA: III  Anesthesia Plan: General   Post-op Pain Management:    Induction: Intravenous  PONV Risk Score and Plan: 2 and Propofol infusion and Treatment may vary due to age or medical condition  Airway Management Planned: Natural Airway  Additional Equipment:   Intra-op Plan:   Post-operative Plan:   Informed Consent: I have reviewed the patients History and Physical, chart, labs and discussed the procedure including the risks, benefits and alternatives for the proposed anesthesia with the patient or authorized representative who has indicated his/her understanding and acceptance.       Plan Discussed with: CRNA  Anesthesia Plan Comments:         Anesthesia Quick Evaluation

## 2019-06-28 NOTE — Transfer of Care (Signed)
Immediate Anesthesia Transfer of Care Note  Patient: Brian Dean  Procedure(s) Performed: ESOPHAGOGASTRODUODENOSCOPY (EGD) WITH PROPOFOL (N/A ) COLONOSCOPY WITH PROPOFOL (N/A ) POLYPECTOMY  Patient Location: PACU  Anesthesia Type: General  Level of Consciousness: awake, alert  and patient cooperative  Airway and Oxygen Therapy: Patient Spontanous Breathing and Patient connected to supplemental oxygen  Post-op Assessment: Post-op Vital signs reviewed, Patient's Cardiovascular Status Stable, Respiratory Function Stable, Patent Airway and No signs of Nausea or vomiting  Post-op Vital Signs: Reviewed and stable  Complications: No apparent anesthesia complications

## 2019-06-28 NOTE — H&P (Signed)
Cephas Darby, MD [address]  [place]  [place], [place] [address]  Main: [phone]  Fax: [phone] Pager: [phone]  Primary Care Physician:  Hubbard Hartshorn, FNP Primary Gastroenterologist:  Dr. Cephas Darby  Pre-Procedure History & Physical: HPI:  Brian Dean is a 63 y.o. male is here for an endoscopy and colonoscopy.   Past Medical History:  Diagnosis Date  . Anemia   . Arthritis   . CAD (coronary artery disease) Nov 2014   diffuse, aggresive risk factor modification recommended  . Diabetes mellitus without complication ([place])   . Diabetic neuropathy ([place])   . Dyslipidemia   . ED (erectile dysfunction)   . GERD (gastroesophageal reflux disease)   . History of acute pyelonephritis Aug. 2014   hospitalized; 100k E. coli pansensitive  . Hypercholesteremia   . Hyperlipidemia   . Hypertension   . Hypertensive heart disease   . Inguinal hernia   . Osteomyelitis ([place])   . Osteomyelitis of left foot ([place]) 01/03/2019  . Sciatica   . Syncope and collapse   . Tobacco use   . Unspecified systolic (congestive) heart failure ([place])   . Wears dentures    partial upper and lower    Past Surgical History:  Procedure Laterality Date  . AMPUTATION Left 01/04/2019   Procedure: AMPUTATION RAY LEFT GREAT TOE;  Surgeon: Sharlotte Alamo, DPM;  Location: [facility] ORS;  Service: Podiatry;  Laterality: Left;  . CARDIAC CATHETERIZATION  11/14   [facility] x1  . COLONOSCOPY  2004   [facility]  . COLONOSCOPY WITH PROPOFOL N/A 06/22/2016   Procedure: COLONOSCOPY WITH PROPOFOL;  Surgeon: Robert Bellow, MD;  Location: [facility] ENDOSCOPY;  Service: Endoscopy;  Laterality: N/A;  . ELBOW ARTHROSCOPY  2001   lt  . GUM SURGERY    . OLECRANON BURSECTOMY  11/12/2011   Procedure: OLECRANON BURSA;  Surgeon: Linna Hoff;  Location: [place];  Service: Orthopedics;  Laterality: Right;  olecracnon bursectomy with delayed closure  . WISDOM TOOTH EXTRACTION      Prior to Admission  medications   Medication Sig Start Date End Date Taking? Authorizing Provider  aspirin 81 MG tablet Take 81 mg by mouth daily. Take 1 tablet (81 mg) by mouth once daily   Yes [provider]  atorvastatin (LIPITOR) 20 MG tablet Take 1 tablet (20 mg total) by mouth daily. 12/19/18 06/20/19 Yes Gollan, Kathlene November, MD  CINNAMON PO Take 1,000 mg by mouth 2 (two) times daily. Reported on 05/06/2016   Yes [provider]  finasteride (PROSCAR) 5 MG tablet Take 1 tablet (5 mg total) by mouth daily. 08/26/15  Yes Gollan, Kathlene November, MD  gabapentin (NEURONTIN) 300 MG capsule TAKE 1 CAPSULE BY MOUTH EVERYDAY AT BEDTIME 11/21/18  Yes Lada, Satira Anis, MD  HYDROcodone-acetaminophen (NORCO/VICODIN) 5-325 MG tablet Take 1 tablet by mouth every 6 (six) hours as needed for moderate pain.   Yes [provider]  Iron-FA-B Cmp-C-Biot-Probiotic (FUSION PLUS) CAPS Take 1 capsule by mouth daily with breakfast. 05/31/19 06/30/19 Yes Vanga, Tally Due, MD  lisinopril (PRINIVIL,ZESTRIL) 30 MG tablet Take 1 tablet (30 mg total) by mouth daily. 11/11/18  Yes Lada, Satira Anis, MD  metFORMIN (GLUCOPHAGE) 500 MG tablet Take 1 tablet (500 mg total) by mouth 2 (two) times daily with a meal. 11/16/18  Yes Lada, Satira Anis, MD  nitroGLYCERIN (NITROSTAT) 0.4 MG SL tablet Place 1 tablet (0.4 mg total) under the tongue every 5 (five) minutes as  needed. 12/19/18  Yes Minna Merritts, MD  pantoprazole (PROTONIX) 40 MG tablet TAKE 1 TABLET BY MOUTH EVERY DAY 06/20/19  Yes Gollan, Kathlene November, MD  tadalafil (CIALIS) 5 MG tablet Take 5 mg by mouth daily. Reported on 05/06/2016   Yes [provider]  Vitamin D, Ergocalciferol, (DRISDOL) 1.25 MG (50000 UT) CAPS capsule Take 1 capsule (50,000 Units total) by mouth every 7 (seven) days. Patient not taking: Reported on 05/31/2019 01/10/19   Arnetha Courser, MD    Allergies as of 05/31/2019  . (No Known Allergies)    Family History  Problem Relation Age of Onset  .  Hypertension Father   . Diabetes Father   . Heart disease Father   . Diabetes Mother   . Heart disease Mother   . Hypertension Mother   . Cancer Mother        bladder  . Diabetes Sister   . Stroke Sister        mini stroke  . Diabetes Brother   . Diabetes Sister   . Diabetes Sister   . Diabetes Sister   . Diabetes Sister     Social History   Socioeconomic History  . Marital status: Married    Spouse name: Not on file  . Number of children: Not on file  . Years of education: Not on file  . Highest education level: Not on file  Occupational History  . Not on file  Social Needs  . Financial resource strain: Not on file  . Food insecurity    Worry: Not on file    Inability: Not on file  . Transportation needs    Medical: Not on file    Non-medical: Not on file  Tobacco Use  . Smoking status: Current Every Day Smoker    Packs/day: 0.50    Years: 47.00    Pack years: 23.50    Types: Cigarettes    Last attempt to quit: 11/09/2012    Years since quitting: 6.6  . Smokeless tobacco: Never Used  . Tobacco comment: down to 5 cigs/day  Substance and Sexual Activity  . Alcohol use: Yes    Alcohol/week: 0.0 standard drinks    Comment: occasional (3-4x/yr)  . Drug use: No  . Sexual activity: Yes    Birth control/protection: None  Lifestyle  . Physical activity    Days per week: Not on file    Minutes per session: Not on file  . Stress: Not on file  Relationships  . Social Herbalist on phone: Not on file    Gets together: Not on file    Attends religious service: Not on file    Active member of club or organization: Not on file    Attends meetings of clubs or organizations: Not on file    Relationship status: Not on file  . Intimate partner violence    Fear of current or ex partner: Not on file    Emotionally abused: Not on file    Physically abused: Not on file    Forced sexual activity: Not on file  Other Topics Concern  . Not on file  Social History  Narrative  . Not on file    Review of Systems: See HPI, otherwise negative ROS  Physical Exam: BP (!) 153/75   Pulse (!) 55   Temp (!) 97.3 F (36.3 C)   Ht 6' (1.829 m)   Wt 72.6 kg   SpO2 100%   BMI  21.70 kg/m  General:   Alert,  pleasant and cooperative in NAD Head:  Normocephalic and atraumatic. Neck:  Supple; no masses or thyromegaly. Lungs:  Clear throughout to auscultation.    Heart:  Regular rate and rhythm. Abdomen:  Soft, nontender and nondistended. Normal bowel sounds, without guarding, and without rebound.   Neurologic:  Alert and  oriented x4;  grossly normal neurologically.  Impression/Plan: Brian Dean is here for an endoscopy and colonoscopy to be performed for IDA  Risks, benefits, limitations, and alternatives regarding  endoscopy and colonoscopy have been reviewed with the patient.  Questions have been answered.  All parties agreeable.   Sherri Sear, MD  06/28/2019, 8:30 AM

## 2019-06-28 NOTE — Op Note (Signed)
[facility] Gastroenterology Patient Name: Brian Dean Procedure Date: 06/28/2019 7:44 AM MRN: [id number] Account #: [id number] Date of Birth: 1956-03-20 Admit Type: Outpatient Age: 63 Room: [facility] OR ROOM 01 Gender: Male Note Status: Finalized Procedure:            Upper GI endoscopy Indications:          Unexplained iron deficiency anemia Providers:            Lin Landsman MD, MD Referring MD:         [place] (Referring MD) Medicines:            Monitored Anesthesia Care Complications:        No immediate complications. Estimated blood loss: None. Procedure:            Pre-Anesthesia Assessment:                       - Prior to the procedure, a History and Physical was                        performed, and patient medications and allergies were                        reviewed. The patient is competent. The risks and                        benefits of the procedure and the sedation options and                        risks were discussed with the patient. All questions                        were answered and informed consent was obtained.                        Patient identification and proposed procedure were                        verified by the physician, the nurse, the                        anesthesiologist, the anesthetist and the technician in                        the pre-procedure area in the procedure room in the                        endoscopy suite. Mental Status Examination: alert and                        oriented. Airway Examination: normal oropharyngeal                        airway and neck mobility. Respiratory Examination:                        clear to auscultation. CV Examination: normal.                        Prophylactic Antibiotics: The patient does not require  prophylactic antibiotics. Prior Anticoagulants: The                        patient has taken no previous anticoagulant or                       antiplatelet agents. ASA Grade Assessment: III - A                        patient with severe systemic disease. After reviewing                        the risks and benefits, the patient was deemed in                        satisfactory condition to undergo the procedure. The                        anesthesia plan was to use monitored anesthesia care                        (MAC). Immediately prior to administration of                        medications, the patient was re-assessed for adequacy                        to receive sedatives. The heart rate, respiratory rate,                        oxygen saturations, blood pressure, adequacy of                        pulmonary ventilation, and response to care were                        monitored throughout the procedure. The physical status                        of the patient was re-assessed after the procedure.                       After obtaining informed consent, the endoscope was                        passed under direct vision. Throughout the procedure,                        the patient's blood pressure, pulse, and oxygen                        saturations were monitored continuously. The Endoscope                        was introduced through the mouth, and advanced to the                        second part of duodenum. The upper GI endoscopy was  accomplished without difficulty. The patient tolerated                        the procedure fairly well. Findings:      The duodenal bulb and second portion of the duodenum were normal.       Biopsies for histology were taken with a cold forceps for evaluation of       celiac disease.      The entire examined stomach was normal. Biopsies were taken with a cold       forceps for Helicobacter pylori testing.      The cardia and gastric fundus were normal on retroflexion.      A small hiatal hernia was present.      The Z-line was irregular.       The examined esophagus was normal. Impression:           - Normal duodenal bulb and second portion of the                        duodenum. Biopsied.                       - Normal stomach. Biopsied.                       - Small hiatal hernia.                       - Z-line irregular.                       - Normal esophagus. Recommendation:       - Await pathology results.                       - Proceed with colonoscopy as scheduled                       See colonoscopy report Procedure Code(s):    --- Professional ---                       [phone], Esophagogastroduodenoscopy, flexible, transoral;                        with biopsy, single or multiple Diagnosis Code(s):    --- Professional ---                       K44.9, Diaphragmatic hernia without obstruction or                        gangrene                       K22.8, Other specified diseases of esophagus                       D50.9, Iron deficiency anemia, unspecified CPT copyright 2019 American Medical Association. All rights reserved. The codes documented in this report are preliminary and upon coder review may  be revised to meet current compliance requirements. Dr. Ulyess Mort Lin Landsman MD, MD 06/28/2019 8:55:22 AM This report has been signed electronically. Number of Addenda: 0 Note Initiated On: 06/28/2019 7:44 AM Total Procedure Duration: 0 hours 6  minutes 44 seconds  Estimated Blood Loss: Estimated blood loss: none.      [facility]

## 2019-06-28 NOTE — Op Note (Signed)
[facility] Gastroenterology Patient Name: Brian Dean Procedure Date: 06/28/2019 7:44 AM MRN: [id number] Account #: [id number] Date of Birth: [birth year] Admit Type: Outpatient Age: 63 Room: [facility] OR ROOM 01 Gender: Male Note Status: Finalized Procedure:            Colonoscopy Indications:          Last colonoscopy: August 2017, Unexplained iron                        deficiency anemia Providers:            Lin Landsman MD, MD Medicines:            Monitored Anesthesia Care Complications:        No immediate complications. Estimated blood loss: None. Procedure:            Pre-Anesthesia Assessment:                       - Prior to the procedure, a History and Physical was                        performed, and patient medications and allergies were                        reviewed. The patient is competent. The risks and                        benefits of the procedure and the sedation options and                        risks were discussed with the patient. All questions                        were answered and informed consent was obtained.                        Patient identification and proposed procedure were                        verified by the physician, the nurse, the                        anesthesiologist, the anesthetist and the technician in                        the pre-procedure area in the procedure room in the                        endoscopy suite. Mental Status Examination: alert and                        oriented. Airway Examination: normal oropharyngeal                        airway and neck mobility. Respiratory Examination:                        clear to auscultation. CV Examination: normal.                        Prophylactic  Antibiotics: The patient does not require                        prophylactic antibiotics. Prior Anticoagulants: The                        patient has taken no previous anticoagulant or     antiplatelet agents. ASA Grade Assessment: III - A                        patient with severe systemic disease. After reviewing                        the risks and benefits, the patient was deemed in                        satisfactory condition to undergo the procedure. The                        anesthesia plan was to use monitored anesthesia care                        (MAC). Immediately prior to administration of                        medications, the patient was re-assessed for adequacy                        to receive sedatives. The heart rate, respiratory rate,                        oxygen saturations, blood pressure, adequacy of                        pulmonary ventilation, and response to care were                        monitored throughout the procedure. The physical status                        of the patient was re-assessed after the procedure.                       After obtaining informed consent, the colonoscope was                        passed under direct vision. Throughout the procedure,                        the patient's blood pressure, pulse, and oxygen                        saturations were monitored continuously. The                        Colonoscope was introduced through the anus and                        advanced to the the terminal ileum, with identification  of the appendiceal orifice and IC valve. The                        colonoscopy was performed without difficulty. The                        patient tolerated the procedure well. The quality of                        the bowel preparation was good. Findings:      The terminal ileum appeared normal.      A 8 mm polyp was found in the transverse colon. The polyp was sessile.       The polyp was removed with a hot snare. Resection and retrieval were       complete.      Non-bleeding external and internal hemorrhoids were found during       retroflexion and during perianal  exam. The hemorrhoids were large. Impression:           - The examined portion of the ileum was normal.                       - One 8 mm polyp in the transverse colon, removed with                        a hot snare. Resected and retrieved.                       - Non-bleeding external and internal hemorrhoids. Recommendation:       - Discharge patient to home (with escort).                       - Resume previous diet today.                       - Continue present medications.                       - Await pathology results.                       - Repeat colonoscopy in 5 years for surveillance.                       - Return to my office in 2 weeks. Procedure Code(s):    --- Professional ---                       [phone], Colonoscopy, flexible; with removal of tumor(s),                        polyp(s), or other lesion(s) by snare technique Diagnosis Code(s):    --- Professional ---                       K64.8, Other hemorrhoids                       K63.5, Polyp of colon                       D50.9, Iron deficiency anemia, unspecified CPT copyright 2019 American  Medical Association. All rights reserved. The codes documented in this report are preliminary and upon coder review may  be revised to meet current compliance requirements. Dr. Ulyess Mort Lin Landsman MD, MD 06/28/2019 9:22:34 AM This report has been signed electronically. Number of Addenda: 0 Note Initiated On: 06/28/2019 7:44 AM Scope Withdrawal Time: 0 hours 18 minutes 54 seconds  Total Procedure Duration: 0 hours 22 minutes 45 seconds  Estimated Blood Loss: Estimated blood loss: none.      [facility]

## 2019-06-28 NOTE — Anesthesia Postprocedure Evaluation (Signed)
Anesthesia Post Note  Patient: Brian Dean  Procedure(s) Performed: ESOPHAGOGASTRODUODENOSCOPY (EGD) WITH PROPOFOL (N/A ) COLONOSCOPY WITH PROPOFOL (N/A ) POLYPECTOMY  Patient location during evaluation: PACU Anesthesia Type: General Level of consciousness: awake and alert and oriented Pain management: satisfactory to patient Vital Signs Assessment: post-procedure vital signs reviewed and stable Respiratory status: spontaneous breathing, nonlabored ventilation and respiratory function stable Cardiovascular status: blood pressure returned to baseline and stable Postop Assessment: Adequate PO intake and No signs of nausea or vomiting Anesthetic complications: no    Raliegh Ip

## 2019-06-29 ENCOUNTER — Encounter: Payer: Self-pay | Admitting: Gastroenterology

## 2019-07-05 ENCOUNTER — Telehealth: Payer: Self-pay | Admitting: Gastroenterology

## 2019-07-05 ENCOUNTER — Encounter: Payer: Self-pay | Admitting: Gastroenterology

## 2019-07-05 NOTE — Telephone Encounter (Signed)
Left vm to schedule banding #1 with Dr. Marius Ditch

## 2019-07-05 NOTE — Telephone Encounter (Signed)
-----   Message from Lin Landsman, MD sent at 06/28/2019  9:26 AM EDT ----- Regarding: hemorrhoid banding Schedule banding, 1st one in next 1-2weeks after confirming with patient  RV

## 2019-07-06 ENCOUNTER — Encounter: Payer: Self-pay | Admitting: Gastroenterology

## 2019-07-14 ENCOUNTER — Ambulatory Visit (INDEPENDENT_AMBULATORY_CARE_PROVIDER_SITE_OTHER): Payer: 59 | Admitting: Gastroenterology

## 2019-07-14 ENCOUNTER — Other Ambulatory Visit: Payer: Self-pay

## 2019-07-14 VITALS — BP 135/79 | HR 64 | Temp 97.1°F | Ht 72.0 in | Wt 166.4 lb

## 2019-07-14 DIAGNOSIS — K625 Hemorrhage of anus and rectum: Secondary | ICD-10-CM

## 2019-07-14 NOTE — Progress Notes (Deleted)
Cephas Darby, MD [address]  [place]  [place], [place] [address]  Main: [phone]  Fax: [phone] Pager: [phone]   Primary Care Physician: Hubbard Hartshorn, FNP  Primary Gastroenterologist:  Dr. Cephas Darby  Chief Complaint  Patient presents with  . Banding    HPI: Brian Dean is a 63 y.o. male is referred for management of rectal bleeding and IDA.  He has history of internal hemorrhoids, underwent hemorrhoid ligation during colonoscopy by Dr. Bary Castilla in 2017 for same complaint.  Patient reports that he has been noticing bright red blood per rectum on wiping only, it is occasional, occurs about once or twice a month.  He denies any other GI symptoms.  He denies constipation.  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  He is not taking iron supplementation.  He denies black stools, abdominal pain.  Follow up visit 05/31/19 He reports doing fairly well today.  He has been taking fusion plus daily requesting refill.  He continues to have intermittent scant bright red blood per rectum, occasional.  He denies any other complaints today.  - Normal duodenal bulb and second portion of the duodenum. Biopsied. - Normal stomach. Biopsied. - Small hiatal hernia. - Z-line irregular. - Normal esophagus.  - The examined portion of the ileum was normal. - One 8 mm polyp in the transverse colon, removed with a hot snare. Resected and retrieved. - Non-bleeding external and internal hemorrhoids.    Current Outpatient Medications  Medication Sig Dispense Refill  . aspirin 81 MG tablet Take 81 mg by mouth daily. Take 1 tablet (81 mg) by mouth once daily    . atorvastatin (LIPITOR) 20 MG tablet Take 1 tablet (20 mg total) by mouth daily. 90 tablet 3  . CINNAMON PO Take 1,000 mg by mouth 2 (two) times daily. Reported on 05/06/2016    .  finasteride (PROSCAR) 5 MG tablet Take 1 tablet (5 mg total) by mouth daily. 90 tablet 3  . gabapentin (NEURONTIN) 300 MG capsule TAKE 1 CAPSULE BY MOUTH EVERYDAY AT BEDTIME 90 capsule 3  . HYDROcodone-acetaminophen (NORCO/VICODIN) 5-325 MG tablet Take 1 tablet by mouth every 6 (six) hours as needed for moderate pain.    Marland Kitchen lisinopril (PRINIVIL,ZESTRIL) 30 MG tablet Take 1 tablet (30 mg total) by mouth daily. 90 tablet 3  . metFORMIN (GLUCOPHAGE) 500 MG tablet Take 1 tablet (500 mg total) by mouth 2 (two) times daily with a meal. 180 tablet 1  . nitroGLYCERIN (NITROSTAT) 0.4 MG SL tablet Place 1 tablet (0.4 mg total) under the tongue every 5 (five) minutes as needed. 25 tablet 3  . pantoprazole (PROTONIX) 40 MG tablet TAKE 1 TABLET BY MOUTH EVERY DAY 90 tablet 2  . tadalafil (CIALIS) 5 MG tablet Take 5 mg by mouth daily. Reported on 05/06/2016    . Vitamin D, Ergocalciferol, (DRISDOL) 1.25 MG (50000 UT) CAPS capsule Take 1 capsule (50,000 Units total) by mouth every 7 (seven) days. (Patient not taking: Reported on 05/31/2019) 4 capsule 1   No current facility-administered medications for this visit.     Allergies as of 07/14/2019  . (No Known Allergies)   NSAIDs: None  Antiplts/Anticoagulants/Anti thrombotics: Aspirin 81  GI Procedures:  Colonoscopy 06/22/2016 - Eight benign appearing 5 mm polyps in the rectum and at the recto-sigmoid colon. Biopsied. - Internal hemorrhoids. Bandedx5.  ROS:  General: Negative for anorexia, weight loss, fever, chills, fatigue, weakness. ENT: Negative for hoarseness, difficulty swallowing , nasal congestion. CV: Negative for chest pain, angina, palpitations, dyspnea on exertion, peripheral edema.  Respiratory: Negative for dyspnea at rest, dyspnea on exertion, cough, sputum, wheezing.  GI: See history of present illness. GU:  Negative for dysuria, hematuria, urinary incontinence, urinary frequency, nocturnal urination.  Endo: Negative for unusual  weight change.    Physical Examination:   BP 135/79   Pulse 64   Temp (!) 97.1 F (36.2 C)   Ht 6' (1.829 m)   Wt 166 lb 6.4 oz (75.5 kg)   BMI 22.57 kg/m   General: Well-nourished, well-developed in no acute distress.  Eyes: No icterus. Conjunctivae pink. Mouth: Oropharyngeal mucosa moist and pink , no lesions erythema or exudate. Lungs: Clear to auscultation bilaterally. Non-labored. Heart: Regular rate and rhythm, no murmurs rubs or gallops.  Abdomen: Bowel sounds are normal, nontender, nondistended, no hepatosplenomegaly or masses, no hernia , no rebound or guarding.   Extremities: No lower extremity edema. No clubbing or deformities. Neuro: Alert and oriented x 3.  Grossly intact. Skin: Warm and dry, no jaundice.   Psych: Alert and cooperative, normal mood and affect.   Imaging Studies: No results found.  Assessment and Plan:   GLENNIS RAUSEO is a 63 y.o. male with history of internal hemorrhoids status post banding, here for follow up of rectal bleeding and IDA  Iron deficiency anemia Discussed with him about upper endoscopy and colonoscopy and patient is agreeable Recheck labs today Continue fusion +1 pill daily  Rectal bleeding, minimal Will consider repeat hemorrhoid ligation based on the colonoscopy findings   Follow up in 3 months   Dr Sherri Sear, MD

## 2019-07-24 ENCOUNTER — Ambulatory Visit: Payer: 59 | Admitting: Gastroenterology

## 2019-07-24 ENCOUNTER — Other Ambulatory Visit: Payer: Self-pay

## 2019-07-24 VITALS — BP 118/65 | HR 67 | Temp 98.7°F | Ht 72.0 in | Wt 167.8 lb

## 2019-07-24 DIAGNOSIS — K641 Second degree hemorrhoids: Secondary | ICD-10-CM

## 2019-07-24 NOTE — Progress Notes (Signed)

## 2019-07-24 NOTE — Progress Notes (Signed)
Cephas Darby, MD [address]  [place]  [place], [place] [address]  Main: [phone]  Fax: [phone] Pager: [phone]   Primary Care Physician: Hubbard Hartshorn, FNP  Primary Gastroenterologist:  Dr. Cephas Darby  Chief Complaint  Patient presents with  . Banding    HPI: Brian Dean is a 63 y.o. male is referred for management of rectal bleeding and IDA.  He has history of internal hemorrhoids, underwent hemorrhoid ligation during colonoscopy by Dr. Bary Castilla in 2017 for same complaint.  Patient reports that he has been noticing bright red blood per rectum on wiping only, it is occasional, occurs about once or twice a month.  He denies any other GI symptoms.  He denies constipation.  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  He is not taking iron supplementation.  He denies black stools, abdominal pain.  Follow up visit 05/31/19 He reports doing fairly well today.  He has been taking fusion plus daily requesting refill.  He continues to have intermittent scant bright red blood per rectum, occasional.  He denies any other complaints today.  Follow-up visit 07/24/2019 Patient underwent EGD and colonoscopy which were unremarkable except for hemorrhoids.  He is here to discuss about hemorrhoid ligation   Current Outpatient Medications  Medication Sig Dispense Refill  . aspirin 81 MG tablet Take 81 mg by mouth daily. Take 1 tablet (81 mg) by mouth once daily    . atorvastatin (LIPITOR) 20 MG tablet Take 1 tablet (20 mg total) by mouth daily. 90 tablet 3  . CINNAMON PO Take 1,000 mg by mouth 2 (two) times daily. Reported on 05/06/2016    . finasteride (PROSCAR) 5 MG tablet Take 1 tablet (5 mg total) by mouth daily. 90 tablet 3  . gabapentin (NEURONTIN) 300 MG capsule TAKE 1 CAPSULE BY MOUTH EVERYDAY AT BEDTIME 90 capsule 3  .  HYDROcodone-acetaminophen (NORCO/VICODIN) 5-325 MG tablet Take 1 tablet by mouth every 6 (six) hours as needed for moderate pain.    Marland Kitchen lisinopril (PRINIVIL,ZESTRIL) 30 MG tablet Take 1 tablet (30 mg total) by mouth daily. 90 tablet 3  . metFORMIN (GLUCOPHAGE) 500 MG tablet Take 1 tablet (500 mg total) by mouth 2 (two) times daily with a meal. 180 tablet 1  . nitroGLYCERIN (NITROSTAT) 0.4 MG SL tablet Place 1 tablet (0.4 mg total) under the tongue every 5 (five) minutes as needed. 25 tablet 3  . pantoprazole (PROTONIX) 40 MG tablet TAKE 1 TABLET BY MOUTH EVERY DAY 90 tablet 2  . tadalafil (CIALIS) 5 MG tablet Take 5 mg by mouth daily. Reported on 05/06/2016    . Vitamin D, Ergocalciferol, (DRISDOL) 1.25 MG (50000 UT) CAPS capsule Take 1 capsule (50,000 Units total) by mouth every 7 (seven) days. (Patient not taking: Reported on 05/31/2019) 4 capsule 1   No current facility-administered medications for this visit.     Allergies as of 07/24/2019  . (No Known Allergies)   NSAIDs: None  Antiplts/Anticoagulants/Anti thrombotics: Aspirin 81  GI Procedures:  Colonoscopy 06/22/2016 - Eight benign appearing 5 mm polyps in the rectum and at the recto-sigmoid colon. Biopsied. - Internal hemorrhoids. Bandedx5.  EGD and colonoscopy 06/28/2019 - Normal duodenal bulb and second portion of the duodenum. Biopsied. - Normal stomach. Biopsied. - Small hiatal hernia. - Z-line irregular. - Normal esophagus.  - The examined portion of  the ileum was normal. - One 8 mm polyp in the transverse colon, removed with a hot snare. Resected and retrieved. - Non-bleeding external and internal hemorrhoids.    ROS:  General: Negative for anorexia, weight loss, fever, chills, fatigue, weakness. ENT: Negative for hoarseness, difficulty swallowing , nasal congestion. CV: Negative for chest pain, angina, palpitations, dyspnea on exertion, peripheral edema.  Respiratory: Negative for dyspnea at rest, dyspnea on  exertion, cough, sputum, wheezing.  GI: See history of present illness. GU:  Negative for dysuria, hematuria, urinary incontinence, urinary frequency, nocturnal urination.  Endo: Negative for unusual weight change.    Physical Examination:   BP 118/65   Pulse 67   Temp 98.7 F (37.1 C)   Ht 6' (1.829 m)   Wt 167 lb 12.8 oz (76.1 kg)   BMI 22.76 kg/m   General: Well-nourished, well-developed in no acute distress.  Eyes: No icterus. Conjunctivae pink. Mouth: Oropharyngeal mucosa moist and pink , no lesions erythema or exudate. Lungs: Clear to auscultation bilaterally. Non-labored. Heart: Regular rate and rhythm, no murmurs rubs or gallops.  Abdomen: Bowel sounds are normal, nontender, nondistended, no hepatosplenomegaly or masses, no hernia , no rebound or guarding.   Extremities: No lower extremity edema. No clubbing or deformities. Neuro: Alert and oriented x 3.  Grossly intact. Skin: Warm and dry, no jaundice.   Psych: Alert and cooperative, normal mood and affect.   Imaging Studies: Reviewed  Assessment and Plan:   Brian Dean is a 63 y.o. male with history of internal hemorrhoids status post banding, here for follow up of rectal bleeding and IDA  Iron deficiency anemia: Resolved Upper endoscopy and colonoscopy are unremarkable for iron deficiency anemia  Painless rectal bleeding from grade 2 internal hemorrhoids Discussed about risks and benefits of hemorrhoid ligation, consent obtained Perform hemorrhoid ligation today   Follow up in 2 weeks   Dr Sherri Sear, MD

## 2019-08-10 ENCOUNTER — Other Ambulatory Visit: Payer: Self-pay | Admitting: Family Medicine

## 2019-08-10 NOTE — Telephone Encounter (Signed)
Requested medication (s) are due for refill today: yes  Requested medication (s) are on the active medication list: yes  Last refill: 05/11/2019  Future visit scheduled: yes  Notes to clinic:  Review for refill Ordering provider is different than pcp   Requested Prescriptions  Pending Prescriptions Disp Refills   metFORMIN (GLUCOPHAGE) 500 MG tablet [Pharmacy Med Name: METFORMIN HCL 500 MG TABLET] 180 tablet 1    Sig: Take 1 tablet (500 mg total) by mouth 2 (two) times daily with a meal.     Endocrinology:  Diabetes - Biguanides Passed - 08/10/2019  1:33 AM      Passed - Cr in normal range and within 360 days    Creat  Date Value Ref Range Status  05/11/2019 0.87 0.70 - 1.25 mg/dL Final    Comment:    For patients >74 years of age, the reference limit for Creatinine is approximately 13% higher for people identified as African-American. .          Passed - HBA1C is between 0 and 7.9 and within 180 days    Hemoglobin A1C  Date Value Ref Range Status  07/09/2013 5.9 4.2 - 6.3 % Final    Comment:    The American Diabetes Association recommends that a primary goal of therapy should be <7% and that physicians should reevaluate the treatment regimen in patients with HbA1c values consistently >8%.    Hgb A1c MFr Bld  Date Value Ref Range Status  05/11/2019 5.7 (H) <5.7 % of total Hgb Final    Comment:    For someone without known diabetes, a hemoglobin  A1c value between 5.7% and 6.4% is consistent with prediabetes and should be confirmed with a  follow-up test. . For someone with known diabetes, a value <7% indicates that their diabetes is well controlled. A1c targets should be individualized based on duration of diabetes, age, comorbid conditions, and other considerations. . This assay result is consistent with an increased risk of diabetes. . Currently, no consensus exists regarding use of hemoglobin A1c for diagnosis of diabetes for children. .         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 [facility]  [place]   7 months ago Hospital discharge follow-up   [place]   7 months ago Pain and swelling of left lower extremity   [place]   9 months ago [facility] type 2 [facility]  [facility] [facility]   1 year ago [facility] with type 2 [facility]  [facility] [place]      Future Appointments            In 5 days [place], RoTally DueMD [place] In 3 months [place]Hubbard Hartshorn[place]  [place]

## 2019-08-11 NOTE — Telephone Encounter (Signed)
Patient has been notified

## 2019-08-15 ENCOUNTER — Other Ambulatory Visit: Payer: Self-pay

## 2019-08-15 ENCOUNTER — Encounter: Payer: Self-pay | Admitting: Gastroenterology

## 2019-08-15 ENCOUNTER — Ambulatory Visit: Payer: 59 | Admitting: Gastroenterology

## 2019-08-15 VITALS — BP 124/75 | HR 80 | Temp 98.6°F | Wt 165.2 lb

## 2019-08-15 DIAGNOSIS — K641 Second degree hemorrhoids: Secondary | ICD-10-CM | POA: Diagnosis not present

## 2019-08-15 NOTE — Progress Notes (Signed)

## 2019-08-29 ENCOUNTER — Ambulatory Visit: Payer: 59 | Admitting: Gastroenterology

## 2019-09-12 ENCOUNTER — Ambulatory Visit: Payer: 59 | Admitting: Gastroenterology

## 2019-09-12 ENCOUNTER — Encounter: Payer: Self-pay | Admitting: Gastroenterology

## 2019-09-12 ENCOUNTER — Other Ambulatory Visit: Payer: Self-pay

## 2019-09-12 VITALS — BP 191/83 | HR 71 | Temp 97.8°F | Resp 17 | Ht 72.0 in | Wt 169.2 lb

## 2019-09-12 DIAGNOSIS — K641 Second degree hemorrhoids: Secondary | ICD-10-CM | POA: Diagnosis not present

## 2019-09-12 NOTE — Progress Notes (Signed)
PROCEDURE NOTE: The patient presents with symptomatic grade 2 hemorrhoids, unresponsive to maximal medical therapy, requesting rubber band ligation of his/her hemorrhoidal disease.  All risks, benefits and alternative forms of therapy were described and informed consent was obtained.  The decision was made to band the LL internal hemorrhoid, and the [facility] System was used to perform band ligation without complication.  Digital anorectal examination was then performed to assure proper positioning of the band, and to adjust the banded tissue as required.  The patient was discharged home without pain or other issues.  Dietary and behavioral recommendations were given and (if necessary - prescriptions were given), along with follow-up instructions.  The patient will return 4 weeks for follow-up and possible additional banding as required.  No complications were encountered and the patient tolerated the procedure well.   

## 2019-10-12 ENCOUNTER — Other Ambulatory Visit: Payer: Self-pay

## 2019-10-12 ENCOUNTER — Ambulatory Visit: Payer: 59 | Admitting: Gastroenterology

## 2019-10-12 ENCOUNTER — Encounter: Payer: Self-pay | Admitting: Gastroenterology

## 2019-10-12 VITALS — BP 153/84 | HR 81 | Temp 97.4°F | Wt 170.2 lb

## 2019-10-12 DIAGNOSIS — K641 Second degree hemorrhoids: Secondary | ICD-10-CM | POA: Diagnosis not present

## 2019-10-12 NOTE — Progress Notes (Signed)
Cephas Darby, MD [address]  [place]  [place], [place] [address]  Main: [phone]  Fax: [phone] Pager: [phone]   Primary Care Physician: Hubbard Hartshorn, FNP  Primary Gastroenterologist:  Dr. Cephas Darby  Chief Complaint  Patient presents with  . Hemorrhoids    Patient states he feels better     HPI: Brian Dean is a 63 y.o. male is referred for management of rectal bleeding and IDA.  He has history of internal hemorrhoids, underwent hemorrhoid ligation during colonoscopy by Dr. Bary Castilla in 2017 for same complaint.  Patient reports that he has been noticing bright red blood per rectum on wiping only, it is occasional, occurs about once or twice a month.  He denies any other GI symptoms.  He denies constipation.  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  He is not taking iron supplementation.  He denies black stools, abdominal pain.  Follow up visit 05/31/19 He reports doing fairly well today.  He has been taking fusion plus daily requesting refill.  He continues to have intermittent scant bright red blood per rectum, occasional.  He denies any other complaints today.  Follow-up visit 07/24/2019 Patient underwent EGD and colonoscopy which were unremarkable except for hemorrhoids.  He is here to discuss about hemorrhoid ligation  Follow-up visit 10/12/2019 Patient underwent ligation of all 3 internal hemorrhoids.  He is no longer experiencing rectal bleeding.  Other than mild prolapse of the hemorrhoid, he is not experiencing any other hemorrhoidal symptoms.  He feels well.  He continues to smoke 4 to 5 cigarettes/day.   Current Outpatient Medications  Medication Sig Dispense Refill  . aspirin 81 MG tablet Take 81 mg by mouth daily. Take 1 tablet (81 mg) by mouth once daily    . atorvastatin (LIPITOR) 20 MG tablet Take 1  tablet (20 mg total) by mouth daily. 90 tablet 3  . CINNAMON PO Take 1,000 mg by mouth 2 (two) times daily. Reported on 05/06/2016    . finasteride (PROSCAR) 5 MG tablet Take 1 tablet (5 mg total) by mouth daily. 90 tablet 3  . gabapentin (NEURONTIN) 300 MG capsule TAKE 1 CAPSULE BY MOUTH EVERYDAY AT BEDTIME 90 capsule 3  . HYDROcodone-acetaminophen (NORCO/VICODIN) 5-325 MG tablet Take 1 tablet by mouth every 6 (six) hours as needed for moderate pain.    Marland Kitchen lisinopril (PRINIVIL,ZESTRIL) 30 MG tablet Take 1 tablet (30 mg total) by mouth daily. 90 tablet 3  . metFORMIN (GLUCOPHAGE) 500 MG tablet TAKE 1 TABLET (500 MG TOTAL) BY MOUTH 2 (TWO) TIMES DAILY WITH A MEAL. 180 tablet 1  . nitroGLYCERIN (NITROSTAT) 0.4 MG SL tablet Place 1 tablet (0.4 mg total) under the tongue every 5 (five) minutes as needed. 25 tablet 3  . pantoprazole (PROTONIX) 40 MG tablet TAKE 1 TABLET BY MOUTH EVERY DAY 90 tablet 2  . tadalafil (CIALIS) 5 MG tablet Take 5 mg by mouth daily. Reported on 05/06/2016    . Vitamin D, Ergocalciferol, (DRISDOL) 1.25 MG (50000 UT) CAPS capsule Take 1 capsule (50,000 Units total) by mouth every 7 (seven) days. 4 capsule 1   No current facility-administered medications for this visit.     Allergies as of 10/12/2019  . (No Known Allergies)   NSAIDs: None  Antiplts/Anticoagulants/Anti thrombotics: Aspirin 81  GI Procedures:  Colonoscopy 06/22/2016 - Eight benign appearing  5 mm polyps in the rectum and at the recto-sigmoid colon. Biopsied. - Internal hemorrhoids. Bandedx5.  EGD and colonoscopy 06/28/2019 - Normal duodenal bulb and second portion of the duodenum. Biopsied. - Normal stomach. Biopsied. - Small hiatal hernia. - Z-line irregular. - Normal esophagus.  - The examined portion of the ileum was normal. - One 8 mm polyp in the transverse colon, removed with a hot snare. Resected and retrieved. - Non-bleeding external and internal hemorrhoids.    ROS:  General: Negative  for anorexia, weight loss, fever, chills, fatigue, weakness. ENT: Negative for hoarseness, difficulty swallowing , nasal congestion. CV: Negative for chest pain, angina, palpitations, dyspnea on exertion, peripheral edema.  Respiratory: Negative for dyspnea at rest, dyspnea on exertion, cough, sputum, wheezing.  GI: See history of present illness. GU:  Negative for dysuria, hematuria, urinary incontinence, urinary frequency, nocturnal urination.  Endo: Negative for unusual weight change.    Physical Examination:   BP (!) 153/84 (BP Location: Left Arm, Patient Position: Sitting, Cuff Size: Normal)   Pulse 81   Temp (!) 97.4 F (36.3 C) (Oral)   Wt 170 lb 4 oz (77.2 kg)   BMI 23.09 kg/m   General: Well-nourished, well-developed in no acute distress.  Eyes: No icterus. Conjunctivae pink. Mouth: Oropharyngeal mucosa moist and pink , no lesions erythema or exudate. Lungs: Clear to auscultation bilaterally. Non-labored. Heart: Regular rate and rhythm, no murmurs rubs or gallops.  Abdomen: Bowel sounds are normal, nontender, nondistended, no hepatosplenomegaly or masses, no hernia , no rebound or guarding.   Extremities: No lower extremity edema. No clubbing or deformities. Neuro: Alert and oriented x 3.  Grossly intact. Skin: Warm and dry, no jaundice.   Psych: Alert and cooperative, normal mood and affect.   Imaging Studies: Reviewed  Assessment and Plan:   Brian Dean is a 63 y.o. male with history of internal hemorrhoids status post banding, here for follow up of rectal bleeding and IDA s/p ligation of right anterior, right posterior and left lateral hemorrhoids.  Iron deficiency anemia resolved bidirectional endoscopy was unremarkable. Rectal bleeding resolved  Follow up as needed   Dr Sherri Sear, MD

## 2019-10-25 ENCOUNTER — Other Ambulatory Visit: Payer: Self-pay | Admitting: Family Medicine

## 2019-10-27 ENCOUNTER — Other Ambulatory Visit: Payer: Self-pay | Admitting: Family Medicine

## 2019-11-13 ENCOUNTER — Ambulatory Visit: Payer: 59 | Admitting: Family Medicine

## 2019-11-17 ENCOUNTER — Ambulatory Visit: Payer: Self-pay | Admitting: Family Medicine

## 2019-11-20 ENCOUNTER — Other Ambulatory Visit: Payer: Self-pay

## 2019-11-20 ENCOUNTER — Encounter: Payer: Self-pay | Admitting: Family Medicine

## 2019-11-20 ENCOUNTER — Ambulatory Visit: Payer: 59 | Admitting: Family Medicine

## 2019-11-20 VITALS — BP 136/84 | HR 69 | Temp 98.2°F | Resp 18 | Ht 72.0 in | Wt 171.0 lb

## 2019-11-20 DIAGNOSIS — D649 Anemia, unspecified: Secondary | ICD-10-CM

## 2019-11-20 DIAGNOSIS — I2510 Atherosclerotic heart disease of native coronary artery without angina pectoris: Secondary | ICD-10-CM

## 2019-11-20 DIAGNOSIS — I5022 Chronic systolic (congestive) heart failure: Secondary | ICD-10-CM

## 2019-11-20 DIAGNOSIS — Z23 Encounter for immunization: Secondary | ICD-10-CM

## 2019-11-20 DIAGNOSIS — E1142 Type 2 diabetes mellitus with diabetic polyneuropathy: Secondary | ICD-10-CM

## 2019-11-20 DIAGNOSIS — E782 Mixed hyperlipidemia: Secondary | ICD-10-CM

## 2019-11-20 DIAGNOSIS — K219 Gastro-esophageal reflux disease without esophagitis: Secondary | ICD-10-CM

## 2019-11-20 DIAGNOSIS — M5431 Sciatica, right side: Secondary | ICD-10-CM

## 2019-11-20 DIAGNOSIS — Z89412 Acquired absence of left great toe: Secondary | ICD-10-CM

## 2019-11-20 DIAGNOSIS — E11628 Type 2 diabetes mellitus with other skin complications: Secondary | ICD-10-CM | POA: Diagnosis not present

## 2019-11-20 DIAGNOSIS — N529 Male erectile dysfunction, unspecified: Secondary | ICD-10-CM

## 2019-11-20 DIAGNOSIS — I119 Hypertensive heart disease without heart failure: Secondary | ICD-10-CM

## 2019-11-20 MED ORDER — TRAMADOL HCL 50 MG PO TABS: 50.0000 mg | ORAL_TABLET | Freq: Three times a day (TID) | ORAL | 0 refills | Status: DC | PRN

## 2019-11-20 NOTE — Progress Notes (Signed)
Name: Brian Dean   MRN: [id number]    DOB: 11-15-1955   Date:11/20/2019       Progress Note  Subjective  Chief Complaint  Chief Complaint  Patient presents with  . Diabetes    6 month recheck  . Hyperlipidemia  . Hypertension    HPI   LEFT Great Toe Amputation in February 2020 after acute osteomyelitis by Dr. Cleda Mccreedy.  He is doing well, balance has been fine.  No swelling or infection, no phantom pain, balance has been good.  No recent falls - no concerns today.  HLD: Taking atorvastatin - no chest pain, shortness of breath, or myalgias  Sciatica/Chronic Low Back Pain/DM Neuropathy: Taking gabapentin nightly and he has been generally well controlled, but did have a very small number of Vicodin that he had on hand to take just as needed - taking about 20 per year.  Ran out recently and we discussed opiate therapy - I am only willing to Rx tramadol #20 for the entire year and he is agreeable to this today.  [place] controlled substance database is reviewed and no suspicious findings.   HTN/CHF/CAD: Taking lisinopril 30mg  and doing well.  No chest pain, shortness of breath, no BLE edema or headaches.  Does not add salt at the table.  BP at goal today.  He had an MI in 2013 - seeing Dr. Rockey Situ annually (needs to schedule for this year).  Has not needed his nitro - reminded him to avoid nitro within 24 hours of taking cialis.  On statin, ACE-I, and 81mg  ASA daily.  BPH/ED: Taking cialis 5mg  daily, proscar 5mg .  No history of prostate cancer.  Had "cool thermo" procedure done per his report.  No issues with urination or ED since procedure. Seeing Dr. Rogers Blocker annually for PSA checks (last visit was November 2020).  No changes today.  IDA, Chronic Blood Loss from Hemorrhoids: Seeing Dr. Marius Ditch, who has him on an iron supplement. Had 3 hemorrhoids banded in November 2020 and no bleeding since then. No abdominal or rectal pain.  Will recheck anemia levels today.  Diabetes: Has been well  controlled for about 3-4 years now.  He is only taking Metformin 500mg  BID.  Denies polyphagia, polydipsia, or polyuria.  Due for A1C today.  He is taking ACE-I, statin, and Aspirin.  State eye exam is UTD, we will request records today.  Urine Micro is UTD.  Patient Active Problem List   Diagnosis Date Noted  . Onychomycosis of left great toe 01/03/2019  . Sciatica 10/27/2017  . Rectal bleeding 05/07/2016  . Internal hemorrhoids 05/07/2016  . Hyperlipidemia   . GERD (gastroesophageal reflux disease)   . Hypertensive heart disease   . Unspecified systolic (congestive) heart failure ([place])   . ED (erectile dysfunction)   . Diabetic neuropathy ([place])   . S/P coronary artery stent placement 09/25/2013  . CAD (coronary artery disease) 07/28/2013  . Diabetes mellitus type II, controlled ([place]) 07/28/2013  . Abnormal EKG 07/24/2013  . Smoker 07/24/2013  . Gallstone 07/17/2013  . Inguinal hernia 07/17/2013    Past Surgical History:  Procedure Laterality Date  . AMPUTATION Left 01/04/2019   Procedure: AMPUTATION RAY LEFT GREAT TOE;  Surgeon: Sharlotte Alamo, DPM;  Location: [facility];  Service: Podiatry;  Laterality: Left;  . CARDIAC CATHETERIZATION  11/14   [facility] x1  . [facility]ONOSCOPY  [facility]   [facility]  . COLONOSCOPY WITH PROPOFOL N/A 06/22/2016   Procedure: COLONOSCOPY WITH PROPOFOL;  Surgeon: Forest Gleason  Bary Castilla, MD;  Location: [facility];  Service: Endoscopy;  Laterality: N/A;  . COLONOSCOPY WITH PROPOFOL N/A 06/28/2019   Procedure: COLONOSCOPY WITH PROPOFOL;  Surgeon: Lin Landsman, MD;  Location: [place];  Service: Endoscopy;  Laterality: N/A;  Diabetic - oral meds  . ELBOW ARTHROSCOPY  2001   lt  . ESOPHAGOGASTRODUODENOSCOPY (EGD) WITH PROPOFOL N/A 06/28/2019   Procedure: ESOPHAGOGASTRODUODENOSCOPY (EGD) WITH PROPOFOL;  Surgeon: Lin Landsman, MD;  Location: [place];  Service: Endoscopy;  Laterality: N/A;  . GUM SURGERY    . OLECRANON BURSECTOMY  11/12/2011    Procedure: OLECRANON BURSA;  Surgeon: Linna Hoff;  Location: [place];  Service: Orthopedics;  Laterality: Right;  olecracnon bursectomy with delayed closure  . POLYPECTOMY  06/28/2019   Procedure: POLYPECTOMY;  Surgeon: Lin Landsman, MD;  Location: [place];  Service: Endoscopy;;  . WISDOM TOOTH EXTRACTION      Family History  Problem Relation Age of Onset  . Hypertension Father   . Diabetes Father   . Heart disease Father   . Diabetes Mother   . Heart disease Mother   . Hypertension Mother   . Cancer Mother        bladder  . Diabetes Sister   . Stroke Sister        mini stroke  . Diabetes Brother   . Diabetes Sister   . Diabetes Sister   . Diabetes Sister   . Diabetes Sister     Social History   Socioeconomic History  . Marital status: Married    Spouse name: Not on file  . Number of children: Not on file  . Years of education: Not on file  . Highest education level: Not on file  Occupational History  . Not on file  Tobacco Use  . Smoking status: Current Every Day Smoker    Packs/day: 0.50    Years: 47.00    Pack years: 23.50    Types: Cigarettes    Last attempt to quit: 11/09/2012    Years since quitting: 7.0  . Smokeless tobacco: Never Used  . Tobacco comment: down to 5 cigs/day  Substance and Sexual Activity  . Alcohol use: Yes    Alcohol/week: 0.0 standard drinks    Comment: occasional (3-4x/yr)  . Drug use: No  . Sexual activity: Yes    Birth control/protection: None  Other Topics Concern  . Not on file  Social History Narrative  . Not on file   Social Determinants of Health   Financial Resource Strain:   . Difficulty of Paying Living Expenses: Not on file  Food Insecurity:   . Worried About Charity fundraiser in the Last Year: Not on file  . Ran Out of Food in the Last Year: Not on file  Transportation Needs:   . Lack of Transportation (Medical): Not on file  . Lack of Transportation (Non-Medical): Not on  file  Physical Activity:   . Days of Exercise per Week: Not on file  . Minutes of Exercise per Session: Not on file  Stress:   . Feeling of Stress : Not on file  Social Connections:   . Frequency of Communication with Friends and Family: Not on file  . Frequency of Social Gatherings with Friends and Family: Not on file  . Attends Religious Services: Not on file  . Active Member of Clubs or Organizations: Not on file  . Attends Archivist Meetings: Not on file  .  Marital Status: Not on file  Intimate Partner Violence:   . Fear of Current or Ex-Partner: Not on file  . Emotionally Abused: Not on file  . Physically Abused: Not on file  . Sexually Abused: Not on file     Current Outpatient Medications:  .  aspirin 81 MG tablet, Take 81 mg by mouth daily. Take 1 tablet (81 mg) by mouth once daily, Disp: , Rfl:  .  CINNAMON PO, Take 1,000 mg by mouth 2 (two) times daily. Reported on 05/06/2016, Disp: , Rfl:  .  finasteride (PROSCAR) 5 MG tablet, Take 1 tablet (5 mg total) by mouth daily., Disp: 90 tablet, Rfl: 3 .  gabapentin (NEURONTIN) 300 MG capsule, TAKE 1 CAPSULE BY MOUTH EVERYDAY AT BEDTIME, Disp: 90 capsule, Rfl: 1 .  HYDROcodone-acetaminophen (NORCO/VICODIN) 5-325 MG tablet, Take 1 tablet by mouth every 6 (six) hours as needed for moderate pain., Disp: , Rfl:  .  lisinopril (ZESTRIL) 30 MG tablet, TAKE 1 TABLET BY MOUTH EVERY DAY, Disp: 90 tablet, Rfl: 3 .  metFORMIN (GLUCOPHAGE) 500 MG tablet, TAKE 1 TABLET (500 MG TOTAL) BY MOUTH 2 (TWO) TIMES DAILY WITH A MEAL., Disp: 180 tablet, Rfl: 1 .  nitroGLYCERIN (NITROSTAT) 0.4 MG SL tablet, Place 1 tablet (0.4 mg total) under the tongue every 5 (five) minutes as needed., Disp: 25 tablet, Rfl: 3 .  pantoprazole (PROTONIX) 40 MG tablet, TAKE 1 TABLET BY MOUTH EVERY DAY, Disp: 90 tablet, Rfl: 2 .  tadalafil (CIALIS) 5 MG tablet, Take 5 mg by mouth daily. Reported on 05/06/2016, Disp: , Rfl:  .  Vitamin D, Ergocalciferol, (DRISDOL)  1.25 MG (50000 UT) CAPS capsule, Take 1 capsule (50,000 Units total) by mouth every 7 (seven) days., Disp: 4 capsule, Rfl: 1 .  atorvastatin (LIPITOR) 20 MG tablet, Take 1 tablet (20 mg total) by mouth daily., Disp: 90 tablet, Rfl: 3  No Known Allergies  I personally reviewed active problem list, medication list, allergies, health maintenance, notes from last encounter, lab results with the patient/caregiver today.   ROS  Constitutional: Negative for fever or weight change.  Respiratory: Negative for cough and shortness of breath.   Cardiovascular: Negative for chest pain or palpitations.  Gastrointestinal: Negative for abdominal pain, no bowel changes.  Musculoskeletal: Negative for gait problem or joint swelling.  Skin: Negative for rash.  Neurological: Negative for dizziness or headache.  No other specific complaints in a complete review of systems (except as listed in HPI above).  Objective  Vitals:   11/20/19 0823  BP: 136/84  Pulse: 69  Resp: 18  Temp: 98.2 F (36.8 C)  TempSrc: Temporal  SpO2: 97%  Weight: 171 lb (77.6 kg)  Height: 6' (1.829 m)    Body mass index is 23.19 kg/m.  Physical Exam  Constitutional: Patient appears well-developed and well-nourished. No distress.  HENT: Head: Normocephalic and atraumatic. Ears: bilateral TMs with no erythema or effusion; Nose: Nose normal. Mouth/Throat: Oropharynx is clear and moist. No oropharyngeal exudate or tonsillar swelling.  Eyes: Conjunctivae and EOM are normal. No scleral icterus.  Pupils are equal, round, and reactive to light.  Neck: Normal range of motion. Neck supple. No JVD present. No thyromegaly present.  Cardiovascular: Normal rate, regular rhythm and normal heart sounds.  No murmur heard. No BLE edema. Pulmonary/Chest: Effort normal and breath sounds normal. No respiratory distress. Musculoskeletal: Normal range of motion, no joint effusions. No gross deformities Neurological: Pt is alert and oriented to  person, place, and time. No  cranial nerve deficit. Coordination, balance, strength, speech and gait are normal.  Skin: Skin is warm and dry. No rash noted. No erythema.  Psychiatric: Patient has a normal mood and affect. behavior is normal. Judgment and thought content normal.  No results found for this or any previous visit (from the past 72 hour(s)).  PHQ2/9: Depression screen [facility] 2/9 11/20/2019 05/11/2019 01/09/2019 01/03/2019 10/28/2018  Decreased Interest 0 0 0 0 0  Down, Depressed, Hopeless 0 0 0 0 0  PHQ - 2 Score 0 0 0 0 0  Altered sleeping 0 0 0 0 0  Tired, decreased energy 0 0 0 0 0  Change in appetite 0 0 0 0 0  Feeling bad or failure about yourself  0 0 0 0 0  Trouble concentrating 0 0 0 0 0  Moving slowly or fidgety/restless 0 0 0 0 0  Suicidal thoughts 0 0 0 0 0  PHQ-9 Score 0 0 0 0 0  Difficult doing work/chores Not difficult at all Not difficult at all Not difficult at all Not difficult at all Not difficult at all   PHQ-2/9 Result is negative.    Fall Risk: Fall Risk  11/20/2019 05/11/2019 01/09/2019 01/03/2019 10/28/2018  Falls in the past year? 1 0 0 0 0  Number falls in past yr: 1 0 0 0 0  Injury with Fall? 0 0 0 0 0  Follow up Falls evaluation completed Falls evaluation completed - Falls evaluation completed -   Assessment & Plan  1. Diabetic polyneuropathy associated with type 2 diabetes mellitus (HCC) - Gabapentin daily, doing well on this regimen  2. Left great toe amputee ([place]) - Stable; occasional pain.  - traMADol (ULTRAM) 50 MG tablet; Take 1 tablet (50 mg total) by mouth every 8 (eight) hours as needed for severe pain. #20 to last the year until 11/19/2020  Dispense: 20 tablet; Refill: 0  3. Controlled type 2 diabetes mellitus with other skin complication, without long-term current use of insulin (HCC) - Continue Metformin - COMPLETE METABOLIC PANEL WITH GFR - Hemoglobin A1c  4. Mixed hyperlipidemia - Statin therapy  5. Chronic systolic congestive heart  failure ([place]) - Needs to schedule cardiology follow up  6. Hypertensive heart disease without heart failure - Needs to schedule cardiology follow up - COMPLETE METABOLIC PANEL WITH GFR  7. Coronary artery disease involving native coronary artery of native heart without angina pectoris - Needs to schedule cardiology follow up  8. Sciatica of right side - Gabapentin daily. - traMADol (ULTRAM) 50 MG tablet; Take 1 tablet (50 mg total) by mouth every 8 (eight) hours as needed for severe pain. #20 to last the year until 11/19/2020  Dispense: 20 tablet; Refill: 0  9. Gastroesophageal reflux disease without esophagitis - Seeing Dr. Marius Ditch PRN, doing well on diet only.  10. Erectile dysfunction, unspecified erectile dysfunction type - Seeing Dr. Rogers Blocker for management; doing well  11. Needs flu shot - Per orders  12. Anemia, unspecified type - CBC with Differential/Platelet

## 2019-11-21 LAB — COMPLETE METABOLIC PANEL WITH GFR
AG Ratio: 1.8 (calc) (ref 1.0–2.5)
ALT: 25 U/L (ref 9–46)
AST: 27 U/L (ref 10–35)
Albumin: 4.5 g/dL (ref 3.6–5.1)
Alkaline phosphatase (APISO): 54 U/L (ref 35–144)
BUN: 15 mg/dL (ref 7–25)
CO2: 27 mmol/L (ref 20–32)
Calcium: 9.7 mg/dL (ref 8.6–10.3)
Chloride: 103 mmol/L (ref 98–110)
Creat: 0.9 mg/dL (ref 0.70–1.25)
GFR, Est African American: 105 mL/min/{1.73_m2} (ref >=60)
GFR, Est Non African American: 91 mL/min/{1.73_m2} (ref >=60)
Globulin: 2.5 g/dL (calc) (ref 1.9–3.7)
Glucose, Bld: 139 mg/dL — ABNORMAL HIGH (ref 65–99)
Potassium: 5.1 mmol/L (ref 3.5–5.3)
Sodium: 137 mmol/L (ref 135–146)
Total Bilirubin: 0.9 mg/dL (ref 0.2–1.2)
Total Protein: 7 g/dL (ref 6.1–8.1)

## 2019-11-21 LAB — CBC WITH DIFFERENTIAL/PLATELET
Absolute Monocytes: 606 cells/uL (ref 200–950)
Basophils Absolute: 51 cells/uL (ref 0–200)
Basophils Relative: 0.7 %
Eosinophils Absolute: 88 cells/uL (ref 15–500)
Eosinophils Relative: 1.2 %
HCT: 39.9 % (ref 38.5–50.0)
Hemoglobin: 13.6 g/dL (ref 13.2–17.1)
Lymphs Abs: 1686 cells/uL (ref 850–3900)
MCH: 31.1 pg (ref 27.0–33.0)
MCHC: 34.1 g/dL (ref 32.0–36.0)
MCV: 91.3 fL (ref 80.0–100.0)
MPV: 9.5 fL (ref 7.5–12.5)
Monocytes Relative: 8.3 %
Neutro Abs: 4869 cells/uL (ref 1500–7800)
Neutrophils Relative %: 66.7 %
Platelets: 224 10*3/uL (ref 140–400)
RBC: 4.37 10*6/uL (ref 4.20–5.80)
RDW: 11.6 % (ref 11.0–15.0)
Total Lymphocyte: 23.1 %
WBC: 7.3 10*3/uL (ref 3.8–10.8)

## 2019-11-21 LAB — HEMOGLOBIN A1C
Hgb A1c MFr Bld: 6.2 % of total Hgb — ABNORMAL HIGH (ref <5.7)
Mean Plasma Glucose: 131 (calc)
eAG (mmol/L): 7.3 (calc)

## 2019-11-24 DIAGNOSIS — M5412 Radiculopathy, cervical region: Secondary | ICD-10-CM | POA: Insufficient documentation

## 2019-12-18 NOTE — Progress Notes (Signed)
Continue me via and does not rebound with surgery cardiology Office Note  Date:  12/19/2019   ID:  Brian Dean, DOB 07/21/1956, MRN [id number]  PCP:  Hubbard Hartshorn, FNP   Chief Complaint  Patient presents with  . other    12 month follow up. Meds reviewed by the pt. verbally. "doing well."     HPI:  Brian Dean is a 64 y.o. 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  In the past ow heart beat,  asymptomatic   No regular exercise program   Lab work reviewed with him on today's visit Total chol 124/ LDL 64 A1C 6.2  EKG personally reviewed by myself on todays visit showed sinus bradycardia LAFB, ST and T wave abnormality  OTHER PAST MEDICAL HISTORY REVIEWED BY ME FOR TODAY'S VISIT: 2019 12/13/2017 EKG showed sinus bradycardia rate 49 bpm T wave abnormality lead III aVF Similar to previous EKG 2017  2014 09/25/2013 Stent placed to his proximal left circumflex 09/15/2013 Cardiac catheterization showed severe proximal left circumflex disease, 50% lesions in his LAD, 40% RCA disease. 07/26/2013 Previous Stress Myoview showed what appears to be a proximally occluded RCA with peri-infarct ischemia.  Left anterior descending and left circumflex territory appear intact with no perfusion abnormality. Ejection fraction 47% with wall motion abnormality in the inferior wall. 07/08/2013 - 07/09/2013 Hospitalized for acute pyelonephritis, acute on chronic low back pain, hyponatremia, diabetes, hypertension 07/07/2013 CT scan shows large  calcified stone in the gallbladder neck, enlarged prostate He denied having any symptoms from his gallbladder stones     PMH:   has a past medical history of Anemia, Arthritis, CAD (coronary artery disease) (Nov 2014), Diabetes mellitus without complication ([place]), Diabetic neuropathy ([place]), Dyslipidemia, ED (erectile dysfunction), GERD (gastroesophageal reflux disease), History of acute pyelonephritis (Aug. 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  PSH:    Past Surgical History:  Procedure Laterality Date  . AMPUTATION Left 01/04/2019   Procedure: AMPUTATION RAY LEFT GREAT TOE;  Surgeon: Sharlotte Alamo, DPM;  Location: [facility];  Service: Podiatry;  Laterality: Left;  . CARDIAC CATHETERIZATION  11/14   [facility] x1  . [facility]ONOSCOPY  2004   [facility]  . COLONOSCOPY WITH PROPOFOL N/A 06/22/2016   Procedure: COLONOSCOPY WITH PROPOFOL;  Surgeon: Robert Bellow, MD;  Location: [facility];  Service: Endoscopy;  Laterality: N/A;  . COLONOSCOPY WITH PROPOFOL N/A 06/28/2019   Procedure: COLONOSCOPY WITH PROPOFOL;  Surgeon: Lin Landsman, MD;  Location: [place];  Service: Endoscopy;  Laterality: N/A;  Diabetic - oral meds  . ELBOW ARTHROSCOPY  2001   lt  . ESOPHAGOGASTRODUODENOSCOPY (EGD) WITH PROPOFOL N/A 06/28/2019   Procedure: ESOPHAGOGASTRODUODENOSCOPY (EGD) WITH PROPOFOL;  Surgeon: Lin Landsman, MD;  Location: [place];  Service: Endoscopy;  Laterality: N/A;  . GUM SURGERY    . OLECRANON BURSECTOMY  11/12/2011   Procedure: OLECRANON BURSA;  Surgeon: Linna Hoff;  Location: [place];  Service: Orthopedics;  Laterality: Right;  olecracnon bursectomy with delayed closure  . POLYPECTOMY  06/28/2019   Procedure:  POLYPECTOMY;  Surgeon: Lin Landsman, MD;  Location: [place];  Service: Endoscopy;;  . WISDOM TOOTH EXTRACTION      Current Outpatient Medications  Medication Sig Dispense Refill  . aspirin 81 MG tablet Take 81 mg by mouth daily. Take 1 tablet (81 mg) by mouth once daily    . atorvastatin (LIPITOR) 20 MG tablet Take 1 tablet (20 mg total) by mouth daily. 90 tablet 3  . CINNAMON PO Take 1,000 mg by mouth 2 (two) times daily. Reported on 05/06/2016    . finasteride (PROSCAR) 5 MG tablet Take 1 tablet (5 mg total) by mouth daily. 90 tablet 3  . gabapentin (NEURONTIN) 300 MG capsule TAKE 1 CAPSULE BY MOUTH EVERYDAY AT BEDTIME 90 capsule 1  . lisinopril (ZESTRIL) 30 MG tablet TAKE 1 TABLET BY MOUTH EVERY DAY 90 tablet 3  . metFORMIN (GLUCOPHAGE) 500 MG tablet TAKE 1 TABLET (500 MG TOTAL) BY MOUTH 2 (TWO) TIMES DAILY WITH A MEAL. 180 tablet 1  . methocarbamol (ROBAXIN-750) 750 MG tablet Robaxin-750  750 mg tablet  Take 1 tablet 3 times a day by oral route.    . naproxen (NAPROSYN) 500 MG tablet naproxen 500 mg tablet  TAKE 1 TABLET BY MOUTH TWICE A DAY    . nitroGLYCERIN (NITROSTAT) 0.4 MG SL tablet Place 1 tablet (0.4 mg total) under the tongue every 5 (five) minutes as needed. 25 tablet 3  . pantoprazole (PROTONIX) 40 MG tablet TAKE 1 TABLET BY MOUTH EVERY DAY 90 tablet 2  . tadalafil (CIALIS) 5 MG tablet Take 5 mg by mouth daily. Reported on 05/06/2016    . traMADol (ULTRAM) 50 MG tablet Take 1 tablet (50 mg total) by mouth every 8 (eight) hours as needed for severe pain. #20 to last the year until 11/19/2020 20 tablet 0   No current facility-administered medications for this visit.     Allergies:   Patient has no known allergies.   Social History:  The patient  reports that he has been smoking cigarettes. He has a 23.50 pack-year smoking history. He has never used smokeless tobacco. He reports current alcohol use. He reports that he does not use drugs.   Family History:    family history includes Cancer in his mother; Diabetes in his brother, father, mother, sister, sister, sister, sister, and sister; Heart disease in his father and mother; Hypertension in his father and mother; Stroke in his sister.    Review of Systems: Review of Systems  Constitutional: Negative.   Respiratory: Negative.   Cardiovascular: Negative.   Gastrointestinal: Negative.   Musculoskeletal: Negative.   Neurological: Negative.   Psychiatric/Behavioral: Negative.   All other systems reviewed and are negative.   PHYSICAL EXAM: VS:  BP (!) 150/72 (BP Location: Left Arm, Patient Position: Sitting, Cuff Size: Normal)   Pulse 60   Ht 6' (1.829 m)   Wt 174 lb 4 oz (79 kg)   BMI 23.63 kg/m  , BMI Body mass index is 23.63 kg/m.  Constitutional:  oriented to person, place, and time. No distress.  HENT:  Head: Grossly normal Eyes:  no discharge. No scleral icterus.  Neck: No JVD, no carotid bruits  Cardiovascular: Regular rate and rhythm, no murmurs appreciated Pulmonary/Chest: Clear to auscultation bilaterally, no wheezes or rails Abdominal: Soft.  no distension.  no tenderness.  Musculoskeletal: Normal range of motion Neurological:  normal muscle tone. Coordination normal. No atrophy Skin: Skin warm and dry Psychiatric: normal affect, pleasant  Recent Labs: 01/09/2019: Magnesium 1.7 11/20/2019: ALT 25; BUN 15; Creat 0.90; Hemoglobin 13.6; Platelets 224; Potassium 5.1; Sodium 137    Lipid Panel Lab Results  Component Value Date   CHOL 124 05/11/2019   HDL 45 05/11/2019   LDLCALC 64 05/11/2019   TRIG 74 05/11/2019     WEIGHT LOSS: Wt Readings from Last 3 Encounters:  12/19/19 174 lb 4 oz (79 kg)  11/20/19 171 lb (77.6 kg)  10/12/19 170 lb 4 oz (77.2 kg)      ASSESSMENT AND PLAN:  Hypertensive heart disease without heart failure Blood pressure is well controlled on today's visit. No changes made to the medications.  Coronary artery disease involving native  coronary artery of native heart without angina pectoris Currently with no symptoms of angina. No further workup at this time. Continue current medication regimen.  Mixed hyperlipidemia Cholesterol is at goal on the current lipid regimen. No changes to the medications were made.  Type 2 diabetes mellitus without complication, without long-term current use of insulin (HCC) Plan: A1C 5.6, stable  Smoker We have encouraged him to continue to work on weaning his cigarettes and smoking cessation. He will continue to work on this and does not want any assistance with chantix.   S/P coronary artery stent placement No further testing   Bradycardia  No symptoms  Total encounter time more than 25 minutes Greater than 50% was spent in counseling and coordination of care with the patient  Disposition:   F/U  12 months    Signed, Esmond Plants, M.D., Ph.D. 12/19/2019  [facility] [place], [place] [phone]

## 2019-12-19 ENCOUNTER — Other Ambulatory Visit: Payer: Self-pay

## 2019-12-19 ENCOUNTER — Ambulatory Visit (INDEPENDENT_AMBULATORY_CARE_PROVIDER_SITE_OTHER): Payer: 59 | Admitting: Cardiovascular Disease

## 2019-12-19 ENCOUNTER — Encounter: Payer: Self-pay | Admitting: Cardiovascular Disease

## 2019-12-19 VITALS — BP 150/72 | HR 60 | Ht 72.0 in | Wt 174.2 lb

## 2019-12-19 DIAGNOSIS — F17200 Nicotine dependence, unspecified, uncomplicated: Secondary | ICD-10-CM | POA: Diagnosis not present

## 2019-12-19 DIAGNOSIS — E119 Type 2 diabetes mellitus without complications: Secondary | ICD-10-CM

## 2019-12-19 DIAGNOSIS — I119 Hypertensive heart disease without heart failure: Secondary | ICD-10-CM

## 2019-12-19 DIAGNOSIS — E782 Mixed hyperlipidemia: Secondary | ICD-10-CM

## 2019-12-19 DIAGNOSIS — I25118 Atherosclerotic heart disease of native coronary artery with other forms of angina pectoris: Secondary | ICD-10-CM | POA: Diagnosis not present

## 2019-12-19 NOTE — Patient Instructions (Signed)

## 2020-01-27 ENCOUNTER — Other Ambulatory Visit: Payer: Self-pay | Admitting: Cardiovascular Disease

## 2020-01-29 ENCOUNTER — Other Ambulatory Visit: Payer: Self-pay | Admitting: Family Medicine

## 2020-01-29 ENCOUNTER — Other Ambulatory Visit: Payer: Self-pay | Admitting: Cardiovascular Disease

## 2020-05-20 ENCOUNTER — Other Ambulatory Visit: Payer: Self-pay

## 2020-05-20 ENCOUNTER — Ambulatory Visit: Payer: 59 | Admitting: Family Medicine

## 2020-05-20 ENCOUNTER — Encounter: Payer: Self-pay | Admitting: Family Medicine

## 2020-05-20 VITALS — BP 112/68 | HR 74 | Temp 97.4°F | Resp 18 | Ht 72.0 in | Wt 169.4 lb

## 2020-05-20 DIAGNOSIS — E1142 Type 2 diabetes mellitus with diabetic polyneuropathy: Secondary | ICD-10-CM

## 2020-05-20 DIAGNOSIS — I2510 Atherosclerotic heart disease of native coronary artery without angina pectoris: Secondary | ICD-10-CM

## 2020-05-20 DIAGNOSIS — E782 Mixed hyperlipidemia: Secondary | ICD-10-CM

## 2020-05-20 DIAGNOSIS — I10 Essential (primary) hypertension: Secondary | ICD-10-CM | POA: Diagnosis not present

## 2020-05-20 DIAGNOSIS — E11628 Type 2 diabetes mellitus with other skin complications: Secondary | ICD-10-CM

## 2020-05-20 DIAGNOSIS — K219 Gastro-esophageal reflux disease without esophagitis: Secondary | ICD-10-CM

## 2020-05-20 DIAGNOSIS — E875 Hyperkalemia: Secondary | ICD-10-CM

## 2020-05-20 DIAGNOSIS — I5022 Chronic systolic (congestive) heart failure: Secondary | ICD-10-CM

## 2020-05-20 DIAGNOSIS — F17200 Nicotine dependence, unspecified, uncomplicated: Secondary | ICD-10-CM

## 2020-05-20 MED ORDER — LISINOPRIL 30 MG PO TABS: 30.0000 mg | ORAL_TABLET | Freq: Every day | ORAL | 3 refills | Status: DC

## 2020-05-20 MED ORDER — GABAPENTIN 300 MG PO CAPS: 300.0000 mg | ORAL_CAPSULE | Freq: Every day | ORAL | 3 refills | Status: DC

## 2020-05-20 MED ORDER — METFORMIN HCL 500 MG PO TABS: 500.0000 mg | ORAL_TABLET | Freq: Two times a day (BID) | ORAL | 3 refills | Status: DC

## 2020-05-20 MED ORDER — ATORVASTATIN CALCIUM 20 MG PO TABS: 20.0000 mg | ORAL_TABLET | Freq: Every day | ORAL | 3 refills | Status: DC

## 2020-05-20 NOTE — Progress Notes (Signed)
Name: Brian Dean   MRN: [id number]    DOB: 1956-03-29   Date:05/20/2020       Progress Note  Chief Complaint  Patient presents with  . Follow-up  . Diabetes  . Hyperlipidemia     Subjective:   Brian Dean is a 64 y.o. male, presents to clinic for routine f/up on dm/hld   HTN/CHF/CAD on lisinopril 30 mg daily Pt reports good med compliance and denies any SE.   Blood pressure today is well controlled. BP Readings from Last 3 Encounters:  05/20/20 112/68  12/19/19 (!) 150/72  11/20/19 136/84   Pt denies CP, SOB, exertional sx, LE edema, palpitation, Ha's, visual disturbances, lightheadedness, hypotension, syncope. He sees cardiology once a year   DM - well controlled for several years on metformin 500 mg BID Blood sugars - doesn't check  Denies: Polyuria, polydipsia, vision changes, neuropathy, hypoglycemia Recent pertinent labs: Lab Results  Component Value Date   HGBA1C 6.2 (H) 11/20/2019   HGBA1C 5.7 (H) 05/11/2019   HGBA1C 5.6 10/28/2018   Standard of care and health maintenance: Foot exam:  Done today left great toe amputation, fungal nail disease, neuropathy, some nails long, pt does not want a podiatry referral DM eye exam:  Last year ACEI/ARB:  On  Eye exam:  due  GERD:  Sees GI on protonix, no sx today unless he eats late at night  IDA:  None - last labs good none needed today  Hyperlipidemia: Currently treated with lipitor, pt reports good med compliance Last Lipids: Lab Results  Component Value Date   CHOL 124 05/11/2019   HDL 45 05/11/2019   LDLCALC 64 05/11/2019   TRIG 74 05/11/2019   CHOLHDL 2.8 05/11/2019  - Denies: Chest pain, shortness of breath, myalgias, claudication  Current smoker - cigarettes at least 5-6 per day, smoked heavier for more than 30 years. Pt qualifies for lung cancer screening CT but is not interested. Smoking cessation instruction/counseling given:  counseled patient on the dangers of tobacco use, advised  patient to stop smoking, and reviewed strategies to maximize success  Pt denies any chronic cough, DOE, wheeze, recurrent bronchitis    Current Outpatient Medications:  .  aspirin 81 MG tablet, Take 81 mg by mouth daily. Take 1 tablet (81 mg) by mouth once daily, Disp: , Rfl:  .  atorvastatin (LIPITOR) 20 MG tablet, TAKE 1 TABLET BY MOUTH EVERY DAY, Disp: 90 tablet, Rfl: 3 .  CINNAMON PO, Take 1,000 mg by mouth 2 (two) times daily. Reported on 05/06/2016, Disp: , Rfl:  .  finasteride (PROSCAR) 5 MG tablet, Take 1 tablet (5 mg total) by mouth daily., Disp: 90 tablet, Rfl: 3 .  gabapentin (NEURONTIN) 300 MG capsule, TAKE 1 CAPSULE BY MOUTH EVERYDAY AT BEDTIME, Disp: 90 capsule, Rfl: 1 .  lisinopril (ZESTRIL) 30 MG tablet, TAKE 1 TABLET BY MOUTH EVERY DAY, Disp: 90 tablet, Rfl: 3 .  metFORMIN (GLUCOPHAGE) 500 MG tablet, TAKE 1 TABLET (500 MG TOTAL) BY MOUTH 2 (TWO) TIMES DAILY WITH A MEAL., Disp: 180 tablet, Rfl: 1 .  methocarbamol (ROBAXIN-750) 750 MG tablet, Robaxin-750  750 mg tablet  Take 1 tablet 3 times a day by oral route., Disp: , Rfl:  .  naproxen (NAPROSYN) 500 MG tablet, naproxen 500 mg tablet  TAKE 1 TABLET BY MOUTH TWICE A DAY, Disp: , Rfl:  .  pantoprazole (PROTONIX) 40 MG tablet, TAKE 1 TABLET BY MOUTH EVERY DAY, Disp: 90 tablet, Rfl: 2 .  tadalafil (CIALIS) 5 MG tablet, Take 5 mg by mouth daily. Reported on 05/06/2016, Disp: , Rfl:  .  traMADol (ULTRAM) 50 MG tablet, Take 1 tablet (50 mg total) by mouth every 8 (eight) hours as needed for severe pain. #20 to last the year until 11/19/2020, Disp: 20 tablet, Rfl: 0 .  nitroGLYCERIN (NITROSTAT) 0.4 MG SL tablet, Place 1 tablet (0.4 mg total) under the tongue every 5 (five) minutes as needed. (Patient not taking: Reported on 05/20/2020), Disp: 25 tablet, Rfl: 3  Patient Active Problem List   Diagnosis Date Noted  . Onychomycosis of left great toe 01/03/2019  . Sciatica 10/27/2017  . Rectal bleeding 05/07/2016  . Internal hemorrhoids  05/07/2016  . Hyperlipidemia   . GERD (gastroesophageal reflux disease)   . Hypertensive heart disease   . Unspecified systolic (congestive) heart failure ([place])   . ED (erectile dysfunction)   . Diabetic neuropathy ([place])   . S/P coronary artery stent placement 09/25/2013  . CAD (coronary artery disease) 07/28/2013  . Diabetes mellitus type II, controlled ([place]) 07/28/2013  . Abnormal EKG 07/24/2013  . Smoker 07/24/2013  . Gallstone 07/17/2013  . Inguinal hernia 07/17/2013    Past Surgical History:  Procedure Laterality Date  . AMPUTATION Left 01/04/2019   Procedure: AMPUTATION RAY LEFT GREAT TOE;  Surgeon: Sharlotte Alamo, DPM;  Location: [facility];  Service: Podiatry;  Laterality: Left;  . CARDIAC CATHETERIZATION  11/14   [facility] x1  . COLONOSCOPY  2004   [facility]  . COLONOSCOPY WITH PROPOFOL N/A 06/22/2016   Procedure: COLONOSCOPY WITH PROPOFOL;  Surgeon: Robert Bellow, MD;  Location: [facility] ENDOSCOPY;  Service: Endoscopy;  Laterality: N/A;  . COLONOSCOPY WITH PROPOFOL N/A 06/28/2019   Procedure: COLONOSCOPY WITH PROPOFOL;  Surgeon: Lin Landsman, MD;  Location: [place];  Service: Endoscopy;  Laterality: N/A;  Diabetic - oral meds  . ELBOW ARTHROSCOPY  2001   lt  . ESOPHAGOGASTRODUODENOSCOPY (EGD) WITH PROPOFOL N/A 06/28/2019   Procedure: ESOPHAGOGASTRODUODENOSCOPY (EGD) WITH PROPOFOL;  Surgeon: Lin Landsman, MD;  Location: [place];  Service: Endoscopy;  Laterality: N/A;  . GUM SURGERY    . OLECRANON BURSECTOMY  11/12/2011   Procedure: OLECRANON BURSA;  Surgeon: Linna Hoff;  Location: [place];  Service: Orthopedics;  Laterality: Right;  olecracnon bursectomy with delayed closure  . POLYPECTOMY  06/28/2019   Procedure: POLYPECTOMY;  Surgeon: Lin Landsman, MD;  Location: [place];  Service: Endoscopy;;  . WISDOM TOOTH EXTRACTION      Family History  Problem Relation Age of Onset  . Hypertension Father   . Diabetes  Father   . Heart disease Father   . Diabetes Mother   . Heart disease Mother   . Hypertension Mother   . Cancer Mother        bladder  . Diabetes Sister   . Stroke Sister        mini stroke  . Diabetes Brother   . Diabetes Sister   . Diabetes Sister   . Diabetes Sister   . Diabetes Sister     Social History   Tobacco Use  . Smoking status: Current Every Day Smoker    Packs/day: 0.50    Years: 47.00    Pack years: 23.50    Types: Cigarettes    Last attempt to quit: 11/09/2012    Years since quitting: 7.5  . Smokeless tobacco: Never Used  . Tobacco comment: down to 5 cigs/day  Vaping  Use  . Vaping Use: Some days  Substance Use Topics  . Alcohol use: Yes    Alcohol/week: 0.0 standard drinks    Comment: occasional (3-4x/yr)  . Drug use: No     No Known Allergies  Health Maintenance  Topic Date Due  . FOOT EXAM  10/29/2019  . OPHTHALMOLOGY EXAM  04/24/2020  . HEMOGLOBIN A1C  05/19/2020  . COVID-19 Vaccine (1) 06/05/2020 (Originally 09/02/1968)  . INFLUENZA VACCINE  06/09/2020  . COLONOSCOPY  06/27/2024  . TETANUS/TDAP  01/03/2029  . PNEUMOCOCCAL POLYSACCHARIDE VACCINE AGE 33-64 HIGH RISK  Completed  . Hepatitis C Screening  Completed  . HIV Screening  Completed    Chart Review Today: I personally reviewed active problem list, medication list, allergies, family history, social history, health maintenance, notes from last encounter, lab results, imaging with the patient/caregiver today.   Review of Systems  10 Systems reviewed and are negative for acute change except as noted in the HPI.  Objective:   Vitals:   05/20/20 1114  BP: 112/68  Pulse: 74  Resp: 18  Temp: (!) 97.4 F (36.3 C)  TempSrc: Temporal  SpO2: 99%  Weight: 169 lb 6.4 oz (76.8 kg)  Height: 6' (1.829 m)    Body mass index is 22.97 kg/m.  Physical Exam Vitals and nursing note reviewed.  Constitutional:      General: He is not in acute distress.    Appearance: Normal appearance. He  is well-developed. He is not ill-appearing, toxic-appearing or diaphoretic.     Interventions: Face mask in place.  HENT:     Head: Normocephalic and atraumatic.     Jaw: No trismus.     Right Ear: External ear normal.     Left Ear: External ear normal.  Eyes:     General: Lids are normal. No scleral icterus.    Conjunctiva/sclera: Conjunctivae normal.     Pupils: Pupils are equal, round, and reactive to light.  Neck:     Trachea: Trachea and phonation normal. No tracheal deviation.  Cardiovascular:     Rate and Rhythm: Normal rate and regular rhythm.     Pulses: Normal pulses.          Radial pulses are 2+ on the right side and 2+ on the left side.       Dorsalis pedis pulses are 2+ on the right side and 2+ on the left side.       Posterior tibial pulses are 2+ on the right side and 2+ on the left side.     Heart sounds: Normal heart sounds. No murmur heard.  No friction rub. No gallop.   Pulmonary:     Effort: Pulmonary effort is normal. No respiratory distress.     Breath sounds: Normal breath sounds. No stridor. No wheezing, rhonchi or rales.  Abdominal:     General: Bowel sounds are normal. There is no distension.     Palpations: Abdomen is soft.     Tenderness: There is no abdominal tenderness. There is no guarding or rebound.  Musculoskeletal:     Cervical back: Normal range of motion.     Right lower leg: No edema.     Left lower leg: No edema.  Feet:     Right foot:     Skin integrity: No ulcer, blister, skin breakdown, erythema or warmth.     Toenail Condition: Right toenails are abnormally thick and long. Fungal disease present.    Left foot:  Skin integrity: No ulcer, blister, skin breakdown, erythema or warmth.     Toenail Condition: Left toenails are abnormally thick and long. Fungal disease present.    Comments: Left great toe amputated Skin:    General: Skin is warm and dry.     Coloration: Skin is not jaundiced.     Findings: No rash.     Nails: There  is no clubbing.  Neurological:     Mental Status: He is alert.     Cranial Nerves: No dysarthria or facial asymmetry.     Motor: No tremor or abnormal muscle tone.     Gait: Gait normal.  Psychiatric:        Mood and Affect: Mood normal.        Speech: Speech normal.        Behavior: Behavior normal. Behavior is cooperative.         Assessment & Plan:     ICD-10-CM   1. Diabetic polyneuropathy associated with type 2 diabetes mellitus (HCC)  S92.33 COMPLETE METABOLIC PANEL WITH GFR    Hemoglobin A1c    lisinopril (ZESTRIL) 30 MG tablet    atorvastatin (LIPITOR) 20 MG tablet    metFORMIN (GLUCOPHAGE) 500 MG tablet   pt declined podiatry referral, DM has been well controlled in the past, compliant with metformin, recheck labs, needs eye exam  2. Controlled type 2 diabetes mellitus with other skin complication, without long-term current use of insulin (HCC)  A07.622 COMPLETE METABOLIC PANEL WITH GFR    Hemoglobin A1c    lisinopril (ZESTRIL) 30 MG tablet    atorvastatin (LIPITOR) 20 MG tablet    metFORMIN (GLUCOPHAGE) 500 MG tablet   see above  3. Mixed hyperlipidemia  Q33.3 COMPLETE METABOLIC PANEL WITH GFR    Lipid panel    atorvastatin (LIPITOR) 20 MG tablet   compliant with statin, no SE or concerns, due for lipid panel and CMP  4. Hypertension, unspecified type  L45 COMPLETE METABOLIC PANEL WITH GFR    lisinopril (ZESTRIL) 30 MG tablet   BP well controlled today, pt compliant with lisinopril  5. Chronic systolic congestive heart failure (HCC)  I50.22    per cardiology, sees once a year, currently no sx concerning for CHF, pt appears euvolemic  6. Gastroesophageal reflux disease without esophagitis  K21.9    well controlled on protonix  7. Coronary artery disease involving native coronary artery of native heart without angina pectoris  I25.10    CAD s/p PCI, no current angina or sx concerning for ACS - he is compliant with his annual cardiology f/up  8. Smoker  F17.200     discussed today, pt declines any lung Ca screening CT/program, no desire to quit, denies any sx of COPD   Med refills sent in due to change in provider.  Return in about 6 months (around 11/20/2020) for Routine follow-up.   Delsa Grana, PA-C 05/20/20 11:31 AM

## 2020-05-21 ENCOUNTER — Telehealth: Payer: Self-pay

## 2020-05-21 LAB — BASIC METABOLIC PANEL
BUN: 19 mg/dL (ref 7–25)
CO2: 26 mmol/L (ref 20–32)
Calcium: 9.3 mg/dL (ref 8.6–10.3)
Chloride: 103 mmol/L (ref 98–110)
Creat: 1.13 mg/dL (ref 0.70–1.25)
Glucose, Bld: 144 mg/dL — ABNORMAL HIGH (ref 65–99)
Potassium: 4.9 mmol/L (ref 3.5–5.3)
Sodium: 136 mmol/L (ref 135–146)

## 2020-05-21 LAB — COMPLETE METABOLIC PANEL WITH GFR
AG Ratio: 1.6 (calc) (ref 1.0–2.5)
ALT: 27 U/L (ref 9–46)
AST: 25 U/L (ref 10–35)
Albumin: 4.4 g/dL (ref 3.6–5.1)
Alkaline phosphatase (APISO): 61 U/L (ref 35–144)
BUN: 20 mg/dL (ref 7–25)
CO2: 27 mmol/L (ref 20–32)
Calcium: 10.1 mg/dL (ref 8.6–10.3)
Chloride: 104 mmol/L (ref 98–110)
Creat: 1.14 mg/dL (ref 0.70–1.25)
GFR, Est African American: 79 mL/min/{1.73_m2} (ref >=60)
GFR, Est Non African American: 68 mL/min/{1.73_m2} (ref >=60)
Globulin: 2.7 g/dL (calc) (ref 1.9–3.7)
Glucose, Bld: 120 mg/dL — ABNORMAL HIGH (ref 65–99)
Potassium: 6.5 mmol/L (ref 3.5–5.3)
Sodium: 135 mmol/L (ref 135–146)
Total Bilirubin: 0.7 mg/dL (ref 0.2–1.2)
Total Protein: 7.1 g/dL (ref 6.1–8.1)

## 2020-05-21 LAB — HEMOGLOBIN A1C
Hgb A1c MFr Bld: 6.2 % of total Hgb — ABNORMAL HIGH (ref <5.7)
Mean Plasma Glucose: 131 (calc)
eAG (mmol/L): 7.3 (calc)

## 2020-05-21 LAB — LIPID PANEL
Cholesterol: 128 mg/dL (ref <200)
HDL: 39 mg/dL — ABNORMAL LOW (ref >=40)
LDL Cholesterol (Calc): 69 mg/dL (calc)
Non-HDL Cholesterol (Calc): 89 mg/dL (calc) (ref <130)
Total CHOL/HDL Ratio: 3.3 (calc) (ref <5.0)
Triglycerides: 112 mg/dL (ref <150)

## 2020-05-21 NOTE — Addendum Note (Signed)
Addended by: Delsa Grana on: 05/21/2020 09:10 AM   Modules accepted: Orders

## 2020-05-21 NOTE — Telephone Encounter (Signed)
Call report high potassium 6.5

## 2020-06-16 IMAGING — CR DG ANKLE COMPLETE 3+V*L*
3 series · 3 of 3 positions shown · non-contrast
Comparison: None.

CLINICAL DATA: Swelling. Diabetic.

EXAM:
LEFT ANKLE COMPLETE - 3+ VIEW

[ankle ap]
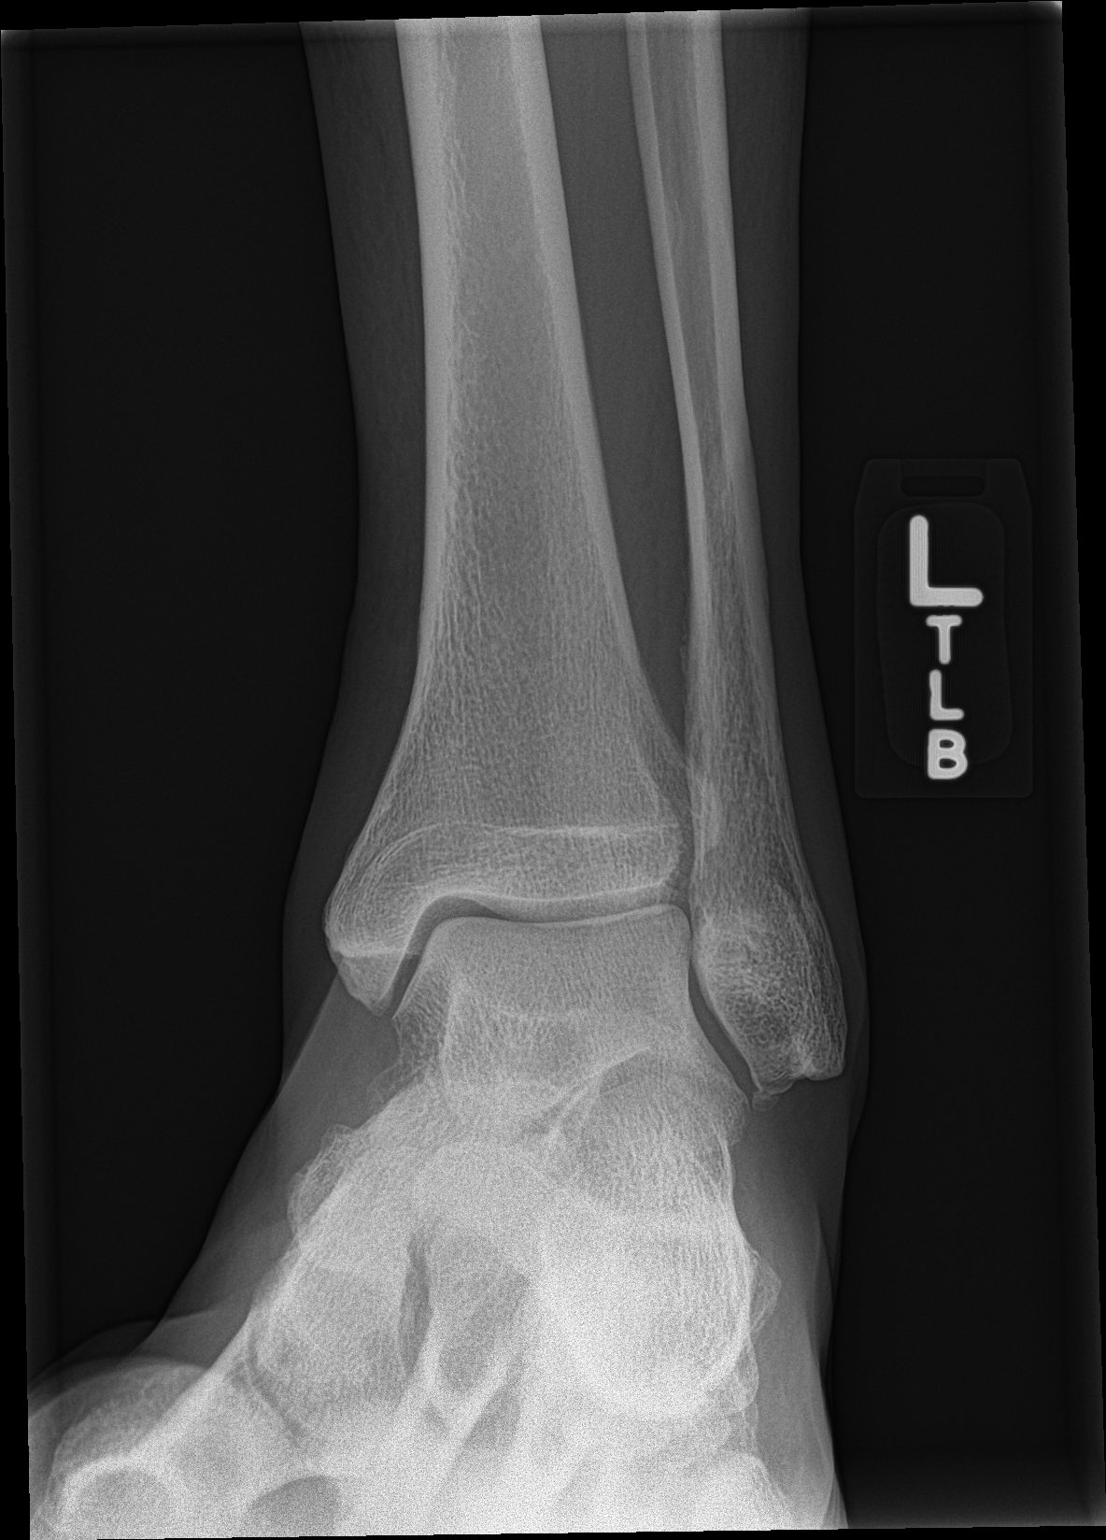

[ankle obl]
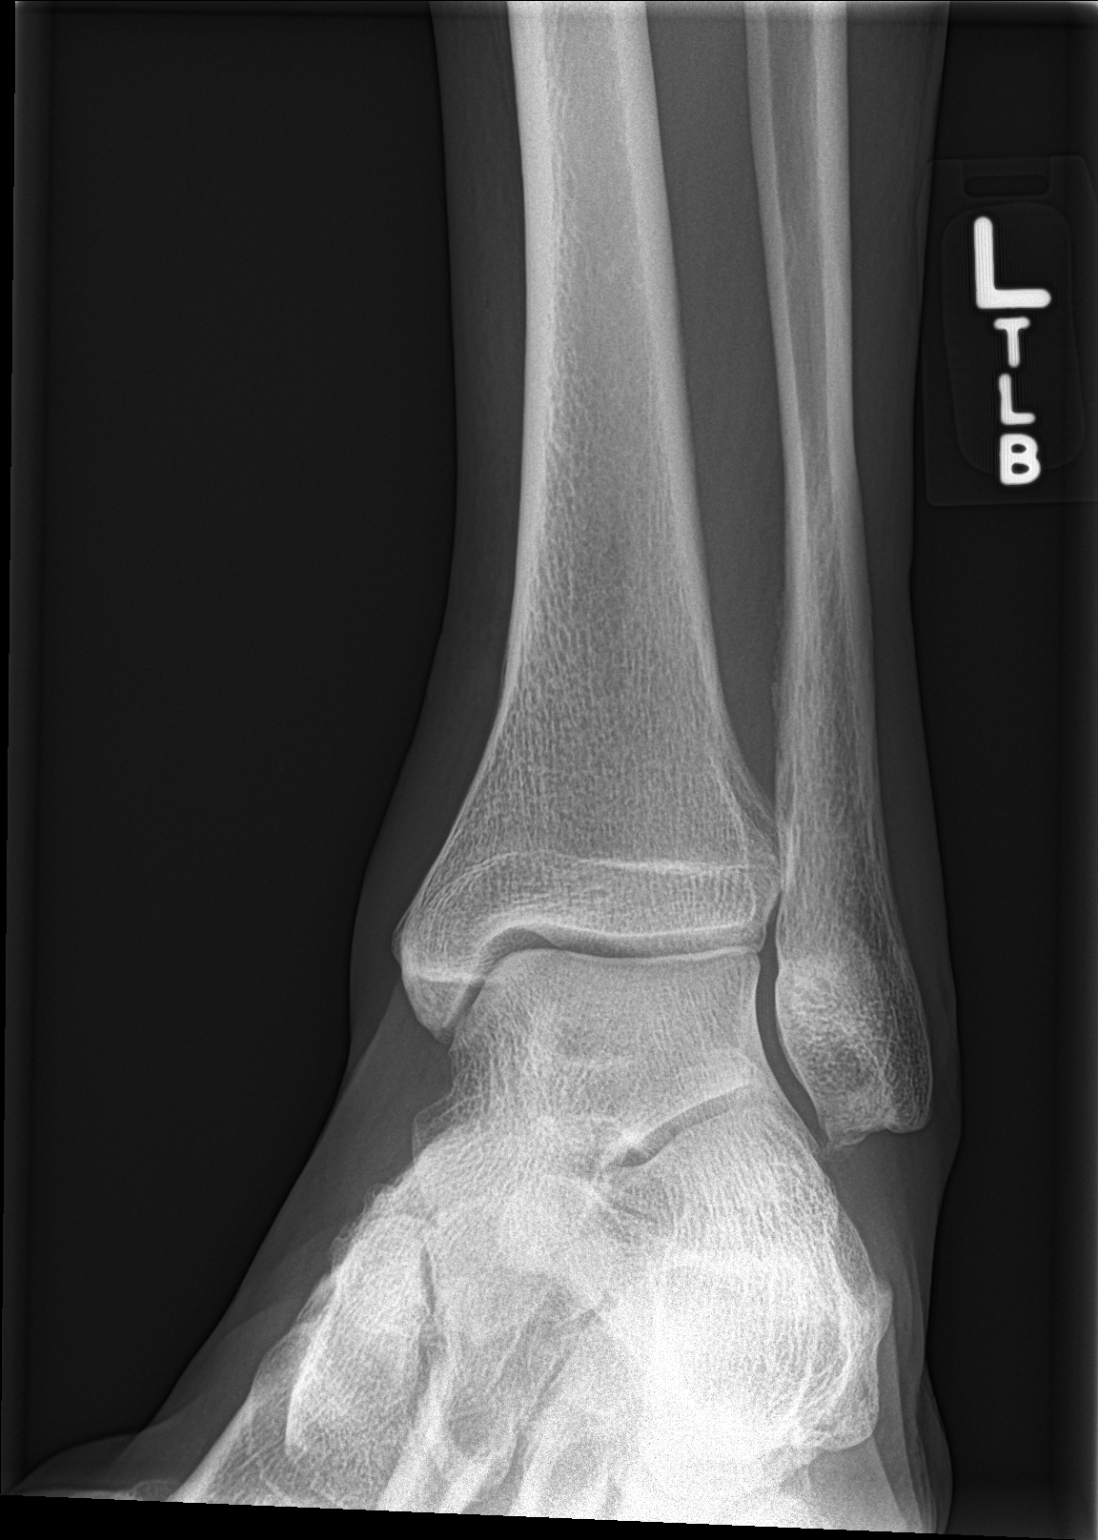

[ankle lat]
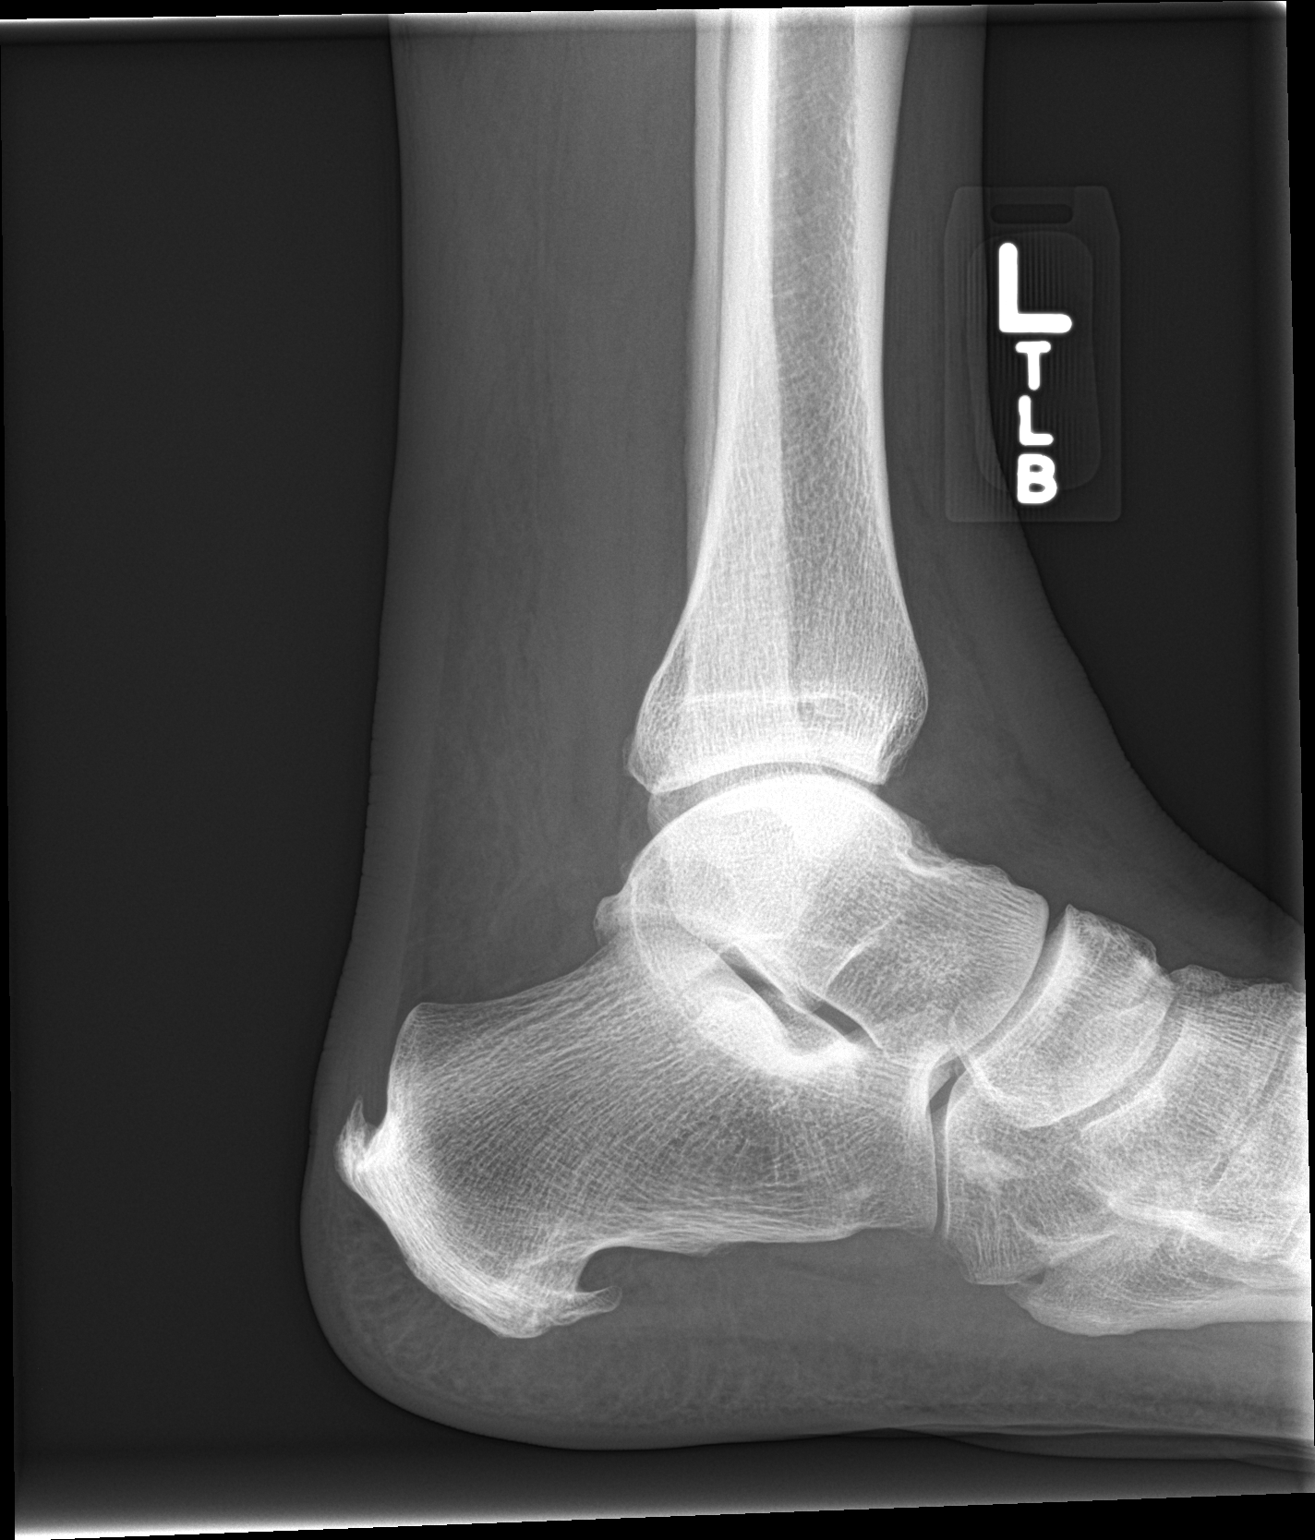

[3 of 3 positions shown; findings below may reference images not displayed]

FINDINGS: Osseous structures of the LEFT ankle appear intact and normally
aligned. Bone mineralization is normal. No degenerative change.
Visualized structures of the hindfoot and midfoot appear intact and
normally aligned. Adjacent soft tissues are unremarkable.
IMPRESSION: Negative LEFT ankle.

## 2020-06-16 IMAGING — CR DG FOOT COMPLETE 3+V*L*
3 series · 3 of 3 positions shown · non-contrast
Comparison: None.

CLINICAL DATA: Pt has onychomycosis of great toe nail. History of
open sore after pulling part of nail off in Saturday June, 2018, with
blisters. Now with soft tissue swelling.

EXAM:
LEFT FOOT - COMPLETE 3+ VIEW

[foot ap]
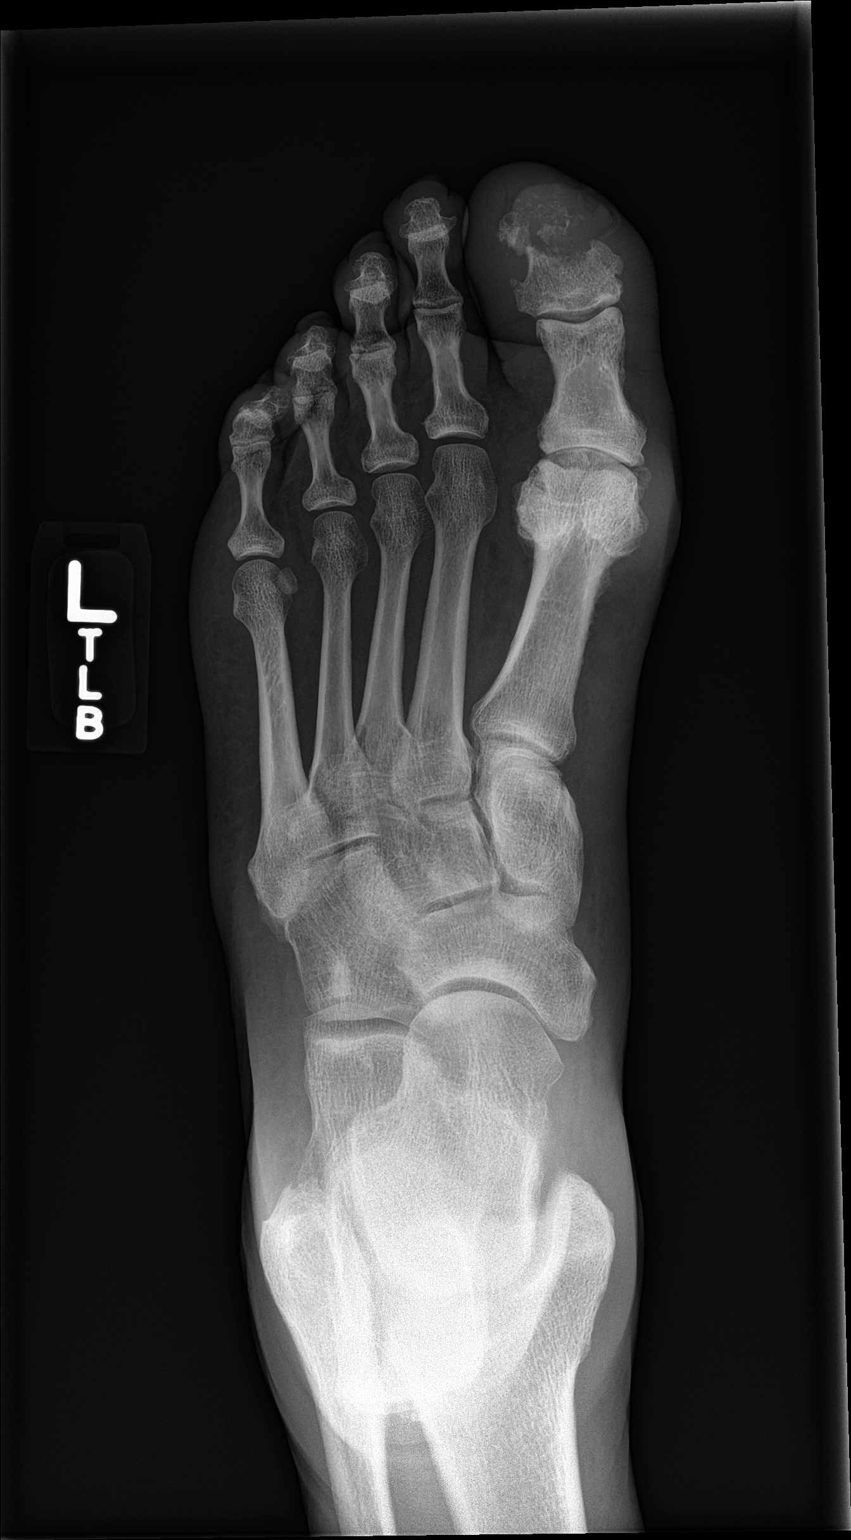

[foot obl]
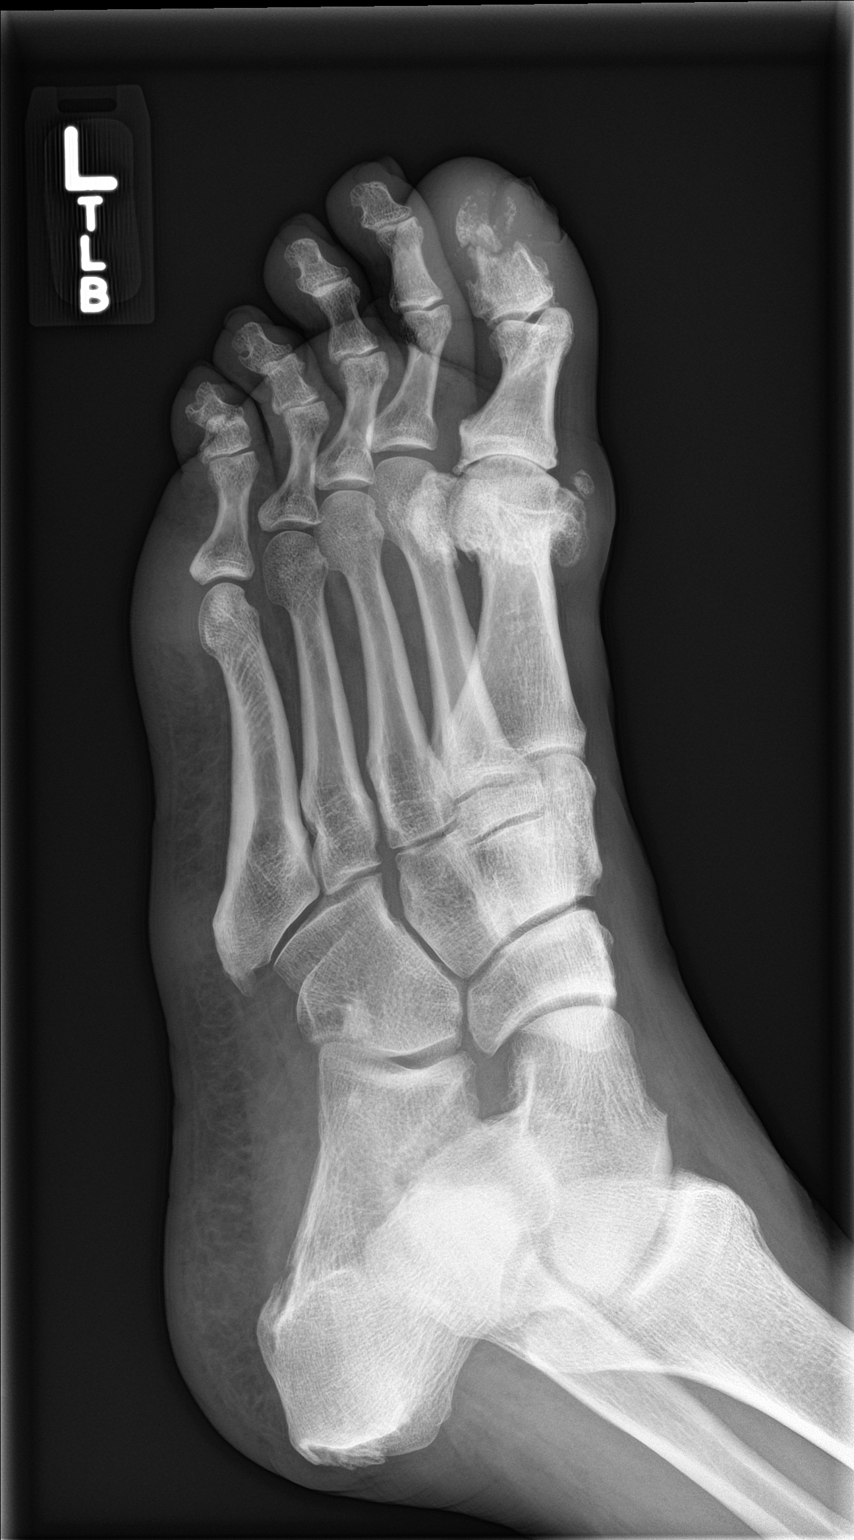

[foot lat]
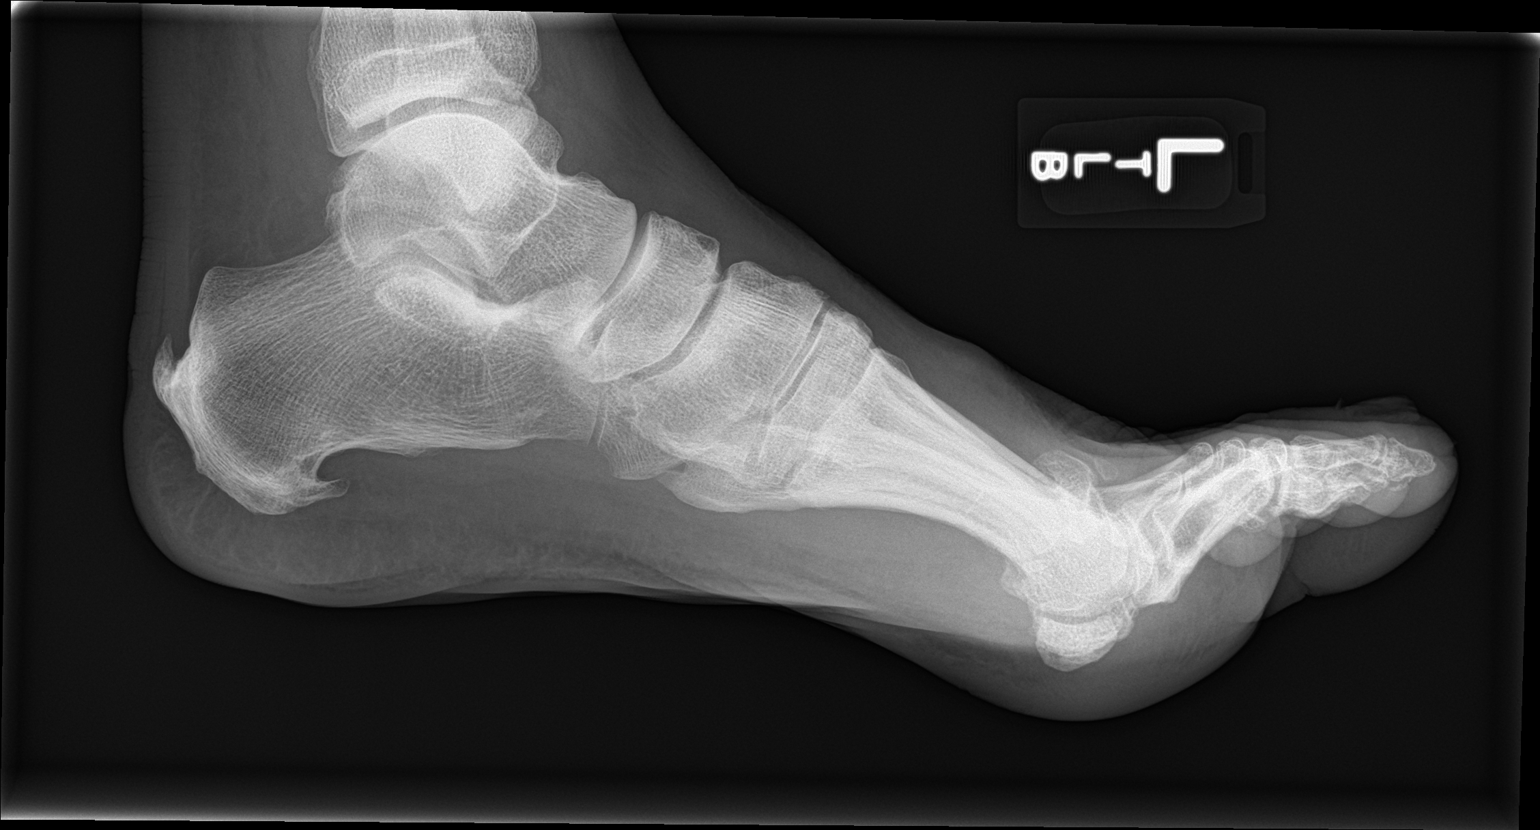

[3 of 3 positions shown; findings below may reference images not displayed]

FINDINGS: There are advanced destructive changes of the distal phalanx of the
great toe with associated fragmentation, presumed osteomyelitis.
Associated soft tissue swelling/edema. No soft tissue gas seen.

Remainder the osseous structures appear intact and normal in
mineralization. Mild degenerative osteoarthritis at the first MTP
joint.
IMPRESSION: Advanced destructive changes of the distal phalanx of the great toe
with associated fragmentation, presumed osteomyelitis. Associated
soft tissue swelling/edema.

## 2020-11-11 ENCOUNTER — Other Ambulatory Visit: Payer: Self-pay | Admitting: Cardiovascular Disease

## 2020-11-20 ENCOUNTER — Ambulatory Visit: Payer: 59 | Admitting: Family Medicine

## 2020-11-20 ENCOUNTER — Encounter: Payer: Self-pay | Admitting: Family Medicine

## 2020-11-20 ENCOUNTER — Other Ambulatory Visit: Payer: Self-pay

## 2020-11-20 VITALS — BP 128/76 | HR 68 | Temp 97.9°F | Resp 18 | Ht 72.0 in | Wt 170.1 lb

## 2020-11-20 DIAGNOSIS — I5022 Chronic systolic (congestive) heart failure: Secondary | ICD-10-CM

## 2020-11-20 DIAGNOSIS — M5431 Sciatica, right side: Secondary | ICD-10-CM

## 2020-11-20 DIAGNOSIS — E1142 Type 2 diabetes mellitus with diabetic polyneuropathy: Secondary | ICD-10-CM | POA: Diagnosis not present

## 2020-11-20 DIAGNOSIS — E11628 Type 2 diabetes mellitus with other skin complications: Secondary | ICD-10-CM | POA: Diagnosis not present

## 2020-11-20 DIAGNOSIS — E782 Mixed hyperlipidemia: Secondary | ICD-10-CM

## 2020-11-20 DIAGNOSIS — Z5181 Encounter for therapeutic drug level monitoring: Secondary | ICD-10-CM

## 2020-11-20 DIAGNOSIS — I10 Essential (primary) hypertension: Secondary | ICD-10-CM | POA: Diagnosis not present

## 2020-11-20 DIAGNOSIS — F17200 Nicotine dependence, unspecified, uncomplicated: Secondary | ICD-10-CM

## 2020-11-20 DIAGNOSIS — K219 Gastro-esophageal reflux disease without esophagitis: Secondary | ICD-10-CM

## 2020-11-20 DIAGNOSIS — Z89412 Acquired absence of left great toe: Secondary | ICD-10-CM

## 2020-11-20 DIAGNOSIS — Z955 Presence of coronary angioplasty implant and graft: Secondary | ICD-10-CM

## 2020-11-20 DIAGNOSIS — M5430 Sciatica, unspecified side: Secondary | ICD-10-CM

## 2020-11-20 DIAGNOSIS — Z760 Encounter for issue of repeat prescription: Secondary | ICD-10-CM

## 2020-11-20 DIAGNOSIS — I2510 Atherosclerotic heart disease of native coronary artery without angina pectoris: Secondary | ICD-10-CM

## 2020-11-20 MED ORDER — TRAMADOL HCL 50 MG PO TABS: 50.0000 mg | ORAL_TABLET | Freq: Three times a day (TID) | ORAL | 0 refills | Status: DC | PRN

## 2020-11-20 NOTE — Patient Instructions (Signed)
Health Maintenance  Topic Date Due  . Eye exam for diabetics  04/24/2020  . Hemoglobin A1C  11/20/2020  . COVID-19 Vaccine (1) 12/06/2020*  . Flu Shot  02/06/2021*  . Complete foot exam   05/20/2021  . Colon Cancer Screening  06/27/2024  . Tetanus Vaccine  01/03/2029  .  Hepatitis C: One time screening is recommended by Center for Disease Control  (CDC) for  adults born from [birth year] through 1965.   Completed  . HIV Screening  Completed  *Topic was postponed. The date shown is not the original due date.   Lung cancer screening low dose CT's are available with your smoking history - let me know if you're ever interested in that.   Lung Cancer Screening A lung cancer screening is a test that checks for lung cancer. Lung cancer screening is done to look for lung cancer in its very early stages when you are not likely to have any symptoms and before it spreads beyond the lung, making it harder to treat. Finding cancer early improves the chances of successful treatment. It may save your life. Who should have screening? You should be screened for lung cancer if all of these apply:  You currently smoke or you have quit smoking within the past 15 years.  You are 12-59 years old. Screening may be recommended up to age 43 depending on your overall health and other factors.  You are in good general health.  You have a smoking history of 1 pack of cigarettes a day for 20 years or 2 packs a day for 10 years. Screening may also be recommended if you are at high risk for the disease. You may be at high risk if:  You have a family history of lung cancer.  You have been exposed to asbestos or radon.  You have chronic obstructive pulmonary disease (COPD). How is screening done? The recommended screening test is a low-dose computed tomography (LDCT) scan. This scan takes detailed images of the lungs. This allows a health care provider to look for abnormal cells. If you are at risk for lung cancer, it is  recommended that you get screened once a year. Talk to your health care provider about the risks, benefits, and limitations of screening.   What are the benefits of screening? Screening can find lung cancer early, before symptoms start and before it has spread outside of the lungs. The chances of curing lung cancer are greater if the cancer is diagnosed early. What are the risks of screening?  The screening may show lung cancer when no cancer is present (false-positive result).  The screening may not find lung cancer when it is present.  The person gets exposed to radiation. How can I lower my risk of lung cancer? Make these lifestyle changes to lower your risk of developing lung cancer:  Do not use any products that contain nicotine or tobacco, such as cigarettes, e-cigarettes, and chewing tobacco. If you need help quitting, ask your health care provider.  Avoid secondhand smoke.  Avoid exposure to radiation.  Avoid exposure to radon gas. Have your home checked for radon regularly.  Avoid things that cause cancer (carcinogens).  Avoid living or working in places with high air pollution. Questions to ask your health care provider  Am I eligible for lung cancer screening?  Does my health insurance cover the cost of lung cancer screening?  What happens if the lung cancer screening shows something of concern?  How soon will I  have results from my lung cancer screening?  Is there anything that I need to do to prepare for my lung cancer screening?  What happens if I decide not to have lung cancer screening? Where to find more information Ask your health care provider about the risks and benefits of screening. More information and resources are available from these organizations:  [place] (ACS): www.cancer.org  American Lung Association: www.lung.org Contact a health care provider if:  You start to show symptoms of lung cancer, including: ? Coughing that will  not go away. ? Making whistling sounds when you breathe (wheezing). ? Chest pain. ? Coughing up blood. ? Shortness of breath. ? Weight loss that cannot be explained. ? Constant tiredness (fatigue). ? Hoarse voice. Summary  Lung cancer screening may find lung cancer before symptoms appear. Finding cancer early improves the chances of successful treatment. It may save your life.  The recommended screening test is a low-dose computed tomography (LDCT) scan that looks for abnormal cells in the lungs. If you are at risk for lung cancer, it is recommended that you get screened once a year.  You can make lifestyle changes to lower your risk of lung cancer.  Ask your health care provider about the risks and benefits of screening. This information is not intended to replace advice given to you by your health care provider. Make sure you discuss any questions you have with your health care provider. Document Revised: 03/18/2020 Document Reviewed: 10/24/2019 Elsevier Patient Education  2021 [employer].

## 2020-11-20 NOTE — Progress Notes (Signed)
Name: Brian Dean   MRN: [id number]    DOB: Jan 28, 1956   Date:11/20/2020       Progress Note  Chief Complaint  Patient presents with  . Diabetes  . Hyperlipidemia  . Hypertension     Subjective:   Brian Dean is a 65 y.o. male, presents to clinic for routine f/up    Hypertension:   Hx of MI (silent) hx of PCI - compliant with cardiology not on BB, taking asa and statin Last EKG: EKG personally reviewed by myself on todays visit showed sinus bradycardia LAFB, ST and T wave abnormality Currently managed on lisinopril 30 Pt reports good med compliance and denies any SE.   Blood pressure today is well controlled. BP Readings from Last 3 Encounters:  11/20/20 128/76  05/20/20 112/68  12/19/19 (!) 150/72   Pt denies CP, SOB, exertional sx, LE edema, palpitation, Ha's, visual disturbances, lightheadedness, hypotension, syncope.   Hyperlipidemia: Currently treated with atorvastin 20 mg, pt reports good med compliance Last Lipids: Lab Results  Component Value Date   CHOL 128 05/20/2020   HDL 39 (L) 05/20/2020   LDLCALC 69 05/20/2020   TRIG 112 05/20/2020   CHOLHDL 3.3 05/20/2020   - Denies: Chest pain, shortness of breath, myalgias, claudication  DM:   Pt managing DM with metformin, hx of being well controlled Reports good med compliance Pt has no SE from meds. Blood sugars - not checking  Denies: Polyuria, polydipsia, vision changes, neuropathy, hypoglycemia Recent pertinent labs: Lab Results  Component Value Date   HGBA1C 6.2 (H) 05/20/2020   HGBA1C 6.2 (H) 11/20/2019   HGBA1C 5.7 (H) 05/11/2019   Standard of care and health maintenance: Foot exam:  done DM eye exam:  Due - he is going to schedule - [facility] in [place] ACEI/ARB:  lisinopril Statin:  yes     Current smoker, prior heavy 1/4 ppd, smoked for 47 years      Current Outpatient Medications:  .  aspirin 81 MG tablet, Take 81 mg by mouth daily. Take 1 tablet (81 mg) by mouth once  daily, Disp: , Rfl:  .  atorvastatin (LIPITOR) 20 MG tablet, Take 1 tablet (20 mg total) by mouth at bedtime., Disp: 90 tablet, Rfl: 3 .  CINNAMON PO, Take 1,000 mg by mouth 2 (two) times daily. Reported on 05/06/2016, Disp: , Rfl:  .  finasteride (PROSCAR) 5 MG tablet, Take 1 tablet (5 mg total) by mouth daily., Disp: 90 tablet, Rfl: 3 .  gabapentin (NEURONTIN) 300 MG capsule, Take 1 capsule (300 mg total) by mouth daily., Disp: 90 capsule, Rfl: 3 .  lisinopril (ZESTRIL) 30 MG tablet, Take 1 tablet (30 mg total) by mouth daily., Disp: 90 tablet, Rfl: 3 .  metFORMIN (GLUCOPHAGE) 500 MG tablet, Take 1 tablet (500 mg total) by mouth 2 (two) times daily with a meal., Disp: 180 tablet, Rfl: 3 .  pantoprazole (PROTONIX) 40 MG tablet, TAKE 1 TABLET BY MOUTH EVERY DAY, Disp: 90 tablet, Rfl: 3 .  tadalafil (CIALIS) 5 MG tablet, Take 5 mg by mouth daily. Reported on 05/06/2016, Disp: , Rfl:  .  traMADol (ULTRAM) 50 MG tablet, Take 1 tablet (50 mg total) by mouth every 8 (eight) hours as needed for severe pain. #20 to last the year until 11/19/2020, Disp: 20 tablet, Rfl: 0 .  naproxen (NAPROSYN) 500 MG tablet, naproxen 500 mg tablet  TAKE 1 TABLET BY MOUTH TWICE A DAY (Patient not taking: Reported on 11/20/2020), Disp: ,  Rfl:  .  nitroGLYCERIN (NITROSTAT) 0.4 MG SL tablet, Place 1 tablet (0.4 mg total) under the tongue every 5 (five) minutes as needed. (Patient not taking: No sig reported), Disp: 25 tablet, Rfl: 3  Patient Active Problem List   Diagnosis Date Noted  . Onychomycosis of left great toe 01/03/2019  . Sciatica 10/27/2017  . Rectal bleeding 05/07/2016  . Internal hemorrhoids 05/07/2016  . Hyperlipidemia   . GERD (gastroesophageal reflux disease)   . Hypertensive heart disease   . Unspecified systolic (congestive) heart failure ([place])   . ED (erectile dysfunction)   . Diabetic neuropathy ([place])   . S/P coronary artery stent placement 09/25/2013  . CAD (coronary artery disease) 07/28/2013  .  Diabetes mellitus type II, controlled ([place]) 07/28/2013  . Abnormal EKG 07/24/2013  . Smoker 07/24/2013  . Gallstone 07/17/2013  . Inguinal hernia 07/17/2013    Past Surgical History:  Procedure Laterality Date  . AMPUTATION Left 01/04/2019   Procedure: AMPUTATION RAY LEFT GREAT TOE;  Surgeon: Sharlotte Alamo, DPM;  Location: [facility];  Service: Podiatry;  Laterality: Left;  . CARDIAC CATHETERIZATION  11/14   [facility] x1  . [facility]ONOSCOPY  2004   [facility]  . COLONOSCOPY WITH PROPOFOL N/A 06/22/2016   Procedure: COLONOSCOPY WITH PROPOFOL;  Surgeon: Robert Bellow, MD;  Location: [facility];  Service: Endoscopy;  Laterality: N/A;  . COLONOSCOPY WITH PROPOFOL N/A 06/28/2019   Procedure: COLONOSCOPY WITH PROPOFOL;  Surgeon: Lin Landsman, MD;  Location: [facility];  Service: Endoscopy;  Laterality: N/A;  Diabetic - oral meds  . ELBOW ARTHROSCOPY  2001   lt  . ESOPHAGOGASTRODUODENOSCOPY (EGD) WITH PROPOFOL N/A 06/28/2019   Procedure: ESOPHAGOGASTRODUODENOSCOPY (EGD) WITH PROPOFOL;  Surgeon: Lin Landsman, MD;  Location: [facility];  Service: Endoscopy;  Laterality: N/A;  . GUM SURGERY    . OLECRANON BURSECTOMY  11/12/2011   Procedure: OLECRANON BURSA;  Surgeon: Linna Hoff;  Location: [place];  Service: Orthopedics;  Laterality: Right;  olecracnon bursectomy with delayed closure  . POLYPECTOMY  06/28/2019   Procedure: POLYPECTOMY;  Surgeon: Lin Landsman, MD;  Location: [place];  Service: Endoscopy;;  . WISDOM TOOTH EXTRACTION      Family History  Problem Relation Age of Onset  . Hypertension Father   . Diabetes Father   . Heart disease Father   . Diabetes Mother   . Heart disease Mother   . Hypertension Mother   . Cancer Mother        bladder  . Diabetes Sister   . Stroke Sister        mini stroke  . Diabetes Brother   . Diabetes Sister   . Diabetes Sister   . Diabetes Sister   . Diabetes Sister     Social History    Tobacco Use  . Smoking status: Current Every Day Smoker    Packs/day: 0.50    Years: 47.00    Pack years: 23.50    Types: Cigarettes    Last attempt to quit: 11/09/2012    Years since quitting: 8.0  . Smokeless tobacco: Never Used  . Tobacco comment: down to 5 cigs/day  Vaping Use  . Vaping Use: Some days  Substance Use Topics  . Alcohol use: Yes    Alcohol/week: 0.0 standard drinks    Comment: occasional (3-4x/yr)  . Drug use: Yes     No Known Allergies  Health Maintenance  Topic Date Due  . OPHTHALMOLOGY EXAM  04/24/2020  . HEMOGLOBIN A1C  11/20/2020  . COVID-19 Vaccine (1) 12/06/2020 (Originally 09/02/1968)  . INFLUENZA VACCINE  02/06/2021 (Originally 06/09/2020)  . FOOT EXAM  05/20/2021  . COLONOSCOPY (Pts 45-36yrs Insurance coverage will need to be confirmed)  06/27/2024  . TETANUS/TDAP  01/03/2029  . Hepatitis C Screening  Completed  . HIV Screening  Completed    Chart Review Today: I personally reviewed active problem list, medication list, allergies, family history, social history, health maintenance, notes from last encounter, lab results, imaging with the patient/caregiver today.   Review of Systems  10 Systems reviewed and are negative for acute change except as noted in the HPI.  Objective:   Vitals:   11/20/20 1025  BP: 128/76  Pulse: 68  Resp: 18  Temp: 97.9 F (36.6 C)  TempSrc: Oral  SpO2: 95%  Weight: 170 lb 1.6 oz (77.2 kg)  Height: 6' (1.829 m)    Body mass index is 23.07 kg/m.  Physical Exam Vitals and nursing note reviewed.  Constitutional:      General: He is not in acute distress.    Appearance: Normal appearance. He is well-developed and normal weight. He is not ill-appearing, toxic-appearing or diaphoretic.     Interventions: Face mask in place.  HENT:     Head: Normocephalic and atraumatic.     Jaw: No trismus.     Right Ear: External ear normal.     Left Ear: External ear normal.  Eyes:     General: Lids are normal. No  scleral icterus.       Right eye: No discharge.        Left eye: No discharge.     Conjunctiva/sclera: Conjunctivae normal.  Neck:     Trachea: Trachea and phonation normal. No tracheal deviation.  Cardiovascular:     Rate and Rhythm: Normal rate and regular rhythm.     Pulses: Normal pulses.          Radial pulses are 2+ on the right side and 2+ on the left side.       Posterior tibial pulses are 2+ on the right side and 2+ on the left side.     Heart sounds: Normal heart sounds. No murmur heard. No friction rub. No gallop.   Pulmonary:     Effort: Pulmonary effort is normal. No respiratory distress.     Breath sounds: Normal breath sounds. No stridor. No wheezing, rhonchi or rales.  Abdominal:     General: Bowel sounds are normal. There is no distension.     Palpations: Abdomen is soft.  Musculoskeletal:     Right lower leg: No edema.     Left lower leg: No edema.  Skin:    General: Skin is warm and dry.     Coloration: Skin is not jaundiced.     Findings: No rash.     Nails: There is no clubbing.  Neurological:     Mental Status: He is alert. Mental status is at baseline.     Cranial Nerves: No dysarthria or facial asymmetry.     Motor: No tremor or abnormal muscle tone.     Gait: Gait normal.  Psychiatric:        Mood and Affect: Mood normal.        Speech: Speech normal.        Behavior: Behavior normal. Behavior is cooperative.     Diabetic Foot Exam - Simple   No data filed  Assessment & Plan:     ICD-10-CM   1. Controlled type 2 diabetes mellitus with other skin complication, without long-term current use of insulin (HCC)  123456 COMPLETE METABOLIC PANEL WITH GFR    Hemoglobin A1c  2. Diabetic polyneuropathy associated with type 2 diabetes mellitus (HCC)  XX123456 COMPLETE METABOLIC PANEL WITH GFR    Hemoglobin A1c  3. Mixed hyperlipidemia  99991111 COMPLETE METABOLIC PANEL WITH GFR   compliant with statin  4. Hypertension, unspecified type  I10  COMPLETE METABOLIC PANEL WITH GFR   stable, well controlled, BP at goal today  5. Chronic systolic congestive heart failure (HCC)  I50.22    pt appears euvolemic, per cardiology  6. Gastroesophageal reflux disease without esophagitis  K21.9    well controlled, stable  7. Coronary artery disease involving native coronary artery of native heart without angina pectoris  I25.10    per cardiology  8. Smoker  F17.200   9. Sciatica, unspecified laterality  M54.30   10. S/P coronary artery stent placement  Z95.5    per specialists, no new sx - at baseline  11. Medication refill  Z76.0   12. Left great toe amputee (HCC)  Z89.412 traMADol (ULTRAM) 50 MG tablet  13. Sciatica of right side  M54.31 traMADol (ULTRAM) 50 MG tablet  14. Encounter for medication monitoring  [id number] COMPLETE METABOLIC PANEL WITH GFR    CBC with Differential/Platelet    Hemoglobin A1c      Delsa Grana, PA-C 11/20/20 10:48 AM

## 2020-11-21 LAB — CBC WITH DIFFERENTIAL/PLATELET
Absolute Monocytes: 531 cells/uL (ref 200–950)
Basophils Absolute: 41 cells/uL (ref 0–200)
Basophils Relative: 0.6 %
Eosinophils Absolute: 104 cells/uL (ref 15–500)
Eosinophils Relative: 1.5 %
HCT: 41.5 % (ref 38.5–50.0)
Hemoglobin: 14 g/dL (ref 13.2–17.1)
Lymphs Abs: 1642 cells/uL (ref 850–3900)
MCH: 31.5 pg (ref 27.0–33.0)
MCHC: 33.7 g/dL (ref 32.0–36.0)
MCV: 93.3 fL (ref 80.0–100.0)
MPV: 9.8 fL (ref 7.5–12.5)
Monocytes Relative: 7.7 %
Neutro Abs: 4582 cells/uL (ref 1500–7800)
Neutrophils Relative %: 66.4 %
Platelets: 240 10*3/uL (ref 140–400)
RBC: 4.45 10*6/uL (ref 4.20–5.80)
RDW: 11.8 % (ref 11.0–15.0)
Total Lymphocyte: 23.8 %
WBC: 6.9 10*3/uL (ref 3.8–10.8)

## 2020-11-21 LAB — COMPLETE METABOLIC PANEL WITH GFR
AG Ratio: 1.8 (calc) (ref 1.0–2.5)
ALT: 31 U/L (ref 9–46)
AST: 28 U/L (ref 10–35)
Albumin: 4.6 g/dL (ref 3.6–5.1)
Alkaline phosphatase (APISO): 56 U/L (ref 35–144)
BUN: 14 mg/dL (ref 7–25)
CO2: 27 mmol/L (ref 20–32)
Calcium: 10.1 mg/dL (ref 8.6–10.3)
Chloride: 101 mmol/L (ref 98–110)
Creat: 1.05 mg/dL (ref 0.70–1.25)
GFR, Est African American: 87 mL/min/{1.73_m2} (ref >=60)
GFR, Est Non African American: 75 mL/min/{1.73_m2} (ref >=60)
Globulin: 2.6 g/dL (calc) (ref 1.9–3.7)
Glucose, Bld: 144 mg/dL — ABNORMAL HIGH (ref 65–99)
Potassium: 6 mmol/L — ABNORMAL HIGH (ref 3.5–5.3)
Sodium: 135 mmol/L (ref 135–146)
Total Bilirubin: 0.8 mg/dL (ref 0.2–1.2)
Total Protein: 7.2 g/dL (ref 6.1–8.1)

## 2020-11-21 LAB — HEMOGLOBIN A1C
Hgb A1c MFr Bld: 6 % of total Hgb — ABNORMAL HIGH (ref <5.7)
Mean Plasma Glucose: 126 mg/dL
eAG (mmol/L): 7 mmol/L

## 2020-11-22 ENCOUNTER — Other Ambulatory Visit: Payer: Self-pay | Admitting: Emergency Medicine

## 2020-11-22 DIAGNOSIS — E875 Hyperkalemia: Secondary | ICD-10-CM

## 2020-12-19 NOTE — Progress Notes (Unsigned)
Continue me via and does not rebound with surgery cardiology Office Note  Date:  12/20/2020   ID:  Brian Dean, DOB [birth year], Brian Dean  PCP:  Delsa Grana, PA-C   Chief Complaint  Patient presents with  . Annual Exam    Pt states he is doing well    HPI:  Brian Dean is a 65 y.o. 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 07/26/2013 Previous Stress Myoview showed what appears to be a proximally occluded RCA with peri-infarct ischemia.  Left anterior descending and left circumflex territory appear intact with no perfusion abnormality. Ejection fraction 47% with wall motion abnormality in the inferior wall. 07/08/2013 - 07/09/2013 Hospitalized for acute pyelonephritis, acute on chronic low back pain, hyponatremia, diabetes, hypertension 07/07/2013 CT scan shows large calcified stone in the gallbladder neck, enlarged prostate He denied having any symptoms from his gallbladder stones     PMH:   has a past  medical history of Anemia, Arthritis, CAD (coronary artery disease) (Nov 2014), Diabetes mellitus without complication ([place]), Diabetic neuropathy ([place]), Dyslipidemia, ED (erectile dysfunction), GERD (gastroesophageal reflux disease), History of acute pyelonephritis (Aug. 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  PSH:    Past Surgical History:  Procedure Laterality Date  . AMPUTATION Left 01/04/2019   Procedure: AMPUTATION RAY LEFT GREAT TOE;  Surgeon: Sharlotte Alamo, DPM;  Location: [facility];  Service: Podiatry;  Laterality: Left;  . CARDIAC CATHETERIZATION  11/14   [facility] x1  . [facility]ONOSCOPY  2004   [facility]  . COLONOSCOPY WITH PROPOFOL N/A 06/22/2016   Procedure: COLONOSCOPY WITH PROPOFOL;  Surgeon: Robert Bellow, MD;  Location: [facility];  Service: Endoscopy;  Laterality: N/A;  . COLONOSCOPY WITH PROPOFOL N/A 06/28/2019   Procedure: COLONOSCOPY WITH PROPOFOL;  Surgeon: Lin Landsman, MD;  Location: [facility];  Service: Endoscopy;  Laterality: N/A;  Diabetic - oral meds  . ELBOW ARTHROSCOPY  2001   lt  . ESOPHAGOGASTRODUODENOSCOPY (EGD) WITH PROPOFOL N/A 06/28/2019   Procedure: ESOPHAGOGASTRODUODENOSCOPY (EGD) WITH PROPOFOL;  Surgeon: Lin Landsman, MD;  Location: [facility];  Service: Endoscopy;  Laterality: N/A;  . GUM SURGERY    . OLECRANON BURSECTOMY  11/12/2011   Procedure: OLECRANON BURSA;  Surgeon: Linna Hoff;  Location: [facility];  Service: Orthopedics;  Laterality: Right;  olecracnon bursectomy with delayed closure  . POLYPECTOMY  06/28/2019  Procedure: POLYPECTOMY;  Surgeon: Lin Landsman, MD;  Location: [facility];  Service: Endoscopy;;  . WISDOM TOOTH EXTRACTION      Current  Outpatient Medications  Medication Sig Dispense Refill  . aspirin 81 MG tablet Take 81 mg by mouth daily. Take 1 tablet (81 mg) by mouth once daily    . atorvastatin (LIPITOR) 20 MG tablet Take 1 tablet (20 mg total) by mouth at bedtime. 90 tablet 3  . CINNAMON PO Take 1,000 mg by mouth 2 (two) times daily. Reported on 05/06/2016    . finasteride (PROSCAR) 5 MG tablet Take 1 tablet (5 mg total) by mouth daily. 90 tablet 3  . gabapentin (NEURONTIN) 300 MG capsule Take 1 capsule (300 mg total) by mouth daily. 90 capsule 3  . lisinopril (ZESTRIL) 30 MG tablet Take 1 tablet (30 mg total) by mouth daily. 90 tablet 3  . metFORMIN (GLUCOPHAGE) 500 MG tablet Take 1 tablet (500 mg total) by mouth 2 (two) times daily with a meal. 180 tablet 3  . naproxen (NAPROSYN) 500 MG tablet     . nitroGLYCERIN (NITROSTAT) 0.4 MG SL tablet Place 1 tablet (0.4 mg total) under the tongue every 5 (five) minutes as needed. 25 tablet 3  . pantoprazole (PROTONIX) 40 MG tablet TAKE 1 TABLET BY MOUTH EVERY DAY 90 tablet 3  . tadalafil (CIALIS) 5 MG tablet Take 5 mg by mouth daily. Reported on 05/06/2016    . traMADol (ULTRAM) 50 MG tablet Take 1 tablet (50 mg total) by mouth every 8 (eight) hours as needed for severe pain. #20 to last the year until 11/19/2021 20 tablet 0   No current facility-administered medications for this visit.    Allergies:   Patient has no known allergies.   Social History:  The patient  reports that he has been smoking cigarettes. He has a 23.50 pack-year smoking history. He has never used smokeless tobacco. He reports current alcohol use. He reports current drug use.   Family History:   family history includes Cancer in his mother; Diabetes in his brother, father, mother, sister, sister, sister, sister, and sister; Heart disease in his father and mother; Hypertension in his father and mother; Stroke in his sister.   Review of Systems: Review of Systems  Constitutional: Negative.   Respiratory:  Negative.   Cardiovascular: Negative.   Gastrointestinal: Negative.   Musculoskeletal: Negative.   Neurological: Negative.   Psychiatric/Behavioral: Negative.   All other systems reviewed and are negative.   PHYSICAL EXAM: VS:  BP 140/72   Pulse 60   Ht 6' (1.829 m)   Wt 170 lb (77.1 kg)   BMI 23.06 kg/m  , BMI Body mass index is 23.06 kg/m.  Constitutional:  oriented to person, place, and time. No distress.  HENT:  Head: Grossly normal Eyes:  no discharge. No scleral icterus.  Neck: No JVD, no carotid bruits  Cardiovascular: Regular rate and rhythm, no murmurs appreciated Pulmonary/Chest: Clear to auscultation bilaterally, no wheezes or rails Abdominal: Soft.  no distension.  no tenderness.  Musculoskeletal: Normal range of motion Neurological:  normal muscle tone. Coordination normal. No atrophy Skin: Skin warm and dry Psychiatric: normal affect, pleasant   Recent Labs: 11/20/2020: ALT 31; BUN 14; Creat 1.05; Hemoglobin 14.0; Platelets 240; Potassium 6.0; Sodium 135    Lipid Panel Lab Results  Component Value Date   CHOL 128 05/20/2020   HDL 39 (L) 05/20/2020   LDLCALC 69 05/20/2020   TRIG 112 05/20/2020  WEIGHT LOSS: Wt Readings from Last 3 Encounters:  12/20/20 170 lb (77.1 kg)  11/20/20 170 lb 1.6 oz (77.2 kg)  05/20/20 169 lb 6.4 oz (76.8 kg)      ASSESSMENT AND PLAN:  Hypertensive heart disease without heart failure Blood pressure is well controlled on today's visit. No changes made to the medications.  Coronary artery disease involving native coronary artery of native heart without angina pectoris Currently with no symptoms of angina. No further workup at this time. Continue current medication regimen. Still smoking  Mixed hyperlipidemia Cholesterol is at goal on the current lipid regimen. No changes to the medications were made.  Type 2 diabetes mellitus without complication, without long-term current use of insulin (HCC) Plan: A1C 6.0,  stable  Smoker Still smoking Does not want chantix  S/P coronary artery stent placement No further testing  Total encounter time more than 25 minutes Greater than 50% was spent in counseling and coordination of care with the patient      Signed, Esmond Plants, M.D., Ph.D. 12/20/2020  [place], [place]

## 2020-12-20 ENCOUNTER — Encounter: Payer: Self-pay | Admitting: Cardiovascular Disease

## 2020-12-20 ENCOUNTER — Other Ambulatory Visit: Payer: Self-pay

## 2020-12-20 ENCOUNTER — Ambulatory Visit: Payer: 59 | Admitting: Cardiovascular Disease

## 2020-12-20 VITALS — BP 140/72 | HR 60 | Ht 72.0 in | Wt 170.0 lb

## 2020-12-20 DIAGNOSIS — I119 Hypertensive heart disease without heart failure: Secondary | ICD-10-CM

## 2020-12-20 DIAGNOSIS — F17200 Nicotine dependence, unspecified, uncomplicated: Secondary | ICD-10-CM

## 2020-12-20 DIAGNOSIS — E119 Type 2 diabetes mellitus without complications: Secondary | ICD-10-CM

## 2020-12-20 DIAGNOSIS — I25118 Atherosclerotic heart disease of native coronary artery with other forms of angina pectoris: Secondary | ICD-10-CM | POA: Diagnosis not present

## 2020-12-20 DIAGNOSIS — E782 Mixed hyperlipidemia: Secondary | ICD-10-CM

## 2020-12-20 LAB — BASIC METABOLIC PANEL WITH GFR
BUN: 16 mg/dL (ref 7–25)
CO2: 29 mmol/L (ref 20–32)
Calcium: 9.8 mg/dL (ref 8.6–10.3)
Chloride: 104 mmol/L (ref 98–110)
Creat: 0.97 mg/dL (ref 0.70–1.25)
GFR, Est African American: 95 mL/min/{1.73_m2} (ref >=60)
GFR, Est Non African American: 82 mL/min/{1.73_m2} (ref >=60)
Glucose, Bld: 97 mg/dL (ref 65–99)
Potassium: 5.1 mmol/L (ref 3.5–5.3)
Sodium: 138 mmol/L (ref 135–146)

## 2020-12-20 NOTE — Patient Instructions (Signed)
Medication Instructions:  No changes  If you need a refill on your cardiac medications before your next appointment, please call your pharmacy.    Lab work: No new labs needed   If you have labs (blood work) drawn today and your tests are completely normal, you will receive your results only by: . MyChart Message (if you have MyChart) OR . A paper copy in the mail If you have any lab test that is abnormal or we need to change your treatment, we will call you to review the results.   Testing/Procedures: No new testing needed   Follow-Up: At [facility], you and your health needs are our priority.  As part of our continuing mission to provide you with exceptional heart care, we have created designated Provider Care Teams.  These Care Teams include your primary Cardiologist (physician) and Advanced Practice Providers (APPs -  Physician Assistants and Nurse Practitioners) who all work together to provide you with the care you need, when you need it.  . You will need a follow up appointment in 12 months  . Providers on your designated Care Team:   . Christopher Berge, NP . Ryan Dunn, PA-C . Jacquelyn Visser, PA-C  Any Other Special Instructions Will Be Listed Below (If Applicable).  COVID-19 Vaccine Information can be found at: [email or web address] For questions related to vaccine distribution or appointments, please email [email or web address] or call [phone].     

## 2021-02-06 ENCOUNTER — Encounter: Payer: Self-pay | Admitting: Family Medicine

## 2021-03-04 LAB — HM DIABETES EYE EXAM

## 2021-05-14 ENCOUNTER — Telehealth: Payer: Self-pay | Admitting: Cardiovascular Disease

## 2021-05-14 MED ORDER — NITROGLYCERIN 0.4 MG SL SUBL: 0.4000 mg | SUBLINGUAL_TABLET | SUBLINGUAL | 0 refills | Status: DC | PRN

## 2021-05-14 NOTE — Telephone Encounter (Signed)
*  STAT* If patient is at the pharmacy, call can be transferred to refill team.   1. Which medications need to be refilled? (please list name of each medication and dose if known) nitroglycerin   2. Which pharmacy/location (including street and city if local pharmacy) is medication to be sent to? [facility] in [place]  3. Do they need a 30 day or 90 day supply? 1 bottle

## 2021-05-14 NOTE — Telephone Encounter (Signed)
Requested Prescriptions   Signed Prescriptions Disp Refills   nitroGLYCERIN (NITROSTAT) 0.4 MG SL tablet 25 tablet 0    Sig: Place 1 tablet (0.4 mg total) under the tongue every 5 (five) minutes as needed.    Authorizing Provider: Minna Merritts    Ordering User: Britt Bottom

## 2021-05-20 ENCOUNTER — Other Ambulatory Visit: Payer: Self-pay

## 2021-05-20 ENCOUNTER — Encounter: Payer: Self-pay | Admitting: Family Medicine

## 2021-05-20 ENCOUNTER — Ambulatory Visit: Payer: 59 | Admitting: Family Medicine

## 2021-05-20 VITALS — BP 142/60 | HR 61 | Temp 98.0°F | Resp 16 | Ht 72.0 in | Wt 164.0 lb

## 2021-05-20 DIAGNOSIS — K219 Gastro-esophageal reflux disease without esophagitis: Secondary | ICD-10-CM

## 2021-05-20 DIAGNOSIS — E782 Mixed hyperlipidemia: Secondary | ICD-10-CM

## 2021-05-20 DIAGNOSIS — I2510 Atherosclerotic heart disease of native coronary artery without angina pectoris: Secondary | ICD-10-CM

## 2021-05-20 DIAGNOSIS — E1142 Type 2 diabetes mellitus with diabetic polyneuropathy: Secondary | ICD-10-CM | POA: Diagnosis not present

## 2021-05-20 DIAGNOSIS — I10 Essential (primary) hypertension: Secondary | ICD-10-CM | POA: Diagnosis not present

## 2021-05-20 DIAGNOSIS — E11628 Type 2 diabetes mellitus with other skin complications: Secondary | ICD-10-CM

## 2021-05-20 DIAGNOSIS — N529 Male erectile dysfunction, unspecified: Secondary | ICD-10-CM

## 2021-05-20 DIAGNOSIS — J449 Chronic obstructive pulmonary disease, unspecified: Secondary | ICD-10-CM

## 2021-05-20 DIAGNOSIS — F17200 Nicotine dependence, unspecified, uncomplicated: Secondary | ICD-10-CM

## 2021-05-20 DIAGNOSIS — I5022 Chronic systolic (congestive) heart failure: Secondary | ICD-10-CM

## 2021-05-20 MED ORDER — GABAPENTIN 300 MG PO CAPS: 300.0000 mg | ORAL_CAPSULE | Freq: Every day | ORAL | 3 refills | Status: DC

## 2021-05-20 MED ORDER — METFORMIN HCL 500 MG PO TABS: 500.0000 mg | ORAL_TABLET | Freq: Two times a day (BID) | ORAL | 3 refills | Status: DC

## 2021-05-20 MED ORDER — ATORVASTATIN CALCIUM 20 MG PO TABS: 20.0000 mg | ORAL_TABLET | Freq: Every day | ORAL | 3 refills | Status: DC

## 2021-05-20 MED ORDER — LISINOPRIL 30 MG PO TABS: 30.0000 mg | ORAL_TABLET | Freq: Every day | ORAL | 3 refills | Status: DC

## 2021-05-20 NOTE — Progress Notes (Signed)
Name: Brian Dean   MRN: [id number]    DOB: 09/21/1956   Date:05/20/2021       Progress Note  Chief Complaint  Patient presents with   Diabetes   Hyperlipidemia   Hypertension     Subjective:   Brian Dean is a 65 y.o. male, presents to clinic for routine f/up  Hypertension:  Hx of MI (silent) hx of PCI - compliant with cardiology not on BB, taking asa and statin, ACEI Last cardiology visit 12/2020 - reviewed today, Dr. Rockey Situ Currently managed on lisinopril 30 Pt reports good med compliance and denies any SE.   BP higher today, slightly uncontrolled BP Readings from Last 3 Encounters:  05/20/21 (!) 142/60  12/20/20 140/72  11/20/20 128/76   Pt denies CP, SOB, exertional sx, LE edema, palpitation, Ha's, visual disturbances, lightheadedness, hypotension, syncope.  Last Cardiology note/documentation and history reviewed: Coronary Artery Disease, Coronary Artery Stent Placement, proximal left circumflex, 09/25/2013 Long smoking history, 0.5 ppd, 20 pack years No longer vapes Gallstone Prostate issues Positive Stress test Who presents for follow up of of Coronary Artery Disease   Help neighbor with stocking wood   No chest pain, angina No SOB   Retired, Smoking 5-6 a day   MRI neck tomorrow , right had nerve palsy Pain better   Lab work reviewed with him on today's visit Total chol 124/ LDL 64 A1C 6.2   EKG personally reviewed by myself on todays visit showed nsr rate 60, ST and T wave abnormality   OTHER PAST MEDICAL HISTORY REVIEWED BY ME FOR TODAY'S VISIT: 2019 12/13/2017 EKG showed sinus bradycardia rate 49 bpm T wave abnormality lead III aVF Similar to previous EKG 2017   2014 09/25/2013 Stent placed to his proximal left circumflex 09/15/2013 Cardiac catheterization showed severe proximal left circumflex disease, 50% lesions in his LAD, 40% RCA disease. 07/26/2013 Previous Stress Myoview showed what appears to be a proximally occluded RCA with  peri-infarct ischemia. Left anterior descending and left circumflex territory appear intact with no perfusion abnormality. Ejection fraction 47% with wall motion abnormality in the inferior wall. 07/08/2013 - 07/09/2013 Hospitalized for acute pyelonephritis, acute on chronic low back pain, hyponatremia, diabetes, hypertension 07/07/2013 CT scan shows large calcified stone in the gallbladder neck, enlarged prostate He denied having any symptoms from his gallbladder stones  DM:   Pt managing DM with metformin - he sees Brian Dean for foot care due to foot ulcer, neuropathy  Reports good med compliance Pt has no SE from meds. Blood sugars not checking Denies: Polyuria, polydipsia, vision changes, neuropathy, hypoglycemia Recent pertinent labs: Lab Results  Component Value Date   HGBA1C 6.0 (H) 11/20/2020   HGBA1C 6.2 (H) 05/20/2020   HGBA1C 6.2 (H) 11/20/2019   Standard of care and health maintenance: Foot exam:  due - saw podiatry ACEI/ARB:  yes Statin:  yes  HLD - on lipitor 20 mg, good compliance  GERD - on protonix  BPH/ED on cialis daily and proscar  Smoking 5 cig per day - no desire to quit, denies frequent bronchitis or daily cough   Current Outpatient Medications:    aspirin 81 MG tablet, Take 81 mg by mouth daily. Take 1 tablet (81 mg) by mouth once daily, Disp: , Rfl:    atorvastatin (LIPITOR) 20 MG tablet, Take 1 tablet (20 mg total) by mouth at bedtime., Disp: 90 tablet, Rfl: 3   CINNAMON PO, Take 1,000 mg by mouth 2 (two) times daily. Reported on 05/06/2016,  Disp: , Rfl:    finasteride (PROSCAR) 5 MG tablet, Take 1 tablet (5 mg total) by mouth daily., Disp: 90 tablet, Rfl: 3   gabapentin (NEURONTIN) 300 MG capsule, Take 1 capsule (300 mg total) by mouth daily., Disp: 90 capsule, Rfl: 3   lisinopril (ZESTRIL) 30 MG tablet, Take 1 tablet (30 mg total) by mouth daily., Disp: 90 tablet, Rfl: 3   metFORMIN (GLUCOPHAGE) 500 MG tablet, Take 1 tablet (500 mg total) by mouth 2  (two) times daily with a meal., Disp: 180 tablet, Rfl: 3   Multiple Vitamins-Minerals (PRESERVISION AREDS 2 PO), Take by mouth., Disp: , Rfl:    naproxen (NAPROSYN) 500 MG tablet, , Disp: , Rfl:    nitroGLYCERIN (NITROSTAT) 0.4 MG SL tablet, Place 1 tablet (0.4 mg total) under the tongue every 5 (five) minutes as needed., Disp: 25 tablet, Rfl: 0   pantoprazole (PROTONIX) 40 MG tablet, TAKE 1 TABLET BY MOUTH EVERY DAY, Disp: 90 tablet, Rfl: 3   pyridoxine (B-6) 100 MG tablet, Take 100 mg by mouth daily., Disp: , Rfl:    tadalafil (CIALIS) 5 MG tablet, Take 5 mg by mouth daily. Reported on 05/06/2016, Disp: , Rfl:    traMADol (ULTRAM) 50 MG tablet, Take 1 tablet (50 mg total) by mouth every 8 (eight) hours as needed for severe pain. #20 to last the year until 11/19/2021, Disp: 20 tablet, Rfl: 0  Patient Active Problem List   Diagnosis Date Noted   Cervical radiculopathy 11/24/2019   Onychomycosis of left great toe 01/03/2019   Sciatica 10/27/2017   Rectal bleeding 05/07/2016   Internal hemorrhoids 05/07/2016   Hyperlipidemia    GERD (gastroesophageal reflux disease)    Hypertensive heart disease    Unspecified systolic (congestive) heart failure [facility])    ED (erectile dysfunction)    Diabetic neuropathy ([place])    S/P coronary artery stent placement 09/25/2013   CAD (coronary artery disease) 07/28/2013   Diabetes mellitus type II, controlled ([place]) 07/28/2013   Abnormal EKG 07/24/2013   Smoker 07/24/2013   Gallstone 07/17/2013   Inguinal hernia 07/17/2013    Past Surgical History:  Procedure Laterality Date   AMPUTATION Left 01/04/2019   Procedure: AMPUTATION RAY LEFT GREAT TOE;  Surgeon: Sharlotte Alamo, DPM;  Location: [facility] ORS;  Service: Podiatry;  Laterality: Left;   CARDIAC CATHETERIZATION  11/14   [place] x1   COLONOSCOPY  2004   [place]   COLONOSCOPY WITH PROPOFOL N/A 06/22/2016   Procedure: COLONOSCOPY WITH PROPOFOL;  Surgeon: Robert Bellow, MD;  Location: [facility] ENDOSCOPY;  Service:  Endoscopy;  Laterality: N/A;   COLONOSCOPY WITH PROPOFOL N/A 06/28/2019   Procedure: COLONOSCOPY WITH PROPOFOL;  Surgeon: Lin Landsman, MD;  Location: [place];  Service: Endoscopy;  Laterality: N/A;  Diabetic - oral meds   ELBOW ARTHROSCOPY  2001   lt   ESOPHAGOGASTRODUODENOSCOPY (EGD) WITH PROPOFOL N/A 06/28/2019   Procedure: ESOPHAGOGASTRODUODENOSCOPY (EGD) WITH PROPOFOL;  Surgeon: Lin Landsman, MD;  Location: [place];  Service: Endoscopy;  Laterality: N/A;   GUM SURGERY     OLECRANON BURSECTOMY  11/12/2011   Procedure: OLECRANON BURSA;  Surgeon: Linna Hoff;  Location: [place];  Service: Orthopedics;  Laterality: Right;  olecracnon bursectomy with delayed closure   POLYPECTOMY  06/28/2019   Procedure: POLYPECTOMY;  Surgeon: Lin Landsman, MD;  Location: [place];  Service: Endoscopy;;   WISDOM TOOTH EXTRACTION      Family History  Problem Relation Age  of Onset   Hypertension Father    Diabetes Father    Heart disease Father    Diabetes Mother    Heart disease Mother    Hypertension Mother    Cancer Mother        bladder   Diabetes Sister    Stroke Sister        mini stroke   Diabetes Brother    Diabetes Sister    Diabetes Sister    Diabetes Sister    Diabetes Sister     Social History   Tobacco Use   Smoking status: Every Day    Packs/day: 0.50    Years: 47.00    Pack years: 23.50    Types: Cigarettes    Last attempt to quit: 11/09/2012    Years since quitting: 8.5   Smokeless tobacco: Never   Tobacco comments:    down to 5 cigs/day  Vaping Use   Vaping Use: Some days  Substance Use Topics   Alcohol use: Yes    Alcohol/week: 0.0 standard drinks    Comment: occasional (3-4x/yr)   Drug use: Yes     No Known Allergies  Health Maintenance  Topic Date Due   FOOT EXAM  05/20/2021   HEMOGLOBIN A1C  05/20/2021   COVID-19 Vaccine (1) 06/05/2021 (Originally 09/02/1961)   Zoster Vaccines-  Shingrix (1 of 2) 08/20/2021 (Originally 09/02/2006)   Pneumococcal Vaccine 31-22 Years old (2 - PCV) 05/20/2022 (Originally 10/01/2011)   INFLUENZA VACCINE  06/09/2021   OPHTHALMOLOGY EXAM  03/04/2022   COLONOSCOPY (Pts 45-61yrs Insurance coverage will need to be confirmed)  06/27/2024   TETANUS/TDAP  01/03/2029   Hepatitis C Screening  Completed   HIV Screening  Completed   HPV VACCINES  Aged Out    Chart Review Today: I personally reviewed active problem list, medication list, allergies, family history, social history, health maintenance, notes from last encounter, lab results, imaging with the patient/caregiver today.   Review of Systems  Constitutional: Negative.   HENT: Negative.    Eyes: Negative.   Respiratory: Negative.    Cardiovascular: Negative.   Gastrointestinal: Negative.   Endocrine: Negative.   Genitourinary: Negative.   Musculoskeletal: Negative.   Skin: Negative.   Allergic/Immunologic: Negative.   Neurological: Negative.   Hematological: Negative.   Psychiatric/Behavioral: Negative.    All other systems reviewed and are negative.   Objective:   Vitals:   05/20/21 0906 05/20/21 0921  BP: (!) 146/62 (!) 142/60  Pulse: 61   Resp: 16   Temp: 98 F (36.7 C)   SpO2: 96%   Weight: 164 lb (74.4 kg)   Height: 6' (1.829 m)     Body mass index is 22.24 kg/m.  Physical Exam Vitals and nursing note reviewed.  Constitutional:      General: He is not in acute distress.    Appearance: Normal appearance. He is normal weight. He is not ill-appearing, toxic-appearing or diaphoretic.  HENT:     Head: Normocephalic and atraumatic.     Right Ear: External ear normal.     Left Ear: External ear normal.  Eyes:     Conjunctiva/sclera: Conjunctivae normal.  Cardiovascular:     Rate and Rhythm: Normal rate and regular rhythm.     Pulses: Normal pulses.     Heart sounds: Normal heart sounds.  Pulmonary:     Effort: Pulmonary effort is normal. No tachypnea,  accessory muscle usage, respiratory distress or retractions.     Breath sounds: No decreased  air movement. Wheezing and rhonchi present. No rales.  Chest:     Chest wall: No tenderness.  Abdominal:     General: Bowel sounds are normal. There is no distension.     Palpations: Abdomen is soft.  Musculoskeletal:        General: No tenderness.     Right lower leg: No edema.     Left lower leg: No edema.  Skin:    Coloration: Skin is not jaundiced or pale.     Comments: Left great toe amputated No ulcers/callus  Neurological:     Mental Status: He is alert. Mental status is at baseline.  Psychiatric:        Mood and Affect: Mood normal.        Behavior: Behavior normal.     Diabetic Foot Exam - Simple   Simple Foot Form Diabetic Foot exam was performed with the following findings: Yes 05/20/2021  9:20 AM  Visual Inspection See comments: Yes Sensation Testing Intact to touch and monofilament testing bilaterally: Yes Pulse Check Posterior Tibialis and Dorsalis pulse intact bilaterally: Yes Comments Left great toe amputation, diffuse dry skin, some thickened nails, no long nails, erythema or other skin breakdown       Assessment & Plan:     ICD-10-CM   1. Diabetic polyneuropathy associated with type 2 diabetes mellitus (HCC)  E11.42 atorvastatin (LIPITOR) 20 MG tablet    metFORMIN (GLUCOPHAGE) 500 MG tablet    Hemoglobin A1C    lisinopril (ZESTRIL) 30 MG tablet   Pt saw podiatry, foot exam done today, compliant with metformin, recheck labs, DM well controlled on ACEI and statin    2. Controlled type 2 diabetes mellitus with other skin complication, without long-term current use of insulin (HCC)  E11.628 atorvastatin (LIPITOR) 20 MG tablet    metFORMIN (GLUCOPHAGE) 500 MG tablet    Hemoglobin A1C    lisinopril (ZESTRIL) 30 MG tablet   see above    3. Hypertension, unspecified type  B14 COMPLETE METABOLIC PANEL WITH GFR    lisinopril (ZESTRIL) 30 MG tablet   BP well  controlled today, pt compliant with lisinopril    4. Mixed hyperlipidemia  E78.2 atorvastatin (LIPITOR) 20 MG tablet    Lipid panel   compliant with statin, no SE or concerns, due for lipid panel and CMP    5. Chronic systolic congestive heart failure (HCC)  I50.22    no sx currently, appears euvolemic     6. Coronary artery disease involving native coronary artery of native heart without angina pectoris  I25.10    no current exertional sx    7. Current smoker  F17.200    discussed smoking cessation    8. Chronic obstructive pulmonary disease, unspecified COPD type ([place])  J44.9    suspected, rhonchi and wheeze on exam, pt doesn't want testing, treatment, imaging or work up on this, discussed watching sx and f/up so inhalers can be rx    9. Gastroesophageal reflux disease, unspecified whether esophagitis present  K21.9    on PPI, sx well controlled    10. Erectile dysfunction, unspecified erectile dysfunction type  N52.9    on meds, doing well        Return for 2-3 week nurse visit f/up for BP recheck and routine 6 month f/up HTN, DM.   Delsa Grana, PA-C 05/20/21 9:16 AM

## 2021-05-21 ENCOUNTER — Encounter: Payer: Self-pay | Admitting: Family Medicine

## 2021-05-21 DIAGNOSIS — J449 Chronic obstructive pulmonary disease, unspecified: Secondary | ICD-10-CM | POA: Insufficient documentation

## 2021-05-21 LAB — COMPLETE METABOLIC PANEL WITH GFR
AG Ratio: 1.6 (calc) (ref 1.0–2.5)
ALT: 23 U/L (ref 9–46)
AST: 24 U/L (ref 10–35)
Albumin: 4.3 g/dL (ref 3.6–5.1)
Alkaline phosphatase (APISO): 56 U/L (ref 35–144)
BUN: 18 mg/dL (ref 7–25)
CO2: 27 mmol/L (ref 20–32)
Calcium: 9.4 mg/dL (ref 8.6–10.3)
Chloride: 104 mmol/L (ref 98–110)
Creat: 0.98 mg/dL (ref 0.70–1.35)
Globulin: 2.7 g/dL (calc) (ref 1.9–3.7)
Glucose, Bld: 119 mg/dL — ABNORMAL HIGH (ref 65–99)
Potassium: 5.3 mmol/L (ref 3.5–5.3)
Sodium: 137 mmol/L (ref 135–146)
Total Bilirubin: 0.9 mg/dL (ref 0.2–1.2)
Total Protein: 7 g/dL (ref 6.1–8.1)
eGFR: 86 mL/min/{1.73_m2} (ref >=60)

## 2021-05-21 LAB — LIPID PANEL
Cholesterol: 116 mg/dL (ref <200)
HDL: 45 mg/dL (ref >=40)
LDL Cholesterol (Calc): 54 mg/dL (calc)
Non-HDL Cholesterol (Calc): 71 mg/dL (calc) (ref <130)
Total CHOL/HDL Ratio: 2.6 (calc) (ref <5.0)
Triglycerides: 89 mg/dL (ref <150)

## 2021-05-21 LAB — HEMOGLOBIN A1C
Hgb A1c MFr Bld: 5.9 % of total Hgb — ABNORMAL HIGH (ref <5.7)
Mean Plasma Glucose: 123 mg/dL
eAG (mmol/L): 6.8 mmol/L

## 2021-06-05 ENCOUNTER — Ambulatory Visit: Payer: Self-pay | Admitting: *Deleted

## 2021-06-05 NOTE — Telephone Encounter (Signed)
Reason for Disposition  Wants doctor to measure BP    Bought a new wrist BP cuff.  Instructed to monitor his BP last time in office.  Answer Assessment - Initial Assessment Questions 1. BLOOD PRESSURE: "What is the blood pressure?" "Did you take at least two measurements 5 minutes apart?"     124/72 now after eating 98/43.    They told me to keep a check on my BP.   It was a little high in the office. I bought a new BP machine.   So I'm checking it.    I'm not having any symptoms.    2. ONSET: "When did you take your blood pressure?"     Just a few minutes ago. 3. HOW: "How did you obtain the blood pressure?" (e.g., visiting nurse, automatic home BP monitor)     New BP machine     Wrist monitor.    4. HISTORY: "Do you have a history of low blood pressure?" "What is your blood pressure normally?"     No   Brian Dean wanted me to start checking it because it was a little elevated when I was in there the other day.   I'm supposed to come back in in a couple of weeks to have y'all recheck it. 5. MEDICATIONS: "Are you taking any medications for blood pressure?" If Yes, ask: "Have they been changed recently?"     Not asked 6. PULSE RATE: "Do you know what your pulse rate is?"      Not asked 7. OTHER SYMPTOMS: "Have you been sick recently?" "Have you had a recent injury?"     Denies feeling dizzy, weak, sweating, fatigue, headaches, blurry vision.   "I feel fine" 8. PREGNANCY: "Is there any chance you are pregnant?" "When was your last menstrual period?"     N/A  Protocols used: Blood Pressure - Low-A-AH

## 2021-06-05 NOTE — Telephone Encounter (Signed)
I returned pt's call.   He called in with fluctuating BP readings on his new wrist monitor BP machine.  124/72 then rechecks it after eating and it's 98/43.   "Every reading is really different".  He was instructed to buy a BP machine and monitor his BP because last time he was in the office his BP was slightly elevated.   He bought a new wrist monitor for obtaining his BP.     I went over the correct way to obtain his BP using that kind of monitor.   I emphasized it needed to be at heart level every time.   I also let him know his BP normally fluctuates throughout the day as he was measuring it multiple times a day.   I let him know a morning reading and an afternoon reading were fine unless he had been directed to do otherwise which he said he had not.     I advised him to bring the BP machine with him to his appt in a couple of weeks.   He was agreeable to this plan and thanked me for going over how to use it.

## 2021-06-10 ENCOUNTER — Ambulatory Visit: Payer: 59

## 2021-06-10 ENCOUNTER — Other Ambulatory Visit: Payer: Self-pay

## 2021-06-10 VITALS — BP 120/64

## 2021-06-10 DIAGNOSIS — Z0130 Encounter for examination of blood pressure without abnormal findings: Secondary | ICD-10-CM

## 2021-07-24 ENCOUNTER — Other Ambulatory Visit: Payer: Self-pay

## 2021-07-24 ENCOUNTER — Encounter: Payer: Self-pay | Admitting: Nurse Practitioner

## 2021-07-24 ENCOUNTER — Telehealth (INDEPENDENT_AMBULATORY_CARE_PROVIDER_SITE_OTHER): Payer: 59 | Admitting: Nurse Practitioner

## 2021-07-24 VITALS — Temp 98.4°F

## 2021-07-24 DIAGNOSIS — J449 Chronic obstructive pulmonary disease, unspecified: Secondary | ICD-10-CM | POA: Diagnosis not present

## 2021-07-24 DIAGNOSIS — U071 COVID-19: Secondary | ICD-10-CM | POA: Diagnosis not present

## 2021-07-24 MED ORDER — NIRMATRELVIR/RITONAVIR (PAXLOVID)TABLET: 3.0000 | ORAL_TABLET | Freq: Two times a day (BID) | ORAL | 0 refills | Status: AC

## 2021-07-24 NOTE — Progress Notes (Signed)
Name: Brian Dean   MRN: [id number]    DOB: [birth year]   Date:07/24/2021       Progress Note  Subjective  Chief Complaint  Chief Complaint  Patient presents with   Covid Positive    Home test x2 positive. Runny nose, cough, fever, weak    I connected with  Brian Dean  on 07/24/21 at  1:00 PM EDT by a video enabled telemedicine application and verified that I am speaking with the correct person using two identifiers.  I discussed the limitations of evaluation and management by telemedicine and the availability of in person appointments. The patient expressed understanding and agreed to proceed with a virtual visit  Staff also discussed with the patient that there may be a patient responsible charge related to this service. Patient Location: home Provider Location: [facility] Additional Individuals present: wife Brian Dean)  HPI  Covid-19: He tested for Covid last night and was positive.  His wife is also positive.  He says his symptoms started last night.  Symptoms included runny nose, non-productive cough, fever (101.5) and fatigue.  He denies any shortness of breath, wheezing or chest pain.  He says he took tylenol for the fever and Nyquil before going to bed.  He wanted to know if he qualified for Paxlovid.  Discussed side effects and potential for rebound of symptoms.  He would like to take it.  Discussed pushing fluids and OTC medications to help with symptoms.  Discussed CDC quarantine guidelines. Discussed concerning signs and symptoms to go to the emergency department. He is agreeable to plan.  COPD: He is a current smoker.  Is not currently taking any medication for his COPD.  He denies any shortness of breath, or wheezing.    Patient Active Problem List   Diagnosis Date Noted   Chronic obstructive pulmonary disease ([place]) 05/21/2021   Cervical radiculopathy 11/24/2019   Onychomycosis of left great toe 01/03/2019   Sciatica 10/27/2017   Rectal bleeding 05/07/2016   Internal  hemorrhoids 05/07/2016   Hyperlipidemia    GERD (gastroesophageal reflux disease)    Hypertensive heart disease    Unspecified systolic (congestive) heart failure [facility])    ED (erectile dysfunction)    Diabetic neuropathy ([place])    S/P coronary artery stent placement 09/25/2013   CAD (coronary artery disease) 07/28/2013   Diabetes mellitus type II, controlled ([place]) 07/28/2013   Abnormal EKG 07/24/2013   Smoker 07/24/2013   Gallstone 07/17/2013   Inguinal hernia 07/17/2013    Social History   Tobacco Use   Smoking status: Every Day    Packs/day: 0.50    Years: 47.00    Pack years: 23.50    Types: Cigarettes    Last attempt to quit: 11/09/2012    Years since quitting: 8.7   Smokeless tobacco: Never   Tobacco comments:    down to 5 cigs/day  Substance Use Topics   Alcohol use: Yes    Alcohol/week: 0.0 standard drinks    Comment: occasional (3-4x/yr)     Current Outpatient Medications:    aspirin 81 MG tablet, Take 81 mg by mouth daily. Take 1 tablet (81 mg) by mouth once daily, Disp: , Rfl:    atorvastatin (LIPITOR) 20 MG tablet, Take 1 tablet (20 mg total) by mouth at bedtime., Disp: 90 tablet, Rfl: 3   CINNAMON PO, Take 1,000 mg by mouth 2 (two) times daily. Reported on 05/06/2016, Disp: , Rfl:    finasteride (PROSCAR) 5 MG tablet, Take 1  tablet (5 mg total) by mouth daily., Disp: 90 tablet, Rfl: 3   gabapentin (NEURONTIN) 300 MG capsule, Take 1 capsule (300 mg total) by mouth daily., Disp: 90 capsule, Rfl: 3   lisinopril (ZESTRIL) 30 MG tablet, Take 1 tablet (30 mg total) by mouth daily., Disp: 90 tablet, Rfl: 3   metFORMIN (GLUCOPHAGE) 500 MG tablet, Take 1 tablet (500 mg total) by mouth 2 (two) times daily with a meal., Disp: 180 tablet, Rfl: 3   Multiple Vitamins-Minerals (PRESERVISION AREDS 2 PO), Take by mouth., Disp: , Rfl:    naproxen (NAPROSYN) 500 MG tablet, , Disp: , Rfl:    nitroGLYCERIN (NITROSTAT) 0.4 MG SL tablet, Place 1 tablet (0.4 mg total) under the tongue  every 5 (five) minutes as needed., Disp: 25 tablet, Rfl: 0   pantoprazole (PROTONIX) 40 MG tablet, TAKE 1 TABLET BY MOUTH EVERY DAY, Disp: 90 tablet, Rfl: 3   pyridoxine (B-6) 100 MG tablet, Take 100 mg by mouth daily., Disp: , Rfl:    tadalafil (CIALIS) 5 MG tablet, Take 5 mg by mouth daily. Reported on 05/06/2016, Disp: , Rfl:    traMADol (ULTRAM) 50 MG tablet, Take 1 tablet (50 mg total) by mouth every 8 (eight) hours as needed for severe pain. #20 to last the year until 11/19/2021, Disp: 20 tablet, Rfl: 0  No Known Allergies  I personally reviewed active problem list, medication list, allergies, social history with the patient/caregiver today.  ROS  Constitutional:Positive for fever, negative  weight change.  Respiratory: Positive for cough, negative for shortness of breath.   Cardiovascular: Negative for chest pain or palpitations.  Gastrointestinal: Negative for abdominal pain, no bowel changes.  Musculoskeletal: Negative for gait problem or joint swelling.  Skin: Negative for rash.  Neurological: Negative for dizziness or headache.  No other specific complaints in a complete review of systems (except as listed in HPI above).   Objective  Virtual encounter, vitals not obtained.  There is no height or weight on file to calculate BMI.  Nursing Note and Vital Signs reviewed.  Physical Exam  Awake alert and oriented, talking in full sentences  No results found for this or any previous visit (from the past 72 hour(s)).  Assessment & Plan  1. COVID-19 -push fluids -discussed OTC treatments for symptoms, CDC recommendations, concerning signs and symptoms to go to the emergency department - nirmatrelvir/ritonavir EUA (PAXLOVID) 20 x 150 MG & 10 x '100MG'$  TABS; Take 3 tablets by mouth 2 (two) times daily for 5 days. (Take nirmatrelvir 150 mg two tablets twice daily for 5 days and ritonavir 100 mg one tablet twice daily for 5 days) Patient GFR is 86  Dispense: 30 tablet; Refill:  0  2. Chronic obstructive pulmonary disease, unspecified COPD type ([place])   -Red flags and when to present for emergency care or RTC including fever >101.72F, chest pain, shortness of breath, new/worsening/un-resolving symptoms, reviewed with patient at time of visit. Follow up and care instructions discussed and provided in AVS. - I discussed the assessment and treatment plan with the patient. The patient was provided an opportunity to ask questions and all were answered. The patient agreed with the plan and demonstrated an understanding of the instructions.  I provided 25 minutes of non-face-to-face time during this encounter.  Bo Merino, FNP

## 2021-09-29 DIAGNOSIS — N401 Enlarged prostate with lower urinary tract symptoms: Secondary | ICD-10-CM | POA: Diagnosis not present

## 2021-09-29 DIAGNOSIS — D400 Neoplasm of uncertain behavior of prostate: Secondary | ICD-10-CM | POA: Diagnosis not present

## 2021-09-29 DIAGNOSIS — N5201 Erectile dysfunction due to arterial insufficiency: Secondary | ICD-10-CM | POA: Diagnosis not present

## 2021-09-29 DIAGNOSIS — Z79899 Other long term (current) drug therapy: Secondary | ICD-10-CM | POA: Diagnosis not present

## 2021-11-18 ENCOUNTER — Encounter: Payer: Self-pay | Admitting: Unknown Physician Specialty

## 2021-11-18 ENCOUNTER — Ambulatory Visit (INDEPENDENT_AMBULATORY_CARE_PROVIDER_SITE_OTHER): Payer: Medicare HMO | Admitting: Unknown Physician Specialty

## 2021-11-18 VITALS — BP 162/70 | HR 67 | Temp 97.8°F | Resp 16 | Ht 72.0 in | Wt 171.7 lb

## 2021-11-18 DIAGNOSIS — M545 Low back pain, unspecified: Secondary | ICD-10-CM

## 2021-11-18 DIAGNOSIS — I10 Essential (primary) hypertension: Secondary | ICD-10-CM

## 2021-11-18 DIAGNOSIS — Z89412 Acquired absence of left great toe: Secondary | ICD-10-CM | POA: Diagnosis not present

## 2021-11-18 DIAGNOSIS — M5431 Sciatica, right side: Secondary | ICD-10-CM

## 2021-11-18 DIAGNOSIS — E119 Type 2 diabetes mellitus without complications: Secondary | ICD-10-CM | POA: Diagnosis not present

## 2021-11-18 DIAGNOSIS — I2510 Atherosclerotic heart disease of native coronary artery without angina pectoris: Secondary | ICD-10-CM

## 2021-11-18 DIAGNOSIS — G8929 Other chronic pain: Secondary | ICD-10-CM | POA: Diagnosis not present

## 2021-11-18 DIAGNOSIS — Z23 Encounter for immunization: Secondary | ICD-10-CM | POA: Diagnosis not present

## 2021-11-18 DIAGNOSIS — F17200 Nicotine dependence, unspecified, uncomplicated: Secondary | ICD-10-CM

## 2021-11-18 DIAGNOSIS — E782 Mixed hyperlipidemia: Secondary | ICD-10-CM

## 2021-11-18 DIAGNOSIS — R69 Illness, unspecified: Secondary | ICD-10-CM | POA: Diagnosis not present

## 2021-11-18 MED ORDER — TRAMADOL HCL 50 MG PO TABS: 50.0000 mg | ORAL_TABLET | Freq: Three times a day (TID) | ORAL | 0 refills | Status: DC | PRN

## 2021-11-18 NOTE — Assessment & Plan Note (Signed)
Check Hgb A1C today.  Has been stable.  Continue present meds

## 2021-11-18 NOTE — Progress Notes (Signed)
BP (!) 162/70    Pulse 67    Temp 97.8 F (36.6 C) (Oral)    Resp 16    Ht 6' (1.829 m)    Wt 171 lb 11.2 oz (77.9 kg)    SpO2 99%    BMI 23.29 kg/m    Subjective:    Patient ID: Brian Dean, male    DOB: 22-Mar-1956, 66 y.o.   MRN: [id number]  HPI: Brian Dean is a 66 y.o. male  Chief Complaint  Patient presents with   Follow-up   Hypertension    Pt has not taken BP pill due to have not eaten a meal yet.   Diabetes   Diabetes: Using medications without difficulties- Takes Metformin BID No hypoglycemic episodes No hyperglycemic episodes Feet problems: none Blood Sugars averaging: eye exam within last year Last Hgb A1C: 5.9  Hypertension  Using medications without difficulty Average home BPs.  Check at home at 130/60-70   Using medication without problems or lightheadedness No chest pain with exertion or shortness of breath No Edema  Elevated Cholesterol Using medications without problems No Muscle aches Diet/Exercise:Does not exercise.  Reports diet as good.  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    Tramadol refill Pt takes the occasional Tramadol. Gets about #2/year.    Relevant past medical, surgical, family and social history reviewed and updated as indicated. Interim medical history since our last visit reviewed. Allergies and medications reviewed and updated.  Review of Systems  Per HPI unless specifically indicated above     Objective:    BP (!) 162/70    Pulse 67    Temp 97.8 F (36.6 C) (Oral)    Resp 16    Ht 6' (1.829 m)    Wt 171 lb 11.2 oz (77.9 kg)    SpO2 99%    BMI 23.29 kg/m   Wt Readings from Last 3 Encounters:  11/18/21 171 lb 11.2 oz (77.9 kg)  05/20/21 164 lb (74.4 kg)  12/20/20 170 lb (77.1 kg)    Physical Exam Constitutional:      General: He is not in acute distress.    Appearance: Normal appearance. He is well-developed.  HENT:     Head: Normocephalic and atraumatic.  Eyes:      General: Lids are normal. No scleral icterus.       Right eye: No discharge.        Left eye: No discharge.     Conjunctiva/sclera: Conjunctivae normal.  Neck:     Vascular: No carotid bruit or JVD.  Cardiovascular:     Rate and Rhythm: Normal rate and regular rhythm.     Heart sounds: Normal heart sounds.  Pulmonary:     Effort: Pulmonary effort is normal. No respiratory distress.     Breath sounds: Normal breath sounds.  Abdominal:     Palpations: There is no hepatomegaly or splenomegaly.  Musculoskeletal:        General: Normal range of motion.     Cervical back: Normal range of motion and neck supple.  Skin:    General: Skin is warm and dry.     Coloration: Skin is not pale.     Findings: No rash.  Neurological:     Mental Status: He is alert and oriented to person, place, and time.  Psychiatric:        Behavior: Behavior normal.        Thought Content:  Thought content normal.        Judgment: Judgment normal.    Results for orders placed or performed in visit on 05/20/21  Hemoglobin A1C  Result Value Ref Range   Hgb A1c MFr Bld 5.9 (H) <5.7 % of total Hgb   Mean Plasma Glucose 123 mg/dL   eAG (mmol/L) 6.8 mmol/L  COMPLETE METABOLIC PANEL WITH GFR  Result Value Ref Range   Glucose, Bld 119 (H) 65 - 99 mg/dL   BUN 18 7 - 25 mg/dL   Creat 0.98 0.70 - 1.35 mg/dL   eGFR 86 > OR = 60 mL/min/1.49m   BUN/Creatinine Ratio NOT APPLICABLE 6 - 22 (calc)   Sodium 137 135 - 146 mmol/L   Potassium 5.3 3.5 - 5.3 mmol/L   Chloride 104 98 - 110 mmol/L   CO2 27 20 - 32 mmol/L   Calcium 9.4 8.6 - 10.3 mg/dL   Total Protein 7.0 6.1 - 8.1 g/dL   Albumin 4.3 3.6 - 5.1 g/dL   Globulin 2.7 1.9 - 3.7 g/dL (calc)   AG Ratio 1.6 1.0 - 2.5 (calc)   Total Bilirubin 0.9 0.2 - 1.2 mg/dL   Alkaline phosphatase (APISO) 56 35 - 144 U/L   AST 24 10 - 35 U/L   ALT 23 9 - 46 U/L  Lipid panel  Result Value Ref Range   Cholesterol 116 <200 mg/dL   HDL 45 > OR = 40 mg/dL   Triglycerides 89  <150 mg/dL   LDL Cholesterol (Calc) 54 mg/dL (calc)   Total CHOL/HDL Ratio 2.6 <5.0 (calc)   Non-HDL Cholesterol (Calc) 71 <130 mg/dL (calc)      Assessment & Plan:   Problem List Items Addressed This Visit       Unprioritized   CAD (coronary artery disease) (Chronic)    Per cardiology who he sees yearly      Diabetes mellitus type II, controlled (HCC) (Chronic)    Check Hgb A1C today.  Has been stable.  Continue present meds      Relevant Orders   Hemoglobin A1c   Essential hypertension    BP not to goal but home readings OK. Did not take meds today      Relevant Orders   COMPLETE METABOLIC PANEL WITH GFR   Hyperlipidemia    Check lipid panel.  Continue present meds      Relevant Orders   Lipid panel   Smoker    Not interested in Quitting at this time      Other Visit Diagnoses     Need for influenza vaccination    -  Primary   Relevant Orders   Flu Vaccine QUAD High Dose(Fluad) (Completed)   Chronic midline low back pain without sciatica       Refill #20 Tramadol for occasional use.  Receives #20/year        Follow up plan: Return in about 3 months (around 02/16/2022).

## 2021-11-18 NOTE — Addendum Note (Signed)
Addended by: Kathrine Haddock on: 11/18/2021 10:02 AM   Modules accepted: Orders

## 2021-11-18 NOTE — Assessment & Plan Note (Addendum)
BP not to goal but home readings OK. Did not take meds today

## 2021-11-18 NOTE — Assessment & Plan Note (Signed)
Per cardiology who he sees yearly

## 2021-11-18 NOTE — Assessment & Plan Note (Signed)
Check lipid panel.  Continue present meds

## 2021-11-18 NOTE — Assessment & Plan Note (Signed)
Not interested in Quitting at this time

## 2021-12-08 ENCOUNTER — Other Ambulatory Visit: Payer: Self-pay | Admitting: Cardiovascular Disease

## 2021-12-20 ENCOUNTER — Other Ambulatory Visit: Payer: Self-pay | Admitting: Cardiovascular Disease

## 2021-12-22 MED ORDER — PANTOPRAZOLE SODIUM 40 MG PO TBEC: 40.0000 mg | DELAYED_RELEASE_TABLET | Freq: Every day | ORAL | 0 refills | Status: DC

## 2021-12-22 MED ORDER — NITROGLYCERIN 0.4 MG SL SUBL: SUBLINGUAL_TABLET | SUBLINGUAL | 0 refills | Status: DC

## 2021-12-22 NOTE — Telephone Encounter (Signed)
Please schedule 12 month F/U visit for further refills. Thank you!

## 2021-12-22 NOTE — Addendum Note (Signed)
Addended by: Janan Ridge on: 12/22/2021 01:42 PM   Modules accepted: Orders

## 2021-12-22 NOTE — Telephone Encounter (Signed)
Schedule

## 2022-01-08 ENCOUNTER — Other Ambulatory Visit: Payer: Self-pay | Admitting: Unknown Physician Specialty

## 2022-01-08 DIAGNOSIS — E782 Mixed hyperlipidemia: Secondary | ICD-10-CM | POA: Diagnosis not present

## 2022-01-08 DIAGNOSIS — L97522 Non-pressure chronic ulcer of other part of left foot with fat layer exposed: Secondary | ICD-10-CM | POA: Diagnosis not present

## 2022-01-08 DIAGNOSIS — L851 Acquired keratosis [keratoderma] palmaris et plantaris: Secondary | ICD-10-CM | POA: Diagnosis not present

## 2022-01-08 DIAGNOSIS — B351 Tinea unguium: Secondary | ICD-10-CM | POA: Diagnosis not present

## 2022-01-08 DIAGNOSIS — E1140 Type 2 diabetes mellitus with diabetic neuropathy, unspecified: Secondary | ICD-10-CM | POA: Diagnosis not present

## 2022-01-08 DIAGNOSIS — I10 Essential (primary) hypertension: Secondary | ICD-10-CM | POA: Diagnosis not present

## 2022-01-08 DIAGNOSIS — E119 Type 2 diabetes mellitus without complications: Secondary | ICD-10-CM | POA: Diagnosis not present

## 2022-01-09 LAB — COMPLETE METABOLIC PANEL WITH GFR
AG Ratio: 1.6 (calc) (ref 1.0–2.5)
ALT: 23 U/L (ref 9–46)
AST: 21 U/L (ref 10–35)
Albumin: 4.1 g/dL (ref 3.6–5.1)
Alkaline phosphatase (APISO): 53 U/L (ref 35–144)
BUN: 17 mg/dL (ref 7–25)
CO2: 27 mmol/L (ref 20–32)
Calcium: 9.2 mg/dL (ref 8.6–10.3)
Chloride: 105 mmol/L (ref 98–110)
Creat: 0.92 mg/dL (ref 0.70–1.35)
Globulin: 2.5 g/dL (calc) (ref 1.9–3.7)
Glucose, Bld: 129 mg/dL — ABNORMAL HIGH (ref 65–99)
Potassium: 4.9 mmol/L (ref 3.5–5.3)
Sodium: 138 mmol/L (ref 135–146)
Total Bilirubin: 0.7 mg/dL (ref 0.2–1.2)
Total Protein: 6.6 g/dL (ref 6.1–8.1)
eGFR: 92 mL/min/{1.73_m2} (ref >=60)

## 2022-01-09 LAB — HEMOGLOBIN A1C
Hgb A1c MFr Bld: 6.3 % of total Hgb — ABNORMAL HIGH (ref <5.7)
Mean Plasma Glucose: 134 mg/dL
eAG (mmol/L): 7.4 mmol/L

## 2022-01-09 LAB — LIPID PANEL
Cholesterol: 134 mg/dL (ref <200)
HDL: 46 mg/dL (ref >=40)
LDL Cholesterol (Calc): 72 mg/dL (calc)
Non-HDL Cholesterol (Calc): 88 mg/dL (calc) (ref <130)
Total CHOL/HDL Ratio: 2.9 (calc) (ref <5.0)
Triglycerides: 80 mg/dL (ref <150)

## 2022-01-19 NOTE — Progress Notes (Unsigned)
Continue me via and does not rebound with surgery cardiology Office Note  Date:  01/19/2022   ID:  Brian Dean, DOB [birth year], MRN [id number]  PCP:  Delsa Grana, PA-C   No chief complaint on file.   HPI:  Brian Dean is a 66 y.o. 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 07/26/2013 Previous Stress Myoview showed what appears to be a proximally occluded RCA with peri-infarct ischemia.  Left anterior descending and left circumflex territory appear intact with no perfusion abnormality. Ejection fraction 47% with wall motion abnormality in the inferior wall. 07/08/2013 - 07/09/2013 Hospitalized for acute pyelonephritis, acute on chronic low back pain, hyponatremia, diabetes, hypertension 07/07/2013 CT scan shows large calcified stone in the gallbladder neck, enlarged prostate He denied having any symptoms from his gallbladder stones     PMH:   has a past medical history of Anemia, Arthritis, CAD (coronary artery  disease) (Nov 2014), Diabetes mellitus without complication ([place]), Diabetic neuropathy ([place]), Dyslipidemia, ED (erectile dysfunction), GERD (gastroesophageal reflux disease), History of acute pyelonephritis (Aug. 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  PSH:    Past Surgical History:  Procedure Laterality Date   AMPUTATION Left 01/04/2019   Procedure: AMPUTATION RAY LEFT GREAT TOE;  Surgeon: Sharlotte Alamo, DPM;  Location: [facility];  Service: Podiatry;  Laterality: Left;   CARDIAC CATHETERIZATION  11/14   [place] x1   COLONOSCOPY  2004   [place]   COLONOSCOPY WITH PROPOFOL N/A 06/22/2016   Procedure: COLONOSCOPY WITH PROPOFOL;  Surgeon: Robert Bellow, MD;  Location: [facility];  Service: Endoscopy;  Laterality: N/A;   COLONOSCOPY WITH PROPOFOL N/A 06/28/2019   Procedure: COLONOSCOPY WITH PROPOFOL;  Surgeon: Lin Landsman, MD;  Location: [place];  Service: Endoscopy;  Laterality: N/A;  Diabetic - oral meds   ELBOW ARTHROSCOPY  2001   lt   ESOPHAGOGASTRODUODENOSCOPY (EGD) WITH PROPOFOL N/A 06/28/2019   Procedure: ESOPHAGOGASTRODUODENOSCOPY (EGD) WITH PROPOFOL;  Surgeon: Lin Landsman, MD;  Location: [place];  Service: Endoscopy;  Laterality: N/A;   GUM SURGERY     OLECRANON BURSECTOMY  11/12/2011   Procedure: OLECRANON BURSA;  Surgeon: Linna Hoff;  Location: [place];  Service: Orthopedics;  Laterality: Right;  olecracnon bursectomy with delayed closure   POLYPECTOMY  06/28/2019   Procedure: POLYPECTOMY;  Surgeon: Lin Landsman, MD;  Location: [place];  Service: Endoscopy;;   WISDOM TOOTH EXTRACTION      Current Outpatient Medications  Medication Sig Dispense Refill   aspirin 81  MG tablet Take 81 mg by mouth daily. Take 1 tablet (81 mg) by mouth once daily     atorvastatin (LIPITOR) 20 MG tablet Take 1 tablet (20 mg total) by mouth at bedtime. 90 tablet 3   CINNAMON PO Take 1,000 mg by mouth 2 (two) times daily. Reported on 05/06/2016     finasteride (PROSCAR) 5 MG tablet Take 1 tablet (5 mg total) by mouth daily. 90 tablet 3   gabapentin (NEURONTIN) 300 MG capsule Take 1 capsule (300 mg total) by mouth daily. 90 capsule 3   lisinopril (ZESTRIL) 30 MG tablet Take 1 tablet (30 mg total) by mouth daily. 90 tablet 3   metFORMIN (GLUCOPHAGE) 500 MG tablet Take 1 tablet (500 mg total) by mouth 2 (two) times daily with a meal. 180 tablet 3   Multiple Vitamins-Minerals (PRESERVISION AREDS 2 PO) Take by mouth.     nitroGLYCERIN (NITROSTAT) 0.4 MG SL tablet PLACE 1 TABLET UNDER THE TONGUE EVERY 5 MINUTES AS NEEDED. 25 tablet 0   pantoprazole (PROTONIX) 40 MG tablet Take 1 tablet (40 mg total) by mouth daily. 90 tablet 0   pyridoxine (B-6) 100 MG tablet Take 100 mg by mouth daily.     tadalafil (CIALIS) 5 MG tablet Take 5 mg by mouth daily. Reported on 05/06/2016     traMADol (ULTRAM) 50 MG tablet Take 1 tablet (50 mg total) by mouth every 8 (eight) hours as needed for severe pain. #20 to last the year until 11/19/2021 20 tablet 0   No current facility-administered medications for this visit.    Allergies:   Patient has no known allergies.   Social History:  The patient  reports that he has been smoking cigarettes. He has a 23.50 pack-year smoking history. He has never used smokeless tobacco. He reports current alcohol use. He reports current drug use.   Family History:   family history includes Cancer in his mother; Diabetes in his brother, father, mother, sister, sister, sister, sister, and sister; Heart disease in his father and mother; Hypertension in his father and mother; Stroke in his sister.   Review of Systems: Review of Systems  Constitutional: Negative.    Respiratory: Negative.    Cardiovascular: Negative.   Gastrointestinal: Negative.   Musculoskeletal: Negative.   Neurological: Negative.   Psychiatric/Behavioral: Negative.    All other systems reviewed and are negative.  PHYSICAL EXAM: VS:  There were no vitals taken for this visit. , BMI There is no height or weight on file to calculate BMI.  Constitutional:  oriented to person, place, and time. No distress.  HENT:  Head: Grossly normal Eyes:  no discharge. No scleral icterus.  Neck: No JVD, no carotid bruits  Cardiovascular: Regular rate and rhythm, no murmurs appreciated Pulmonary/Chest: Clear to auscultation bilaterally, no wheezes or rails Abdominal: Soft.  no distension.  no tenderness.  Musculoskeletal: Normal range of motion Neurological:  normal muscle tone. Coordination normal. No atrophy Skin: Skin warm and dry Psychiatric: normal affect, pleasant   Recent Labs: 01/08/2022: ALT 23; BUN 17; Creat 0.92; Potassium 4.9; Sodium 138    Lipid Panel Lab Results  Component Value Date   CHOL 134 01/08/2022   HDL 46 01/08/2022   LDLCALC 72 01/08/2022   TRIG 80 01/08/2022     WEIGHT LOSS: Wt Readings from Last 3 Encounters:  11/18/21 171  lb 11.2 oz (77.9 kg)  05/20/21 164 lb (74.4 kg)  12/20/20 170 lb (77.1 kg)      ASSESSMENT AND PLAN:  Hypertensive heart disease without heart failure Blood pressure is well controlled on today's visit. No changes made to the medications.  Coronary artery disease involving native coronary artery of native heart without angina pectoris Currently with no symptoms of angina. No further workup at this time. Continue current medication regimen. Still smoking  Mixed hyperlipidemia Cholesterol is at goal on the current lipid regimen. No changes to the medications were made.  Type 2 diabetes mellitus without complication, without long-term current use of insulin (HCC) Plan: A1C 6.0, stable  Smoker Still smoking Does not want  chantix  S/P coronary artery stent placement No further testing  Total encounter time more than 25 minutes Greater than 50% was spent in counseling and coordination of care with the patient      Signed, Esmond Plants, M.D., Ph.D. 01/19/2022  [place], [place]

## 2022-01-20 ENCOUNTER — Other Ambulatory Visit: Payer: Self-pay

## 2022-01-20 ENCOUNTER — Ambulatory Visit: Payer: Medicare HMO | Admitting: Cardiovascular Disease

## 2022-01-20 VITALS — BP 167/80 | HR 61 | Ht 72.0 in | Wt 169.0 lb

## 2022-01-20 DIAGNOSIS — F17200 Nicotine dependence, unspecified, uncomplicated: Secondary | ICD-10-CM

## 2022-01-20 DIAGNOSIS — R69 Illness, unspecified: Secondary | ICD-10-CM | POA: Diagnosis not present

## 2022-01-20 DIAGNOSIS — E119 Type 2 diabetes mellitus without complications: Secondary | ICD-10-CM | POA: Diagnosis not present

## 2022-01-20 DIAGNOSIS — E782 Mixed hyperlipidemia: Secondary | ICD-10-CM

## 2022-01-20 DIAGNOSIS — I119 Hypertensive heart disease without heart failure: Secondary | ICD-10-CM

## 2022-01-20 DIAGNOSIS — I25118 Atherosclerotic heart disease of native coronary artery with other forms of angina pectoris: Secondary | ICD-10-CM | POA: Diagnosis not present

## 2022-01-20 NOTE — Patient Instructions (Addendum)
Medication Instructions:  ?No changes ? ?If you need a refill on your cardiac medications before your next appointment, please call your pharmacy.  ? ?Lab work: ?No new labs needed ? ?Testing/Procedures: ?No new testing needed ? ?Follow-Up: ?At [facility], you and your health needs are our priority.  As part of our continuing mission to provide you with exceptional heart care, we have created designated Provider Care Teams.  These Care Teams include your primary Cardiologist (physician) and Advanced Practice Providers (APPs -  Physician Assistants and Nurse Practitioners) who all work together to provide you with the care you need, when you need it. ? ?You will need a follow up appointment in 12 months ? ?Providers on your designated Care Team:   ?Murray Hodgkins, NP ?Christell Faith, PA-C ?Cadence Kathlen Mody, PA-C ? ?COVID-19 Vaccine Information can be found at: [email or web address].[email or web address] For questions related to vaccine distribution or appointments, please email [email or web address] or call [phone].  ? ?Please monitor blood pressures and keep a log of your readings.  ?Call the clinic in 2 weeks with BP readings.  ? ?How to use a home blood pressure monitor. ?Be still. ?Measure at the same time every day. It?s important to take the readings at the same time each day, such as morning and evening. Take reading approximately 1 1/2 to 2 hours after BP medications. ? ? AVOID these things for 30 minutes before checking your blood pressure: ?Drinking caffeine. ?Drinking alcohol. ?Eating. ?Smoking. ?Exercising. ?  ?Five minutes before checking your blood pressure: ?Pee. ?Sit in a dining chair. Avoid sitting in a soft couch or armchair. ?Be quiet. Do not talk. ?   ?   Sit correctly. Sit with your back straight and supported (on a dining chair, rather than a sofa). Your feet should be flat on the floor and your legs should not be crossed. Your arm should be supported on a  flat surface (such as a table) with the upper arm at heart level. Make sure the bottom of the cuff is placed directly above the bend of the elbow.  ? ?

## 2022-02-04 DIAGNOSIS — E1140 Type 2 diabetes mellitus with diabetic neuropathy, unspecified: Secondary | ICD-10-CM | POA: Diagnosis not present

## 2022-02-04 DIAGNOSIS — L97521 Non-pressure chronic ulcer of other part of left foot limited to breakdown of skin: Secondary | ICD-10-CM | POA: Diagnosis not present

## 2022-02-16 ENCOUNTER — Encounter: Payer: Self-pay | Admitting: Internal Medicine

## 2022-02-16 ENCOUNTER — Ambulatory Visit (INDEPENDENT_AMBULATORY_CARE_PROVIDER_SITE_OTHER): Payer: Medicare HMO | Admitting: Internal Medicine

## 2022-02-16 VITALS — BP 130/68 | HR 74 | Temp 97.8°F | Resp 16 | Ht 72.0 in | Wt 167.7 lb

## 2022-02-16 DIAGNOSIS — I10 Essential (primary) hypertension: Secondary | ICD-10-CM | POA: Diagnosis not present

## 2022-02-16 DIAGNOSIS — K219 Gastro-esophageal reflux disease without esophagitis: Secondary | ICD-10-CM | POA: Diagnosis not present

## 2022-02-16 DIAGNOSIS — I2510 Atherosclerotic heart disease of native coronary artery without angina pectoris: Secondary | ICD-10-CM

## 2022-02-16 DIAGNOSIS — E1142 Type 2 diabetes mellitus with diabetic polyneuropathy: Secondary | ICD-10-CM | POA: Diagnosis not present

## 2022-02-16 DIAGNOSIS — R69 Illness, unspecified: Secondary | ICD-10-CM | POA: Diagnosis not present

## 2022-02-16 DIAGNOSIS — Z89412 Acquired absence of left great toe: Secondary | ICD-10-CM | POA: Diagnosis not present

## 2022-02-16 DIAGNOSIS — E1165 Type 2 diabetes mellitus with hyperglycemia: Secondary | ICD-10-CM | POA: Diagnosis not present

## 2022-02-16 DIAGNOSIS — F17200 Nicotine dependence, unspecified, uncomplicated: Secondary | ICD-10-CM

## 2022-02-16 NOTE — Assessment & Plan Note (Addendum)
Most recent A1c good at 6.3%, continue Metformin 500 BID ?

## 2022-02-16 NOTE — Assessment & Plan Note (Signed)
Stable, continue Lisinopril 30 mg daily. ? ?

## 2022-02-16 NOTE — Assessment & Plan Note (Signed)
Controlled with Gabapentin 300 once daily ?

## 2022-02-16 NOTE — Assessment & Plan Note (Signed)
Following with Podiatry to prevent reoccurrence, A1c well controlled. Discussed not going barefoot and doing nightly feet checks.  ?

## 2022-02-16 NOTE — Patient Instructions (Addendum)
It was great seeing you today! ? ?Plan discussed at today's visit: ?-No changes to medications ?-Pneumonia vaccine today ? ?Follow up in: 6 months  ? ?Take care and let us know if you have any questions or concerns prior to your next visit. ? ?Dr. Rosana Berger ? ?

## 2022-02-16 NOTE — Progress Notes (Signed)
? ?Established Patient Office Visit ? ?Subjective:  ?Patient ID: Brian Dean, male    DOB: 06/28/1956  Age: 66 y.o. MRN: [id number] ? ?CC:  ?Chief Complaint  ?Patient presents with  ? Follow-up  ? Diabetes  ? Hyperlipidemia  ? Hypertension  ? ? ?HPI ?Brian Dean presents for follow up on chronic medical conditions.  ? ?Hypertension: ?-Medications: Lisinopril 30 ?-Patient is compliant with above medications and reports no side effects. ?-Checking BP at home (average): 130-142/65-75 ?-Denies any SOB, CP, vision changes, LE edema or symptoms of hypotension ?-Follows with Cardiology ? ?HLD/CAD: ?-Medications: Lipitor 20 ?-Patient is compliant with above medications and reports no side effects.  ?-Last lipid panel: Lipid Panel  ?   ?Component Value Date/Time  ? CHOL 134 01/08/2022 1016  ? CHOL 129 10/31/2015 0924  ? TRIG 80 01/08/2022 1016  ? HDL 46 01/08/2022 1016  ? HDL 43 10/31/2015 0924  ? CHOLHDL 2.9 01/08/2022 1016  ? VLDL 19 04/27/2017 0917  ? LDLCALC 72 01/08/2022 1016  ? LABVLDL 14 10/31/2015 0924  ? ? ?Diabetes, Type 2 with polyneuropathy: ?-Last A1c 3/23; 6.3% ?-Medications: Metformin 500 BID, Gabapentin 300 once daily  ?-Patient is compliant with the above medications and reports no side effects.  ?-Checking BG at home: doesn't check  ?-Eye exam: scheduled in the next month  ?-Foot exam: UTD, follows podiatry  ?-Statin: yes ?-PNA vaccine: Prevnar 23 in 2011 ?-Denies symptoms of hypoglycemia, polyuria, polydipsia, numbness extremities, foot ulcers/trauma.  ? ?GERD: ?-Currently on Protonix 40 ? ?Health Maintenance: ?-Blood work UTD ?-Colonoscopy 06/2019: repeat in 5 years ? ?Past Medical History:  ?Diagnosis Date  ? Anemia   ? Arthritis   ? CAD (coronary artery disease) Nov 2014  ? diffuse, aggresive risk factor modification recommended  ? Diabetes mellitus without complication ([place])   ? Diabetic neuropathy ([place])   ? Dyslipidemia   ? ED (erectile dysfunction)   ? GERD (gastroesophageal reflux disease)    ? History of acute pyelonephritis Aug. 2014  ? hospitalized; 100k E. coli pansensitive  ? Hypercholesteremia   ? Hyperlipidemia   ? Hypertension   ? Hypertensive heart disease   ? Inguinal hernia   ? Osteomyelitis ([place])   ? Osteomyelitis of left foot ([place]) 01/03/2019  ? Sciatica   ? Syncope and collapse   ? Tobacco use   ? Unspecified systolic (congestive) heart failure ([place])   ? Wears dentures   ? partial upper and lower  ? ? ?Past Surgical History:  ?Procedure Laterality Date  ? AMPUTATION Left 01/04/2019  ? Procedure: AMPUTATION RAY LEFT GREAT TOE;  Surgeon: Sharlotte Alamo, DPM;  Location: [facility] ORS;  Service: Podiatry;  Laterality: Left;  ? CARDIAC CATHETERIZATION  11/14  ? [facility] x1  ? [facility]OSCOPY  2004  ? [facility]  ? COLONOSCOPY WITH PROPOFOL N/A 06/22/2016  ? Procedure: COLONOSCOPY WITH PROPOFOL;  Surgeon: Robert Bellow, MD;  [facility] END[facility];  Service: Endoscopy;  Laterality: N/A;  ? COLONOSCOPY WITH PROPOFOL N/A 06/28/2019  ? Procedure: COLONOSCOPY WITH PROPOFOL;  Surgeon: Lin Landsman, MD;  Location: [place];  Service: Endoscopy;  Laterality: N/A;  Diabetic - oral meds  ? ELBOW ARTHROSCOPY  2001  ? lt  ? ESOPHAGOGASTRODUODENOSCOPY (EGD) WITH PROPOFOL N/A 06/28/2019  ? Procedure: ESOPHAGOGASTRODUODENOSCOPY (EGD) WITH PROPOFOL;  Surgeon: Lin Landsman, MD;  Location: [place];  Service: Endoscopy;  Laterality: N/A;  ? GUM SURGERY    ? OLECRANON BURSECTOMY  11/12/2011  ? Procedure: OLECRANON  BURSA;  Surgeon: Linna Hoff;  Location: [place];  Service: Orthopedics;  Laterality: Right;  olecracnon bursectomy with delayed closure  ? POLYPECTOMY  06/28/2019  ? Procedure: POLYPECTOMY;  Surgeon: Lin Landsman, MD;  Location: [place];  Service: Endoscopy;;  ? WISDOM TOOTH EXTRACTION    ? ? ?Family History  ?Problem Relation Age of Onset  ? Hypertension Father   ? Diabetes Father   ? Heart disease Father   ? Diabetes Mother   ? Heart disease Mother    ? Hypertension Mother   ? Cancer Mother   ?     bladder  ? Diabetes Sister   ? Stroke Sister   ?     mini stroke  ? Diabetes Brother   ? Diabetes Sister   ? Diabetes Sister   ? Diabetes Sister   ? Diabetes Sister   ? ? ?Social History  ? ?Socioeconomic History  ? Marital status: Married  ?  Spouse name: Not on file  ? Number of children: Not on file  ? Years of education: Not on file  ? Highest education level: Not on file  ?Occupational History  ? Not on file  ?Tobacco Use  ? Smoking status: Every Day  ?  Packs/day: 0.50  ?  Years: 47.00  ?  Pack years: 23.50  ?  Types: Cigarettes  ?  Last attempt to quit: 11/09/2012  ?  Years since quitting: 9.2  ? Smokeless tobacco: Never  ? Tobacco comments:  ?  down to 5 cigs/day  ?Vaping Use  ? Vaping Use: Some days  ?Substance and Sexual Activity  ? Alcohol use: Yes  ?  Alcohol/week: 0.0 standard drinks  ?  Comment: occasional (3-4x/yr)  ? Drug use: Yes  ? Sexual activity: Yes  ?  Birth control/protection: None  ?Other Topics Concern  ? Not on file  ?Social History Narrative  ? Not on file  ? ?Social Determinants of Health  ? ?Financial Resource Strain: Not on file  ?Food Insecurity: Not on file  ?Transportation Needs: Not on file  ?Physical Activity: Not on file  ?Stress: Not on file  ?Social Connections: Not on file  ?Intimate Partner Violence: Not on file  ? ? ?Outpatient Medications Prior to Visit  ?Medication Sig Dispense Refill  ? aspirin 81 MG tablet Take 81 mg by mouth daily. Take 1 tablet (81 mg) by mouth once daily    ? atorvastatin (LIPITOR) 20 MG tablet Take 1 tablet (20 mg total) by mouth at bedtime. 90 tablet 3  ? CINNAMON PO Take 1,000 mg by mouth 2 (two) times daily. Reported on 05/06/2016    ? finasteride (PROSCAR) 5 MG tablet Take 1 tablet (5 mg total) by mouth daily. 90 tablet 3  ? gabapentin (NEURONTIN) 300 MG capsule Take 1 capsule (300 mg total) by mouth daily. 90 capsule 3  ? lisinopril (ZESTRIL) 30 MG tablet Take 1 tablet (30 mg total) by mouth daily. 90  tablet 3  ? metFORMIN (GLUCOPHAGE) 500 MG tablet Take 1 tablet (500 mg total) by mouth 2 (two) times daily with a meal. 180 tablet 3  ? Multiple Vitamins-Minerals (PRESERVISION AREDS 2 PO) Take by mouth.    ? nitroGLYCERIN (NITROSTAT) 0.4 MG SL tablet PLACE 1 TABLET UNDER THE TONGUE EVERY 5 MINUTES AS NEEDED. 25 tablet 0  ? pantoprazole (PROTONIX) 40 MG tablet Take 1 tablet (40 mg total) by mouth daily. 90 tablet 0  ? pyridoxine (B-6) 100  MG tablet Take 100 mg by mouth daily.    ? tadalafil (CIALIS) 5 MG tablet Take 5 mg by mouth daily. Reported on 05/06/2016    ? traMADol (ULTRAM) 50 MG tablet Take 1 tablet (50 mg total) by mouth every 8 (eight) hours as needed for severe pain. #20 to last the year until 11/19/2021 20 tablet 0  ? ?No facility-administered medications prior to visit.  ? ? ?No Known Allergies ? ?ROS ?Review of Systems  ?Constitutional:  Negative for chills and fever.  ?Eyes:  Negative for visual disturbance.  ?Respiratory:  Negative for cough and shortness of breath.   ?Cardiovascular:  Negative for chest pain.  ?Gastrointestinal:  Negative for abdominal pain.  ?Neurological:  Negative for dizziness and headaches.  ? ?  ?Objective:  ?  ?Physical Exam ?Constitutional:   ?   Appearance: Normal appearance.  ?HENT:  ?   Head: Normocephalic and atraumatic.  ?Eyes:  ?   Conjunctiva/sclera: Conjunctivae normal.  ?Cardiovascular:  ?   Rate and Rhythm: Normal rate and regular rhythm.  ?Pulmonary:  ?   Effort: Pulmonary effort is normal.  ?   Breath sounds: Normal breath sounds.  ?Musculoskeletal:  ?   Right lower leg: No edema.  ?   Left lower leg: No edema.  ?Skin: ?   General: Skin is warm and dry.  ?Neurological:  ?   General: No focal deficit present.  ?   Mental Status: He is alert. Mental status is at baseline.  ?Psychiatric:     ?   Mood and Affect: Mood normal.     ?   Behavior: Behavior normal.  ? ? ?BP 130/68   Pulse 74   Temp 97.8 ?F (36.6 ?C)   Resp 16   Ht 6' (1.829 m)   Wt 167 lb 11.2 oz  (76.1 kg)   BMI 22.74 kg/m?  ?Wt Readings from Last 3 Encounters:  ?01/20/22 169 lb (76.7 kg)  ?11/18/21 171 lb 11.2 oz (77.9 kg)  ?05/20/21 164 lb (74.4 kg)  ? ? ? ?Health Maintenance Due  ?Topic Date Due  ?

## 2022-02-16 NOTE — Assessment & Plan Note (Signed)
Following with Cardiology. Reviewed labs. Continue statin. ?

## 2022-02-16 NOTE — Assessment & Plan Note (Signed)
Well controlled with PPI, continue at current dose. ?

## 2022-02-16 NOTE — Assessment & Plan Note (Signed)
Patient smoking about 5 cigarettes a day and is not interested in quitting. Declines lung cancer screening. Was willing to do Prevnar 20 but unfortunately we are out of them today, discuss again at follow up.  ?

## 2022-03-06 DIAGNOSIS — H353132 Nonexudative age-related macular degeneration, bilateral, intermediate dry stage: Secondary | ICD-10-CM | POA: Diagnosis not present

## 2022-03-06 LAB — HM DIABETES EYE EXAM

## 2022-03-19 DIAGNOSIS — E1140 Type 2 diabetes mellitus with diabetic neuropathy, unspecified: Secondary | ICD-10-CM | POA: Diagnosis not present

## 2022-03-19 DIAGNOSIS — B351 Tinea unguium: Secondary | ICD-10-CM | POA: Diagnosis not present

## 2022-03-19 DIAGNOSIS — L851 Acquired keratosis [keratoderma] palmaris et plantaris: Secondary | ICD-10-CM | POA: Diagnosis not present

## 2022-04-23 ENCOUNTER — Other Ambulatory Visit: Payer: Self-pay | Admitting: Cardiovascular Disease

## 2022-04-23 ENCOUNTER — Other Ambulatory Visit: Payer: Self-pay | Admitting: Family Medicine

## 2022-04-24 ENCOUNTER — Other Ambulatory Visit: Payer: Self-pay

## 2022-04-24 NOTE — Telephone Encounter (Signed)
Spoke with [facility] pharmacist at [facility] in [place] to cancel the refill that was sent @ 4:40 pm for Pantoprazole.  Patient needs to contact his primary care physician for a refill.

## 2022-06-07 ENCOUNTER — Other Ambulatory Visit: Payer: Self-pay | Admitting: Cardiovascular Disease

## 2022-06-08 ENCOUNTER — Telehealth: Payer: Self-pay | Admitting: Cardiovascular Disease

## 2022-06-08 MED ORDER — PANTOPRAZOLE SODIUM 40 MG PO TBEC: 40.0000 mg | DELAYED_RELEASE_TABLET | Freq: Every day | ORAL | 0 refills | Status: DC

## 2022-06-08 NOTE — Telephone Encounter (Signed)
*  STAT* If patient is at the pharmacy, call can be transferred to refill team.   1. Which medications need to be refilled? (please list name of each medication and dose if known)   pantoprazole (PROTONIX) 40 MG tablet  2. Which pharmacy/location (including street and city if local pharmacy) is medication to be sent to?  [id number]- [place], [place] - [address]. Do they need a 30 day or 90 day supply?   90 days  Patient stated he is completely out of this medication.

## 2022-06-08 NOTE — Telephone Encounter (Signed)
Pharmacy states they did not receive Rx sent in June.  Requested Prescriptions   Signed Prescriptions Disp Refills   pantoprazole (PROTONIX) 40 MG tablet 90 tablet 0    Sig: Take 1 tablet (40 mg total) by mouth daily.    Authorizing Provider: Minna Merritts    Ordering User: Raelene Bott, Zacherie Honeyman L

## 2022-06-19 DIAGNOSIS — E1140 Type 2 diabetes mellitus with diabetic neuropathy, unspecified: Secondary | ICD-10-CM | POA: Diagnosis not present

## 2022-06-19 DIAGNOSIS — B351 Tinea unguium: Secondary | ICD-10-CM | POA: Diagnosis not present

## 2022-06-19 DIAGNOSIS — L851 Acquired keratosis [keratoderma] palmaris et plantaris: Secondary | ICD-10-CM | POA: Diagnosis not present

## 2022-06-19 DIAGNOSIS — L97522 Non-pressure chronic ulcer of other part of left foot with fat layer exposed: Secondary | ICD-10-CM | POA: Diagnosis not present

## 2022-07-22 ENCOUNTER — Other Ambulatory Visit: Payer: Self-pay | Admitting: Family Medicine

## 2022-07-22 DIAGNOSIS — E782 Mixed hyperlipidemia: Secondary | ICD-10-CM

## 2022-07-22 DIAGNOSIS — I10 Essential (primary) hypertension: Secondary | ICD-10-CM

## 2022-07-22 DIAGNOSIS — E1142 Type 2 diabetes mellitus with diabetic polyneuropathy: Secondary | ICD-10-CM

## 2022-07-22 DIAGNOSIS — E11628 Type 2 diabetes mellitus with other skin complications: Secondary | ICD-10-CM

## 2022-08-03 DIAGNOSIS — E1140 Type 2 diabetes mellitus with diabetic neuropathy, unspecified: Secondary | ICD-10-CM | POA: Diagnosis not present

## 2022-08-03 DIAGNOSIS — L97521 Non-pressure chronic ulcer of other part of left foot limited to breakdown of skin: Secondary | ICD-10-CM | POA: Diagnosis not present

## 2022-08-18 ENCOUNTER — Ambulatory Visit (INDEPENDENT_AMBULATORY_CARE_PROVIDER_SITE_OTHER): Payer: Medicare HMO | Admitting: Internal Medicine

## 2022-08-18 ENCOUNTER — Encounter: Payer: Self-pay | Admitting: Internal Medicine

## 2022-08-18 VITALS — BP 140/78 | HR 75 | Temp 97.9°F | Resp 18 | Ht 72.0 in | Wt 166.7 lb

## 2022-08-18 DIAGNOSIS — Z89412 Acquired absence of left great toe: Secondary | ICD-10-CM | POA: Diagnosis not present

## 2022-08-18 DIAGNOSIS — E1165 Type 2 diabetes mellitus with hyperglycemia: Secondary | ICD-10-CM | POA: Diagnosis not present

## 2022-08-18 DIAGNOSIS — E1142 Type 2 diabetes mellitus with diabetic polyneuropathy: Secondary | ICD-10-CM

## 2022-08-18 DIAGNOSIS — G8929 Other chronic pain: Secondary | ICD-10-CM | POA: Diagnosis not present

## 2022-08-18 DIAGNOSIS — M5441 Lumbago with sciatica, right side: Secondary | ICD-10-CM

## 2022-08-18 DIAGNOSIS — Z23 Encounter for immunization: Secondary | ICD-10-CM | POA: Diagnosis not present

## 2022-08-18 DIAGNOSIS — K219 Gastro-esophageal reflux disease without esophagitis: Secondary | ICD-10-CM | POA: Diagnosis not present

## 2022-08-18 DIAGNOSIS — I10 Essential (primary) hypertension: Secondary | ICD-10-CM

## 2022-08-18 DIAGNOSIS — E782 Mixed hyperlipidemia: Secondary | ICD-10-CM

## 2022-08-18 LAB — POCT GLYCOSYLATED HEMOGLOBIN (HGB A1C): Hemoglobin A1C: 5.9 % — AB (ref 4.0–5.6)

## 2022-08-18 MED ORDER — PANTOPRAZOLE SODIUM 40 MG PO TBEC: 40.0000 mg | DELAYED_RELEASE_TABLET | Freq: Every day | ORAL | 1 refills | Status: DC

## 2022-08-18 MED ORDER — GABAPENTIN 300 MG PO CAPS: 300.0000 mg | ORAL_CAPSULE | Freq: Every day | ORAL | 0 refills | Status: DC

## 2022-08-18 MED ORDER — TRAMADOL HCL 50 MG PO TABS: 50.0000 mg | ORAL_TABLET | Freq: Three times a day (TID) | ORAL | 0 refills | Status: DC | PRN

## 2022-08-18 MED ORDER — METFORMIN HCL 500 MG PO TABS: 500.0000 mg | ORAL_TABLET | Freq: Two times a day (BID) | ORAL | 3 refills | Status: DC

## 2022-08-18 MED ORDER — ATORVASTATIN CALCIUM 20 MG PO TABS: ORAL_TABLET | ORAL | 3 refills | Status: DC

## 2022-08-18 MED ORDER — LISINOPRIL 30 MG PO TABS: 30.0000 mg | ORAL_TABLET | Freq: Every day | ORAL | 3 refills | Status: DC

## 2022-08-18 NOTE — Progress Notes (Signed)
Established Patient Office Visit  Subjective:  Patient ID: Brian Dean, male    DOB: [birth year]  Age: 66 y.o. MRN: [id number]  CC:  Chief Complaint  Patient presents with   Follow-up   Hypertension   Hyperlipidemia   Diabetes    HPI Brian Dean presents for follow up on chronic medical conditions.   Hypertension: -Medications: Lisinopril 30 mg -Patient is compliant with above medications and reports no side effects. -Checking BP at home (average): has a cuff but not checking -Denies any SOB, CP, vision changes, LE edema or symptoms of hypotension -Follows with Cardiology  HLD/CAD: -Medications: Lipitor 20 mg -Patient is compliant with above medications and reports no side effects.  -Last lipid panel: Lipid Panel     Component Value Date/Time   CHOL 134 01/08/2022 1016   CHOL 129 10/31/2015 0924   TRIG 80 01/08/2022 1016   HDL 46 01/08/2022 1016   HDL 43 10/31/2015 0924   CHOLHDL 2.9 01/08/2022 1016   VLDL 19 04/27/2017 0917   LDLCALC 72 01/08/2022 1016   LABVLDL 14 10/31/2015 0924    Diabetes, Type 2 with polyneuropathy: -Last A1c 3/23; 6.3% -Medications: Metformin 500 mg BID, Gabapentin 300 mg once daily  -Patient is compliant with the above medications and reports no side effects.  -Checking BG at home: doesn't check  -Eye exam: UTD 4/23 -Foot exam: UTD, follows podiatry last seen 08/03/22 -Statin: yes -PNA vaccine: Due  -Denies symptoms of hypoglycemia, polyuria, polydipsia, numbness extremities, foot ulcers/trauma.   GERD: -Currently on Protonix 40 mg  Lumbar Pain:  -Cannot take NSAIDs -Take Tramadol 50 mg rarely   Health Maintenance: -Blood work UTD -Colonoscopy 06/2019: repeat in 5 years  Past Medical History:  Diagnosis Date   Anemia    Arthritis    CAD (coronary artery disease) Nov 2014   diffuse, aggresive risk factor modification recommended   Diabetes mellitus without complication ([place])    Diabetic neuropathy ([place])     Dyslipidemia    ED (erectile dysfunction)    GERD (gastroesophageal reflux disease)    History of acute pyelonephritis Aug. 2014   hospitalized; 100k E. coli pansensitive   Hypercholesteremia    Hyperlipidemia    Hypertension    Hypertensive heart disease    Inguinal hernia    Osteomyelitis (HCC)    Osteomyelitis of left foot ([place]) 01/03/2019   Sciatica    Syncope and collapse    Tobacco use    Unspecified systolic (congestive) heart failure ([place])    Wears dentures    partial upper and lower    Past Surgical History:  Procedure Laterality Date   AMPUTATION Left 01/04/2019   Procedure: AMPUTATION RAY LEFT GREAT TOE;  Surgeon: Sharlotte Alamo, DPM;  Location: [facility];  Service: Podiatry;  Laterality: Left;   CARDIAC CATHETERIZATION  11/14   [place] x1   [facility]  [facility]   [facility]   COLONOSCOPY WITH PROPOFOL N/A 06/22/2016   Procedure: COLONOSCOPY WITH PROPOFOL;  Surgeon: Robert Bellow, MD;  Location: [facility] ENDOSCOPY;  Service: Endoscopy;  Laterality: N/A;   COLONOSCOPY WITH PROPOFOL N/A 06/28/2019   Procedure: COLONOSCOPY WITH PROPOFOL;  Surgeon: Lin Landsman, MD;  Location: [place];  Service: Endoscopy;  Laterality: N/A;  Diabetic - oral meds   ELBOW ARTHROSCOPY  2001   lt   ESOPHAGOGASTRODUODENOSCOPY (EGD) WITH PROPOFOL N/A 06/28/2019   Procedure: ESOPHAGOGASTRODUODENOSCOPY (EGD) WITH PROPOFOL;  Surgeon: Lin Landsman, MD;  Location: [place];  Service: Endoscopy;  Laterality: N/A;   GUM SURGERY     OLECRANON BURSECTOMY  11/12/2011   Procedure: OLECRANON BURSA;  Surgeon: Linna Hoff;  Location: [place];  Service: Orthopedics;  Laterality: Right;  olecracnon bursectomy with delayed closure   POLYPECTOMY  06/28/2019   Procedure: POLYPECTOMY;  Surgeon: Lin Landsman, MD;  Location: [place];  Service: Endoscopy;;   WISDOM TOOTH EXTRACTION      Family History  Problem Relation Age of Onset   Hypertension Father     Diabetes Father    Heart disease Father    Diabetes Mother    Heart disease Mother    Hypertension Mother    Cancer Mother        bladder   Diabetes Sister    Stroke Sister        mini stroke   Diabetes Brother    Diabetes Sister    Diabetes Sister    Diabetes Sister    Diabetes Sister     Social History   Socioeconomic History   Marital status: Married    Spouse name: Not on file   Number of children: Not on file   Years of education: Not on file   Highest education level: Not on file  Occupational History   Not on file  Tobacco Use   Smoking status: Every Day    Packs/day: 0.50    Years: 47.00    Total pack years: 23.50    Types: Cigarettes    Last attempt to quit: 11/09/2012    Years since quitting: 9.7   Smokeless tobacco: Never   Tobacco comments:    down to 5 cigs/day  Vaping Use   Vaping Use: Some days  Substance and Sexual Activity   Alcohol use: Yes    Alcohol/week: 0.0 standard drinks of alcohol    Comment: occasional (3-4x/yr)   Drug use: Yes   Sexual activity: Yes    Birth control/protection: None  Other Topics Concern   Not on file  Social History Narrative   Not on file   Social Determinants of Health   Financial Resource Strain: Not on file  Food Insecurity: Not on file  Transportation Needs: Not on file  Physical Activity: Not on file  Stress: Not on file  Social Connections: Not on file  Intimate Partner Violence: Not on file    Outpatient Medications Prior to Visit  Medication Sig Dispense Refill   aspirin 81 MG tablet Take 81 mg by mouth daily. Take 1 tablet (81 mg) by mouth once daily     CINNAMON PO Take 1,000 mg by mouth 2 (two) times daily. Reported on 05/06/2016     finasteride (PROSCAR) 5 MG tablet Take 1 tablet (5 mg total) by mouth daily. 90 tablet 3   Multiple Vitamins-Minerals (PRESERVISION AREDS 2 PO) Take by mouth.     nitroGLYCERIN (NITROSTAT) 0.4 MG SL tablet PLACE 1 TABLET UNDER THE TONGUE EVERY 5 MINUTES AS NEEDED.  25 tablet 0   pyridoxine (B-6) 100 MG tablet Take 100 mg by mouth daily.     tadalafil (CIALIS) 5 MG tablet Take 5 mg by mouth daily. Reported on 05/06/2016     atorvastatin (LIPITOR) 20 MG tablet TAKE 1 TABLET BY MOUTH EVERYDAY AT BEDTIME 90 tablet 3   gabapentin (NEURONTIN) 300 MG capsule TAKE 1 CAPSULE BY MOUTH EVERY DAY 90 capsule 0   lisinopril (ZESTRIL) 30 MG tablet TAKE 1 TABLET BY MOUTH EVERY DAY 90 tablet 3  metFORMIN (GLUCOPHAGE) 500 MG tablet TAKE 1 TABLET BY MOUTH 2 TIMES DAILY WITH A MEAL. 180 tablet 3   pantoprazole (PROTONIX) 40 MG tablet Take 1 tablet (40 mg total) by mouth daily. 90 tablet 0   traMADol (ULTRAM) 50 MG tablet Take 1 tablet (50 mg total) by mouth every 8 (eight) hours as needed for severe pain. #20 to last the year until 11/19/2021 20 tablet 0   No facility-administered medications prior to visit.    No Known Allergies  ROS Review of Systems  Constitutional:  Negative for chills and fever.  Eyes:  Negative for visual disturbance.  Respiratory:  Negative for cough and shortness of breath.   Cardiovascular:  Negative for chest pain.  Gastrointestinal:  Negative for abdominal pain.  Musculoskeletal:  Positive for back pain.  Neurological:  Negative for dizziness and headaches.      Objective:    Physical Exam Constitutional:      Appearance: Normal appearance.  HENT:     Head: Normocephalic and atraumatic.  Eyes:     Conjunctiva/sclera: Conjunctivae normal.  Cardiovascular:     Rate and Rhythm: Normal rate and regular rhythm.     Pulses:          Dorsalis pedis pulses are 2+ on the right side and 2+ on the left side.  Pulmonary:     Effort: Pulmonary effort is normal.     Breath sounds: Normal breath sounds.  Musculoskeletal:     Right lower leg: No edema.     Left lower leg: No edema.     Right foot: Normal range of motion. No deformity, bunion, Charcot foot, foot drop or prominent metatarsal heads.     Left foot: Normal range of motion. No  bunion, Charcot foot, foot drop or prominent metatarsal heads.     Left Lower Extremity: (great toe amputation) Feet:     Right foot:     Protective Sensation: 6 sites tested.  5 sites sensed.     Skin integrity: Skin integrity normal.     Toenail Condition: Right toenails are normal.     Left foot:     Protective Sensation: 6 sites tested.  5 sites sensed.     Skin integrity: Skin integrity normal.     Toenail Condition: Left toenails are normal.  Skin:    General: Skin is warm and dry.  Neurological:     General: No focal deficit present.     Mental Status: He is alert. Mental status is at baseline.  Psychiatric:        Mood and Affect: Mood normal.        Behavior: Behavior normal.     BP (!) 140/78   Pulse 75   Temp 97.9 F (36.6 C)   Resp 18   Ht 6' (1.829 m)   Wt 166 lb 11.2 oz (75.6 kg)   SpO2 96%   BMI 22.61 kg/m  Wt Readings from Last 3 Encounters:  08/18/22 166 lb 11.2 oz (75.6 kg)  02/16/22 167 lb 11.2 oz (76.1 kg)  01/20/22 169 lb (76.7 kg)     Health Maintenance Due  Topic Date Due   Diabetic kidney evaluation - Urine ACR  05/10/2020    There are no preventive care reminders to display for this patient.  No results found for: "TSH" Lab Results  Component Value Date   WBC 6.9 11/20/2020   HGB 14.0 11/20/2020   HCT 41.5 11/20/2020   MCV 93.3 11/20/2020  PLT 240 11/20/2020   Lab Results  Component Value Date   NA 138 01/08/2022   K 4.9 01/08/2022   CO2 27 01/08/2022   GLUCOSE 129 (H) 01/08/2022   BUN 17 01/08/2022   CREATININE 0.92 01/08/2022   BILITOT 0.7 01/08/2022   ALKPHOS 48 01/03/2019   AST 21 01/08/2022   ALT 23 01/08/2022   PROT 6.6 01/08/2022   ALBUMIN 3.7 01/03/2019   CALCIUM 9.2 01/08/2022   ANIONGAP 10 01/05/2019   EGFR 92 01/08/2022   Lab Results  Component Value Date   CHOL 134 01/08/2022   Lab Results  Component Value Date   HDL 46 01/08/2022   Lab Results  Component Value Date   LDLCALC 72 01/08/2022   Lab  Results  Component Value Date   TRIG 80 01/08/2022   Lab Results  Component Value Date   CHOLHDL 2.9 01/08/2022   Lab Results  Component Value Date   HGBA1C 5.9 (A) 08/18/2022      Assessment & Plan:   1. Controlled type 2 diabetes mellitus with hyperglycemia, without long-term current use of insulin (HCC)/Left great toe amputee ([place]): A1c in the office improved to 5.9%. Microalbumin and foot exam today. Continue Metformin 500 mg BID, refilled today. Following with Podiatry, reviewed note from 08/03/22.  - POCT HgB A1C - Urine Microalbumin w/creat. ratio - HM Diabetes Foot Exam - metFORMIN (GLUCOPHAGE) 500 MG tablet; Take 1 tablet (500 mg total) by mouth 2 (two) times daily with a meal.  Dispense: 180 tablet; Refill: 3  2. Diabetic polyneuropathy associated with type 2 diabetes mellitus ([place]): Stable, refill Gabapentin 300 mg once at night.   - gabapentin (NEURONTIN) 300 MG capsule; Take 1 capsule (300 mg total) by mouth daily.  Dispense: 90 capsule; Refill: 0  3. Hypertension, unspecified type: Blood pressure high at first, better on recheck. His sister is in hospice care and he is Dean to visit her later today, feeling more stressed than normal. Continue Lisinopril 30 mg, refilled today. Patient to monitor blood pressure at home. Follow up here in 6 months.   - lisinopril (ZESTRIL) 30 MG tablet; Take 1 tablet (30 mg total) by mouth daily.  Dispense: 90 tablet; Refill: 3  4. Mixed hyperlipidemia: Stable, refill Lipitor 20 mg daily.   - atorvastatin (LIPITOR) 20 MG tablet; TAKE 1 TABLET BY MOUTH EVERYDAY AT BEDTIME  Dispense: 90 tablet; Refill: 3  5. Gastroesophageal reflux disease, unspecified whether esophagitis present: Stable, refill Protonix 40 mg daily.   - pantoprazole (PROTONIX) 40 MG tablet; Take 1 tablet (40 mg total) by mouth daily.  Dispense: 90 tablet; Refill: 1  6. Chronic right-sided low back pain with right-sided sciatica: Refill Tramadol 50 mg PRN, PDMP  reviewed. Discussed right to drug tests, pill counts, etc.   - traMADol (ULTRAM) 50 MG tablet; Take 1 tablet (50 mg total) by mouth every 8 (eight) hours as needed for severe pain. #20 to last the year until 11/19/2021  Dispense: 20 tablet; Refill: 0  7. Need for pneumococcal 20-valent conjugate vaccination/Need for influenza vaccination: Flu and Prevnar 20 vaccines administered today.  - Pneumococcal conjugate vaccine 20-valent (Prevnar 20) - Flu Vaccine QUAD High Dose(Fluad)  Follow-up: Return in about 6 months (around 02/17/2023).    Teodora Medici, DO

## 2022-08-18 NOTE — Patient Instructions (Addendum)
It was great seeing you today!  Plan discussed at today's visit: -Medications refilled -A1c 5.9% today which is excellent -Flu and pneumonia vaccine today  Follow up in: 6 months   Take care and let us know if you have any questions or concerns prior to your next visit.  Dr. Rosana Berger

## 2022-08-19 LAB — MICROALBUMIN / CREATININE URINE RATIO
Creatinine, Urine: 32 mg/dL (ref 20–320)
Microalb Creat Ratio: 297 mcg/mg creat — ABNORMAL HIGH (ref <30)
Microalb, Ur: 9.5 mg/dL

## 2022-09-29 DIAGNOSIS — N401 Enlarged prostate with lower urinary tract symptoms: Secondary | ICD-10-CM | POA: Diagnosis not present

## 2022-09-29 DIAGNOSIS — N5201 Erectile dysfunction due to arterial insufficiency: Secondary | ICD-10-CM | POA: Diagnosis not present

## 2022-09-29 DIAGNOSIS — Z79899 Other long term (current) drug therapy: Secondary | ICD-10-CM | POA: Diagnosis not present

## 2022-09-29 DIAGNOSIS — D400 Neoplasm of uncertain behavior of prostate: Secondary | ICD-10-CM | POA: Diagnosis not present

## 2022-10-05 DIAGNOSIS — L851 Acquired keratosis [keratoderma] palmaris et plantaris: Secondary | ICD-10-CM | POA: Diagnosis not present

## 2022-10-05 DIAGNOSIS — E1140 Type 2 diabetes mellitus with diabetic neuropathy, unspecified: Secondary | ICD-10-CM | POA: Diagnosis not present

## 2022-10-05 DIAGNOSIS — B351 Tinea unguium: Secondary | ICD-10-CM | POA: Diagnosis not present

## 2022-10-05 DIAGNOSIS — L97521 Non-pressure chronic ulcer of other part of left foot limited to breakdown of skin: Secondary | ICD-10-CM | POA: Diagnosis not present

## 2022-11-05 DIAGNOSIS — E1140 Type 2 diabetes mellitus with diabetic neuropathy, unspecified: Secondary | ICD-10-CM | POA: Diagnosis not present

## 2022-11-05 DIAGNOSIS — L97521 Non-pressure chronic ulcer of other part of left foot limited to breakdown of skin: Secondary | ICD-10-CM | POA: Diagnosis not present

## 2022-11-05 DIAGNOSIS — R234 Changes in skin texture: Secondary | ICD-10-CM | POA: Diagnosis not present

## 2022-11-12 ENCOUNTER — Other Ambulatory Visit: Payer: Self-pay | Admitting: Internal Medicine

## 2022-11-12 DIAGNOSIS — E1142 Type 2 diabetes mellitus with diabetic polyneuropathy: Secondary | ICD-10-CM

## 2022-11-12 NOTE — Telephone Encounter (Signed)
Requested Prescriptions  Pending Prescriptions Disp Refills   gabapentin (NEURONTIN) 300 MG capsule [Pharmacy Med Name: GABAPENTIN 300 MG CAPSULE] 90 capsule 0    Sig: TAKE 1 CAPSULE BY MOUTH EVERY DAY     Neurology: Anticonvulsants - gabapentin Passed - 11/12/2022  2:38 PM      Passed - Cr in normal range and within 360 days    Creat  Date Value Ref Range Status  01/08/2022 0.92 0.70 - 1.35 mg/dL Final   Creatinine, Urine  Date Value Ref Range Status  08/18/2022 32 20 - 320 mg/dL Final         Passed - Completed PHQ-2 or PHQ-9 in the last 360 days      Passed - Valid encounter within last 12 months    Recent Outpatient Visits           2 months ago Controlled type 2 diabetes mellitus with hyperglycemia, without long-term current use of insulin [facility])   [place], DO   8 months ago Essential hypertension   [place], DO   11 months ago Need for influenza vaccination   [facility] Kathrine Haddock, NP   1 year ago COVID-19   [facility] Serafina Royals F, FNP   1 year ago Diabetic polyneuropathy associated with type 2 diabetes mellitus [facility])   [facility] Delsa Grana, PA-C       Future Appointments             In 3 months Teodora Medici, [facility], [facility]

## 2022-12-08 ENCOUNTER — Telehealth (INDEPENDENT_AMBULATORY_CARE_PROVIDER_SITE_OTHER): Payer: Medicare HMO | Admitting: Physician Assistant

## 2022-12-08 DIAGNOSIS — U071 COVID-19: Secondary | ICD-10-CM | POA: Diagnosis not present

## 2022-12-08 MED ORDER — NIRMATRELVIR/RITONAVIR (PAXLOVID)TABLET: 3.0000 | ORAL_TABLET | Freq: Two times a day (BID) | ORAL | 0 refills | Status: AC

## 2022-12-08 NOTE — Progress Notes (Signed)
     Virtual Visit via Video Note  I connected with Brian Dean on 12/08/22 at 11:00 AM EST by a video enabled telemedicine application and verified that I am speaking with the correct person using two identifiers.  Today's Provider: Talitha Givens, MHS, PA-C Introduced myself to the patient as a PA-C and provided education on APPs in clinical practice.   Location: Patient: at home  Provider: [place], [place]    I discussed the limitations of evaluation and management by telemedicine and the availability of in person appointments. The patient expressed understanding and agreed to proceed.    Chief Complaint  Patient presents with   Covid Positive    Yesterday evening, home test   Nasal Congestion   Cough   Fever   Chills    History of Present Illness:    COVID positive  Onset: sudden  Duration: started yesterday afternoon   Associated symptoms: congestion, rhinorrhea, coughing, fever  Fever: 99.9 max  COVID testing at home: tested yesterday evening   Results: positive   Recent sick contacts: Reports his wife tested positive last Friday   Interventions: Tylenol   He has had COVID before - Sept 7106  He denies complications during that previous bout  He was given Paxlovid for that illness   He takes Tadalafil every other day - last dose was day before yesterday   Observations/Objective:  Due to the nature of the virtual visit, physical exam and observations are limited. Able to obtain the following observations:   Alert, oriented, Appears comfortable, in no acute distress.  No scleral injection, no appreciated hoarseness, tachypnea, wheeze or strider. Able to maintain conversation without visible strain.  No cough appreciated during visit.   Assessment and Plan:  Problem List Items Addressed This Visit   None Visit Diagnoses     COVID-19    -  Primary Acute, new concern Reports his symptoms started yesterday and he had a  positive home COVID test Since he is still in the window for COVID antivirals - will send in Paxlovid  Reviewed medication interactions with Paxlovid with UTD drug interaction calculator- Recommend that patient dc Atorvastatin and Tramadol while taking Paxlovid, can resume once he is finished with course Also recommend he stop tadalafil while taking Paxlovid and resume 7 days after Paxlovid course is completed He voiced understanding to these recommendations  Reviewed OTC medications he can take for symptomatic management Follow up as needed for persistent or progressing symptoms    Relevant Medications   nirmatrelvir/ritonavir (PAXLOVID) 20 x 150 MG & 10 x '100MG'$  TABS      Follow Up Instructions:    I discussed the assessment and treatment plan with the patient. The patient was provided an opportunity to ask questions and all were answered. The patient agreed with the plan and demonstrated an understanding of the instructions.   The patient was advised to call back or seek an in-person evaluation if the symptoms worsen or if the condition fails to improve as anticipated.  I provided 12 minutes of non-face-to-face time during this encounter.  No follow-ups on file.   I, Kateri Balch E Kynzleigh Bandel, PA-C, have reviewed all documentation for this visit. The documentation on 12/08/22 for the exam, diagnosis, procedures, and orders are all accurate and complete.   Talitha Givens, MHS, PA-C [facility]

## 2022-12-08 NOTE — Patient Instructions (Addendum)
Please stop taking the tadalafil while taking the Paxlovid. You can restart the Tadalafil 7 days after you finish the course of Paxlovid  Do not take your Tramadol while taking the Paxlovid. You can restart that once the Paxlovid is finished  Please stop taking your Atorvastatin while taking the Paxlovid. You can restart it once you are finished with the Paxlovid course.

## 2023-01-06 DIAGNOSIS — E1140 Type 2 diabetes mellitus with diabetic neuropathy, unspecified: Secondary | ICD-10-CM | POA: Diagnosis not present

## 2023-01-06 DIAGNOSIS — B351 Tinea unguium: Secondary | ICD-10-CM | POA: Diagnosis not present

## 2023-01-06 DIAGNOSIS — L851 Acquired keratosis [keratoderma] palmaris et plantaris: Secondary | ICD-10-CM | POA: Diagnosis not present

## 2023-01-06 DIAGNOSIS — L97521 Non-pressure chronic ulcer of other part of left foot limited to breakdown of skin: Secondary | ICD-10-CM | POA: Diagnosis not present

## 2023-02-09 ENCOUNTER — Other Ambulatory Visit: Payer: Self-pay | Admitting: Internal Medicine

## 2023-02-09 DIAGNOSIS — K219 Gastro-esophageal reflux disease without esophagitis: Secondary | ICD-10-CM

## 2023-02-09 NOTE — Telephone Encounter (Signed)
Requested Prescriptions  Pending Prescriptions Disp Refills   pantoprazole (PROTONIX) 40 MG tablet [Pharmacy Med Name: PANTOPRAZOLE SOD DR 40 MG TAB] 90 tablet 0    Sig: TAKE 1 TABLET BY MOUTH EVERY DAY     Gastroenterology: Proton Pump Inhibitors Passed - 02/09/2023  2:31 AM      Passed - Valid encounter within last 12 months    Recent Outpatient Visits           2 months ago [place], PA-C   5 months ago Controlled type 2 diabetes mellitus with hyperglycemia, without long-term current use of insulin [facility])   [place], DO   11 months ago Essential hypertension   [place], DO   1 year ago Need for influenza vaccination   [facility] Kathrine Haddock, NP   1 year ago [facility] Bo Merino, FNP       Future Appointments             In 1 week Teodora Medici, [facility], [place]   In 2 weeks Rockey Situ, Kathlene November, MD [place] [facility]

## 2023-02-16 NOTE — Progress Notes (Signed)
Established Patient Office Visit  Subjective:  Patient ID: Brian Dean, male    DOB: 1956/08/01  Age: 67 y.o. MRN: [id number]  CC:  Chief Complaint  Patient presents with   Follow-up    HPI Brian Dean presents for follow up on chronic medical conditions. Overall doing well and reports no changes since LOV.  Hypertension: -Medications: Lisinopril 30 mg -Patient is compliant with above medications and reports no side effects. -Checking BP at home (average): has a cuff but not checking -Denies any SOB, CP, vision changes, LE edema or symptoms of hypotension -Follows with Cardiology, seeing again next week.  HLD/CAD: -Medications: Lipitor 20 mg -Patient is compliant with above medications and reports no side effects.  -Last lipid panel: Lipid Panel     Component Value Date/Time   CHOL 134 01/08/2022 1016   CHOL 129 10/31/2015 0924   TRIG 80 01/08/2022 1016   HDL 46 01/08/2022 1016   HDL 43 10/31/2015 0924   CHOLHDL 2.9 01/08/2022 1016   VLDL 19 04/27/2017 0917   LDLCALC 72 01/08/2022 1016   LABVLDL 14 10/31/2015 0924    Diabetes, Type 2 with polyneuropathy: -Last A1c 10/23 5.9 -Medications: Metformin 500 mg BID, Gabapentin 300 mg once daily  -Patient is compliant with the above medications and reports no side effects.  -Checking BG at home: doesn't check  -Eye exam: UTD 4/23 -Foot exam: UTD, follows podiatry last seen 08/03/22 -Microalbumin: 10/23, elevated  -Statin: yes -PNA vaccine: Due  -Denies symptoms of hypoglycemia, polyuria, polydipsia, numbness extremities, foot ulcers/trauma.   GERD: -Currently on Protonix 40 mg, symptoms stable  Lumbar Pain:  -Cannot take NSAIDs -Take Tramadol 50 mg rarely   Health Maintenance: -Blood work due -Colonoscopy 06/2019: repeat in 5 years -Discussed lung cancer screening, politely refuses  Past Medical History:  Diagnosis Date   Anemia    Arthritis    CAD (coronary artery disease) Nov 2014   diffuse,  aggresive risk factor modification recommended   Diabetes mellitus without complication    Diabetic neuropathy    Dyslipidemia    ED (erectile dysfunction)    GERD (gastroesophageal reflux disease)    History of acute pyelonephritis Aug. 2014   hospitalized; 100k E. coli pansensitive   Hypercholesteremia    Hyperlipidemia    Hypertension    Hypertensive heart disease    Inguinal hernia    Osteomyelitis    Osteomyelitis of left foot 01/03/2019   Sciatica    Syncope and collapse    Tobacco use    Unspecified systolic (congestive) heart failure    Wears dentures    partial upper and lower    Past Surgical History:  Procedure Laterality Date   AMPUTATION Left 01/04/2019   Procedure: AMPUTATION RAY LEFT GREAT TOE;  Surgeon: Linus Galas, DPM;  Location: [facility] ORS;  Service: Podiatry;  Laterality: Left;   CARDIAC CATHETERIZATION  11/14   [facility] x1   [facility]OS[facility]  [facility]   [facility]   COLONOSCOPY WITH PROPOFOL N/A 06/22/2016   Procedure: COLONOSCOPY WITH PROPOFOL;  Surgeon: Earline Mayotte, MD;  Location: [facility] ENDOSCOPY;  Service: Endoscopy;  Laterality: N/A;   COLONOSCOPY WITH PROPOFOL N/A 06/28/2019   Procedure: COLONOSCOPY WITH PROPOFOL;  Surgeon: Toney Reil, MD;  Location: [facility];  Service: Endoscopy;  Laterality: N/A;  Diabetic - oral meds   ELBOW ARTHROSCOPY  2001   lt   ESOPHAGOGASTRODUODENOSCOPY (EGD) WITH PROPOFOL N/A 06/28/2019   Procedure: ESOPHAGOGASTRODUODENOSCOPY (EGD) WITH PROPOFOL;  Surgeon: Lannette Donath  Betti Cruz, MD;  Location: [facility];  Service: Endoscopy;  Laterality: N/A;   GUM SURGERY     OLECRANON BURSECTOMY  11/12/2011   Procedure: OLECRANON BURSA;  Surgeon: Sharma Covert;  Location: [facility];  Service: Orthopedics;  Laterality: Right;  olecracnon bursectomy with delayed closure   POLYPECTOMY  06/28/2019   Procedure: POLYPECTOMY;  Surgeon: Toney Reil, MD;  Location: [facility];  Service: Endoscopy;;   WISDOM  TOOTH EXTRACTION      Family History  Problem Relation Age of Onset   Hypertension Father    Diabetes Father    Heart disease Father    Diabetes Mother    Heart disease Mother    Hypertension Mother    Cancer Mother        bladder   Diabetes Sister    Stroke Sister        mini stroke   Diabetes Brother    Diabetes Sister    Diabetes Sister    Diabetes Sister    Diabetes Sister     Social History   Socioeconomic History   Marital status: Married    Spouse name: Not on file   Number of children: Not on file   Years of education: Not on file   Highest education level: Not on file  Occupational History   Not on file  Tobacco Use   Smoking status: Every Day    Packs/day: 0.50    Years: 47.00    Additional pack years: 0.00    Total pack years: 23.50    Types: Cigarettes    Last attempt to quit: 11/09/2012    Years since quitting: 10.2   Smokeless tobacco: Never   Tobacco comments:    down to 5 cigs/day  Vaping Use   Vaping Use: Some days  Substance and Sexual Activity   Alcohol use: Yes    Alcohol/week: 0.0 standard drinks of alcohol    Comment: occasional (3-4x/yr)   Drug use: Yes   Sexual activity: Yes    Birth control/protection: None  Other Topics Concern   Not on file  Social History Narrative   Not on file   Social Determinants of Health   Financial Resource Strain: Not on file  Food Insecurity: Not on file  Transportation Needs: Not on file  Physical Activity: Not on file  Stress: Not on file  Social Connections: Not on file  Intimate Partner Violence: Not on file    Outpatient Medications Prior to Visit  Medication Sig Dispense Refill   aspirin 81 MG tablet Take 81 mg by mouth daily. Take 1 tablet (81 mg) by mouth once daily     atorvastatin (LIPITOR) 20 MG tablet TAKE 1 TABLET BY MOUTH EVERYDAY AT BEDTIME 90 tablet 3   CINNAMON PO Take 1,000 mg by mouth 2 (two) times daily. Reported on 05/06/2016     finasteride (PROSCAR) 5 MG tablet Take 1  tablet (5 mg total) by mouth daily. 90 tablet 3   gabapentin (NEURONTIN) 300 MG capsule TAKE 1 CAPSULE BY MOUTH EVERY DAY 90 capsule 0   lisinopril (ZESTRIL) 30 MG tablet Take 1 tablet (30 mg total) by mouth daily. 90 tablet 3   metFORMIN (GLUCOPHAGE) 500 MG tablet Take 1 tablet (500 mg total) by mouth 2 (two) times daily with a meal. 180 tablet 3   Multiple Vitamins-Minerals (PRESERVISION AREDS 2 PO) Take by mouth.     nitroGLYCERIN (NITROSTAT) 0.4 MG SL tablet PLACE 1 TABLET  UNDER THE TONGUE EVERY 5 MINUTES AS NEEDED. 25 tablet 0   pantoprazole (PROTONIX) 40 MG tablet TAKE 1 TABLET BY MOUTH EVERY DAY 90 tablet 0   pyridoxine (B-6) 100 MG tablet Take 100 mg by mouth daily.     tadalafil (CIALIS) 5 MG tablet Take 5 mg by mouth daily. Reported on 05/06/2016     traMADol (ULTRAM) 50 MG tablet Take 1 tablet (50 mg total) by mouth every 8 (eight) hours as needed for severe pain. #20 to last the year until 11/19/2021 20 tablet 0   No facility-administered medications prior to visit.    No Known Allergies  ROS Review of Systems  Constitutional:  Negative for chills and fever.  Eyes:  Negative for visual disturbance.  Respiratory:  Negative for cough and shortness of breath.   Cardiovascular:  Negative for chest pain.  Gastrointestinal:  Negative for abdominal pain.  Musculoskeletal:  Positive for back pain.  Neurological:  Negative for dizziness and headaches.      Objective:    Physical Exam Constitutional:      Appearance: Normal appearance.  HENT:     Head: Normocephalic and atraumatic.  Eyes:     Conjunctiva/sclera: Conjunctivae normal.  Cardiovascular:     Rate and Rhythm: Normal rate and regular rhythm.  Pulmonary:     Effort: Pulmonary effort is normal.     Breath sounds: Normal breath sounds.  Skin:    General: Skin is warm and dry.  Neurological:     General: No focal deficit present.     Mental Status: He is alert. Mental status is at baseline.  Psychiatric:         Mood and Affect: Mood normal.        Behavior: Behavior normal.     BP 108/60   Pulse 85   Temp 97.9 F (36.6 C)   Resp 18   Ht 6' (1.829 m)   Wt 160 lb 14.4 oz (73 kg)   SpO2 98%   BMI 21.82 kg/m  Wt Readings from Last 3 Encounters:  02/18/23 160 lb 14.4 oz (73 kg)  08/18/22 166 lb 11.2 oz (75.6 kg)  02/16/22 167 lb 11.2 oz (76.1 kg)     Health Maintenance Due  Topic Date Due   Medicare Annual Wellness (AWV)  Never done   Lung Cancer Screening  Never done   Diabetic kidney evaluation - eGFR measurement  01/09/2023   HEMOGLOBIN A1C  02/17/2023    There are no preventive care reminders to display for this patient.  No results found for: "TSH" Lab Results  Component Value Date   WBC 6.9 11/20/2020   HGB 14.0 11/20/2020   HCT 41.5 11/20/2020   MCV 93.3 11/20/2020   PLT 240 11/20/2020   Lab Results  Component Value Date   NA 138 01/08/2022   K 4.9 01/08/2022   CO2 27 01/08/2022   GLUCOSE 129 (H) 01/08/2022   BUN 17 01/08/2022   CREATININE 0.92 01/08/2022   BILITOT 0.7 01/08/2022   ALKPHOS 48 01/03/2019   AST 21 01/08/2022   ALT 23 01/08/2022   PROT 6.6 01/08/2022   ALBUMIN 3.7 01/03/2019   CALCIUM 9.2 01/08/2022   ANIONGAP 10 01/05/2019   EGFR 92 01/08/2022   Lab Results  Component Value Date   CHOL 134 01/08/2022   Lab Results  Component Value Date   HDL 46 01/08/2022   Lab Results  Component Value Date   LDLCALC 72 01/08/2022   Lab  Results  Component Value Date   TRIG 80 01/08/2022   Lab Results  Component Value Date   CHOLHDL 2.9 01/08/2022   Lab Results  Component Value Date   HGBA1C 5.9 (A) 08/18/2022      Assessment & Plan:   1. Diabetic polyneuropathy associated with type 2 diabetes mellitus: Check A1c today, has been controlled with Metformin 500 mg BID. Will refill Gabapentin 300 mg at bedtime for neuropathy.   - HgB A1c - gabapentin (NEURONTIN) 300 MG capsule; Take 1 capsule (300 mg total) by mouth daily.  Dispense: 90  capsule; Refill: 1  2. Hypertension, unspecified type: Stable, BP slightly low today. Patient denying symptoms and doing well. Continue Lisinopril 30 mg daily. Labs due.   - COMPLETE METABOLIC PANEL WITH GFR - CBC w/Diff/Platelet  3. Mixed hyperlipidemia: Check cholesterol today, continue Lipitor 20 mg.   - Lipid Profile  4. Gastroesophageal reflux disease, unspecified whether esophagitis present: Stable, doing well on Protonix 40 mg.  5. Erectile dysfunction, unspecified erectile dysfunction type: Referral to Urology placed.   - Ambulatory referral to Urology   Follow-up: Return in 6 months (on 08/20/2023).    Margarita Mail, DO

## 2023-02-17 ENCOUNTER — Ambulatory Visit: Payer: Medicare HMO | Admitting: Internal Medicine

## 2023-02-17 DIAGNOSIS — R234 Changes in skin texture: Secondary | ICD-10-CM | POA: Diagnosis not present

## 2023-02-17 DIAGNOSIS — E1140 Type 2 diabetes mellitus with diabetic neuropathy, unspecified: Secondary | ICD-10-CM | POA: Diagnosis not present

## 2023-02-17 DIAGNOSIS — L97521 Non-pressure chronic ulcer of other part of left foot limited to breakdown of skin: Secondary | ICD-10-CM | POA: Diagnosis not present

## 2023-02-18 ENCOUNTER — Ambulatory Visit (INDEPENDENT_AMBULATORY_CARE_PROVIDER_SITE_OTHER): Payer: Medicare HMO | Admitting: Internal Medicine

## 2023-02-18 ENCOUNTER — Encounter: Payer: Self-pay | Admitting: Internal Medicine

## 2023-02-18 VITALS — BP 108/60 | HR 85 | Temp 97.9°F | Resp 18 | Ht 72.0 in | Wt 160.9 lb

## 2023-02-18 DIAGNOSIS — N529 Male erectile dysfunction, unspecified: Secondary | ICD-10-CM

## 2023-02-18 DIAGNOSIS — I10 Essential (primary) hypertension: Secondary | ICD-10-CM

## 2023-02-18 DIAGNOSIS — E1142 Type 2 diabetes mellitus with diabetic polyneuropathy: Secondary | ICD-10-CM

## 2023-02-18 DIAGNOSIS — E782 Mixed hyperlipidemia: Secondary | ICD-10-CM | POA: Diagnosis not present

## 2023-02-18 DIAGNOSIS — K219 Gastro-esophageal reflux disease without esophagitis: Secondary | ICD-10-CM

## 2023-02-18 MED ORDER — GABAPENTIN 300 MG PO CAPS: 300.0000 mg | ORAL_CAPSULE | Freq: Every day | ORAL | 1 refills | Status: DC

## 2023-02-18 NOTE — Patient Instructions (Addendum)
It was great seeing you today!  Plan discussed at today's visit: -Blood work ordered today, results will be uploaded to MyChart.  -Medications refilled -Referral to Urology placed  Follow up in: 6 months  Take care and let us know if you have any questions or concerns prior to your next visit.  Dr. Caralee Ates

## 2023-02-19 LAB — COMPLETE METABOLIC PANEL WITH GFR
AG Ratio: 1.7 (calc) (ref 1.0–2.5)
ALT: 24 U/L (ref 9–46)
AST: 22 U/L (ref 10–35)
Albumin: 4.3 g/dL (ref 3.6–5.1)
Alkaline phosphatase (APISO): 57 U/L (ref 35–144)
BUN: 16 mg/dL (ref 7–25)
CO2: 27 mmol/L (ref 20–32)
Calcium: 9.4 mg/dL (ref 8.6–10.3)
Chloride: 103 mmol/L (ref 98–110)
Creat: 1.06 mg/dL (ref 0.70–1.35)
Globulin: 2.5 g/dL (calc) (ref 1.9–3.7)
Glucose, Bld: 135 mg/dL — ABNORMAL HIGH (ref 65–99)
Potassium: 4.7 mmol/L (ref 3.5–5.3)
Sodium: 137 mmol/L (ref 135–146)
Total Bilirubin: 0.8 mg/dL (ref 0.2–1.2)
Total Protein: 6.8 g/dL (ref 6.1–8.1)
eGFR: 77 mL/min/{1.73_m2} (ref >=60)

## 2023-02-19 LAB — LIPID PANEL
Cholesterol: 129 mg/dL (ref <200)
HDL: 45 mg/dL (ref >=40)
LDL Cholesterol (Calc): 60 mg/dL (calc)
Non-HDL Cholesterol (Calc): 84 mg/dL (calc) (ref <130)
Total CHOL/HDL Ratio: 2.9 (calc) (ref <5.0)
Triglycerides: 162 mg/dL — ABNORMAL HIGH (ref <150)

## 2023-02-19 LAB — CBC WITH DIFFERENTIAL/PLATELET
Absolute Monocytes: 448 cells/uL (ref 200–950)
Basophils Absolute: 51 cells/uL (ref 0–200)
Basophils Relative: 0.8 %
Eosinophils Absolute: 109 cells/uL (ref 15–500)
Eosinophils Relative: 1.7 %
HCT: 37.1 % — ABNORMAL LOW (ref 38.5–50.0)
Hemoglobin: 12.4 g/dL — ABNORMAL LOW (ref 13.2–17.1)
Lymphs Abs: 1568 cells/uL (ref 850–3900)
MCH: 30.9 pg (ref 27.0–33.0)
MCHC: 33.4 g/dL (ref 32.0–36.0)
MCV: 92.5 fL (ref 80.0–100.0)
MPV: 9.5 fL (ref 7.5–12.5)
Monocytes Relative: 7 %
Neutro Abs: 4224 cells/uL (ref 1500–7800)
Neutrophils Relative %: 66 %
Platelets: 243 10*3/uL (ref 140–400)
RBC: 4.01 10*6/uL — ABNORMAL LOW (ref 4.20–5.80)
RDW: 12.3 % (ref 11.0–15.0)
Total Lymphocyte: 24.5 %
WBC: 6.4 10*3/uL (ref 3.8–10.8)

## 2023-02-19 LAB — HEMOGLOBIN A1C
Hgb A1c MFr Bld: 6.2 % of total Hgb — ABNORMAL HIGH (ref <5.7)
Mean Plasma Glucose: 131 mg/dL
eAG (mmol/L): 7.3 mmol/L

## 2023-02-25 ENCOUNTER — Telehealth: Payer: Self-pay | Admitting: Internal Medicine

## 2023-02-25 NOTE — Telephone Encounter (Signed)
Contacted Loyed D Brayboy to schedule their annual wellness visit. Appointment made for 03/26/2023.  [facility] Care Guide [facility] AWV TEAM Direct Dial: [phone]

## 2023-02-28 NOTE — Progress Notes (Deleted)
cardiology Office Note  Date:  02/28/2023   ID:  Brian Dean, DOB 02-Jun-1956, MRN [id number]  PCP:  Margarita Mail, DO   Cc: CAD  HPI:  Brian Dean is a 67 y.o. retired, gentleman with a history of  Diabetes  Hypertension  Coronary Artery Disease, Coronary Artery Stent Placement,  proximal left circumflex, 09/25/2013 Long smoking history, 0.5 ppd, 20 pack years No longer vapes Gallstone Prostate issues Positive Stress test Who presents for follow up of of Coronary Artery Disease  Last seen by myself in clinic March 2023    BP elevated , did not take lisinopril today Feels well,  Retired,active Smoking 5-6 a day arthritis continues to be an issue  No chest pain/angina, Breathing stable No edema No regular exercise program but active  Lab work reviewed A1C 6.3 Total chol  134  EKG personally reviewed by myself on todays visit showed NSR rate 61 RBBB/LAFB   OTHER PAST MEDICAL HISTORY REVIEWED BY ME FOR TODAY'S VISIT: 2019 12/13/2017 EKG showed sinus bradycardia rate 49 bpm T wave abnormality lead III aVF Similar to previous EKG 2017  2014 09/25/2013 Stent placed to his proximal left circumflex 09/15/2013 Cardiac catheterization showed severe proximal left circumflex disease, 50% lesions in his LAD, 40% RCA disease. 07/26/2013 Previous Stress Myoview showed what appears to be a proximally occluded RCA with peri-infarct ischemia.  Left anterior descending and left circumflex territory appear intact with no perfusion abnormality. Ejection fraction 47% with wall motion abnormality in the inferior wall. 07/08/2013 - 07/09/2013 Hospitalized for acute pyelonephritis, acute on chronic low back pain, hyponatremia, diabetes, hypertension 07/07/2013 CT scan shows large calcified stone in the gallbladder neck, enlarged prostate He denied having any symptoms from his gallbladder stones    PMH:   has a past medical history of Anemia, Arthritis, CAD (coronary artery  disease) (Nov 2014), Diabetes mellitus without complication, Diabetic neuropathy, Dyslipidemia, ED (erectile dysfunction), GERD (gastroesophageal reflux disease), History of acute pyelonephritis (Aug. 2014), Hypercholesteremia, Hyperlipidemia, Hypertension, Hypertensive heart disease, Inguinal hernia, Osteomyelitis, Osteomyelitis of left foot (01/03/2019), Sciatica, Syncope and collapse, Tobacco use, Unspecified systolic (congestive) heart failure, and Wears dentures.  PSH:    Past Surgical History:  Procedure Laterality Date   AMPUTATION Left 01/04/2019   Procedure: AMPUTATION RAY LEFT GREAT TOE;  Surgeon: Linus Galas, DPM;  Location: [facility] ORS;  Service: Podiatry;  Laterality: Left;   CARDIAC CATHETERIZATION  11/14   [facility] x1   [facility]  [facility]   [facility]   COLONOSCOPY WITH PROPOFOL N/A 06/22/2016   Procedure: COLONOSCOPY WITH PROPOFOL;  Surgeon: Earline Mayotte, MD;  Location: [facility] ENDOSCOPY;  Service: Endoscopy;  Laterality: N/A;   COLONOSCOPY WITH PROPOFOL N/A 06/28/2019   Procedure: COLONOSCOPY WITH PROPOFOL;  Surgeon: Toney Reil, MD;  Location: [facility];  Service: Endoscopy;  Laterality: N/A;  Diabetic - oral meds   ELBOW ARTHROSCOPY  2001   lt   ESOPHAGOGASTRODUODENOSCOPY (EGD) WITH PROPOFOL N/A 06/28/2019   Procedure: ESOPHAGOGASTRODUODENOSCOPY (EGD) WITH PROPOFOL;  Surgeon: Toney Reil, MD;  Location: [facility];  Service: Endoscopy;  Laterality: N/A;   GUM SURGERY     OLECRANON BURSECTOMY  11/12/2011   Procedure: OLECRANON BURSA;  Surgeon: Sharma Covert;  Location: [facility];  Service: Orthopedics;  Laterality: Right;  olecracnon bursectomy with delayed closure   POLYPECTOMY  06/28/2019   Procedure: POLYPECTOMY;  Surgeon: Toney Reil, MD;  Location: [facility];  Service: Endoscopy;;   WISDOM TOOTH EXTRACTION  Current Outpatient Medications  Medication Sig Dispense Refill   aspirin 81 MG tablet Take 81 mg by mouth  daily. Take 1 tablet (81 mg) by mouth once daily     atorvastatin (LIPITOR) 20 MG tablet TAKE 1 TABLET BY MOUTH EVERYDAY AT BEDTIME 90 tablet 3   CINNAMON PO Take 1,000 mg by mouth 2 (two) times daily. Reported on 05/06/2016     finasteride (PROSCAR) 5 MG tablet Take 1 tablet (5 mg total) by mouth daily. 90 tablet 3   gabapentin (NEURONTIN) 300 MG capsule Take 1 capsule (300 mg total) by mouth daily. 90 capsule 1   lisinopril (ZESTRIL) 30 MG tablet Take 1 tablet (30 mg total) by mouth daily. 90 tablet 3   metFORMIN (GLUCOPHAGE) 500 MG tablet Take 1 tablet (500 mg total) by mouth 2 (two) times daily with a meal. 180 tablet 3   Multiple Vitamins-Minerals (PRESERVISION AREDS 2 PO) Take by mouth.     nitroGLYCERIN (NITROSTAT) 0.4 MG SL tablet PLACE 1 TABLET UNDER THE TONGUE EVERY 5 MINUTES AS NEEDED. 25 tablet 0   pantoprazole (PROTONIX) 40 MG tablet TAKE 1 TABLET BY MOUTH EVERY DAY 90 tablet 0   pyridoxine (B-6) 100 MG tablet Take 100 mg by mouth daily.     tadalafil (CIALIS) 5 MG tablet Take 5 mg by mouth daily. Reported on 05/06/2016     traMADol (ULTRAM) 50 MG tablet Take 1 tablet (50 mg total) by mouth every 8 (eight) hours as needed for severe pain. #20 to last the year until 11/19/2021 20 tablet 0   No current facility-administered medications for this visit.    Allergies:   Patient has no known allergies.   Social History:  The patient  reports that he has been smoking cigarettes. He has a 23.50 pack-year smoking history. He has never used smokeless tobacco. He reports current alcohol use. He reports that he does not currently use drugs.   Family History:   family history includes Cancer in his mother; Diabetes in his brother, father, mother, sister, sister, sister, sister, and sister; Heart disease in his father and mother; Hypertension in his father and mother; Stroke in his sister.   Review of Systems: Review of Systems  Constitutional: Negative.   Respiratory: Negative.     Cardiovascular: Negative.   Gastrointestinal: Negative.   Musculoskeletal: Negative.   Neurological: Negative.   Psychiatric/Behavioral: Negative.    All other systems reviewed and are negative.   PHYSICAL EXAM: VS:  There were no vitals taken for this visit. , BMI There is no height or weight on file to calculate BMI.  Constitutional:  oriented to person, place, and time. No distress.  HENT:  Head: Grossly normal Eyes:  no discharge. No scleral icterus.  Neck: No JVD, no carotid bruits  Cardiovascular: Regular rate and rhythm, no murmurs appreciated Pulmonary/Chest: Clear to auscultation bilaterally, no wheezes or rails Abdominal: Soft.  no distension.  no tenderness.  Musculoskeletal: Normal range of motion Neurological:  normal muscle tone. Coordination normal. No atrophy Skin: Skin warm and dry Psychiatric: normal affect, pleasant  Recent Labs: 02/18/2023: ALT 24; BUN 16; Creat 1.06; Hemoglobin 12.4; Platelets 243; Potassium 4.7; Sodium 137    Lipid Panel Lab Results  Component Value Date   CHOL 129 02/18/2023   HDL 45 02/18/2023   LDLCALC 60 02/18/2023   TRIG 162 (H) 02/18/2023     WEIGHT LOSS: Wt Readings from Last 3 Encounters:  02/18/23 160 lb 14.4 oz (73 kg)  08/18/22  166 lb 11.2 oz (75.6 kg)  02/16/22 167 lb 11.2 oz (76.1 kg)     ASSESSMENT AND PLAN:  Hypertensive heart disease without heart failure Did not take medcs today Requested home numbers, suggested he call us  Coronary artery disease involving native coronary artery of native heart without angina pectoris Currently with no symptoms of angina. No further workup at this time. Continue current medication regimen. Still smoking  Mixed hyperlipidemia Cholesterol is at goal on the current lipid regimen. No changes to the medications were made.  Type 2 diabetes mellitus without complication, without long-term current use of insulin (HCC) Plan: A1C 6.3, stable We have encouraged continued  exercise, careful diet management in an effort to lose weight.  Smoker Still smoking Does not want chantix Discussed again  S/P coronary artery stent placement No further testing  Total encounter time more than 30 minutes Greater than 50% was spent in counseling and coordination of care with the patient      Signed, Dossie Arbour, M.D., Ph.D. 02/28/2023  [facility] [place], [place] [phone]

## 2023-03-01 ENCOUNTER — Ambulatory Visit: Payer: Medicare HMO | Admitting: Cardiovascular Disease

## 2023-03-01 DIAGNOSIS — E119 Type 2 diabetes mellitus without complications: Secondary | ICD-10-CM

## 2023-03-01 DIAGNOSIS — E782 Mixed hyperlipidemia: Secondary | ICD-10-CM

## 2023-03-01 DIAGNOSIS — I25118 Atherosclerotic heart disease of native coronary artery with other forms of angina pectoris: Secondary | ICD-10-CM

## 2023-03-01 DIAGNOSIS — I119 Hypertensive heart disease without heart failure: Secondary | ICD-10-CM

## 2023-03-01 DIAGNOSIS — J449 Chronic obstructive pulmonary disease, unspecified: Secondary | ICD-10-CM

## 2023-03-01 DIAGNOSIS — F17200 Nicotine dependence, unspecified, uncomplicated: Secondary | ICD-10-CM

## 2023-03-12 DIAGNOSIS — H353131 Nonexudative age-related macular degeneration, bilateral, early dry stage: Secondary | ICD-10-CM | POA: Diagnosis not present

## 2023-03-12 DIAGNOSIS — H2513 Age-related nuclear cataract, bilateral: Secondary | ICD-10-CM | POA: Diagnosis not present

## 2023-03-12 LAB — HM DIABETES EYE EXAM

## 2023-03-22 NOTE — Progress Notes (Unsigned)
cardiology Office Note  Date:  03/24/2023   ID:  Brian Dean, DOB Nov 18, 1955, MRN [id number]  PCP:  Margarita Mail, DO   Chief Complaint  Patient presents with   12 month follow up     "Doing well." Medications reviewed by the patient verbally.     HPI:  Brian Dean is a 67 y.o. retired, gentleman with a history of  Diabetes  Hypertension  Coronary Artery Disease, Coronary Artery Stent Placement,  proximal left circumflex, 09/25/2013 Long smoking history, 0.5 ppd, 20 pack years Smokes 5-6 per day Gallstone Prostate issues Positive Stress test Who presents for follow up of of Coronary Artery Disease  Last seen by myself in clinic March 2023 Follow-up today reports he feels well with no complaints Denies chest pain concerning for angina  Pulled 4 teeth last week Difficulty tolerating NSAIDs, upsets his stomach  Labs reviewed Total chol 124, LDL 60 A1C 6.2  Smoking 5-6 a day  Denies change in breathing Retired, no regular exercise program No lower extremity edema, no PND orthopnea  EKG personally reviewed by myself on todays visit showed NSR rate 71 RBBB/LAFB, no change from prior study   OTHER PAST MEDICAL HISTORY REVIEWED BY ME FOR TODAY'S VISIT: 2019 12/13/2017 EKG showed sinus bradycardia rate 49 bpm T wave abnormality lead III aVF Similar to previous EKG 2017  2014 09/25/2013 Stent placed to his proximal left circumflex 09/15/2013 Cardiac catheterization showed severe proximal left circumflex disease, 50% lesions in his LAD, 40% RCA disease. 07/26/2013 Previous Stress Myoview showed what appears to be a proximally occluded RCA with peri-infarct ischemia.  Left anterior descending and left circumflex territory appear intact with no perfusion abnormality. Ejection fraction 47% with wall motion abnormality in the inferior wall. 07/08/2013 - 07/09/2013 Hospitalized for acute pyelonephritis, acute on chronic low back pain, hyponatremia, diabetes,  hypertension 07/07/2013 CT scan shows large calcified stone in the gallbladder neck, enlarged prostate He denied having any symptoms from his gallbladder stones    PMH:   has a past medical history of Anemia, Arthritis, CAD (coronary artery disease) (Nov 2014), Diabetes mellitus without complication (HCC), Diabetic neuropathy (HCC), Dyslipidemia, ED (erectile dysfunction), GERD (gastroesophageal reflux disease), History of acute pyelonephritis (Aug. 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  PSH:    Past Surgical History:  Procedure Laterality Date   AMPUTATION Left 01/04/2019   Procedure: AMPUTATION RAY LEFT GREAT TOE;  Surgeon: Linus Galas, DPM;  Location: [facility];  Service: Podiatry;  Laterality: Left;   CARDIAC CATHETERIZATION  11/14   [facility] x1   [facility]ONOSCOPY  [place]   [facility]   COLONOSCOPY WITH PROPOFOL N/A 06/22/2016   Procedure: COLONOSCOPY WITH PROPOFOL;  Surgeon: Earline Mayotte, MD;  Location: [facility];  Service: Endoscopy;  Laterality: N/A;   COLONOSCOPY WITH PROPOFOL N/A 06/28/2019   Procedure: COLONOSCOPY WITH PROPOFOL;  Surgeon: Toney Reil, MD;  Location: [facility];  Service: Endoscopy;  Laterality: N/A;  Diabetic - oral meds   ELBOW ARTHROSCOPY  2001   lt   ESOPHAGOGASTRODUODENOSCOPY (EGD) WITH PROPOFOL N/A 06/28/2019   Procedure: ESOPHAGOGASTRODUODENOSCOPY (EGD) WITH PROPOFOL;  Surgeon: Toney Reil, MD;  Location: [facility];  Service: Endoscopy;  Laterality: N/A;   GUM SURGERY     OLECRANON BURSECTOMY  11/12/2011   Procedure: OLECRANON BURSA;  Surgeon: Sharma Covert;  Location: [facility]  [place];  Service: Orthopedics;  Laterality: Right;  olecracnon bursectomy with delayed closure    POLYPECTOMY  06/28/2019   Procedure: POLYPECTOMY;  Surgeon: Toney Reil, MD;  Location: [facility];  Service: Endoscopy;;   WISDOM TOOTH EXTRACTION      Current Outpatient Medications  Medication Sig Dispense Refill   aspirin 81 MG tablet Take 81 mg by mouth daily. Take 1 tablet (81 mg) by mouth once daily     atorvastatin (LIPITOR) 20 MG tablet TAKE 1 TABLET BY MOUTH EVERYDAY AT BEDTIME 90 tablet 3   CINNAMON PO Take 1,000 mg by mouth 2 (two) times daily. Reported on 05/06/2016     finasteride (PROSCAR) 5 MG tablet Take 1 tablet (5 mg total) by mouth daily. 90 tablet 3   gabapentin (NEURONTIN) 300 MG capsule Take 1 capsule (300 mg total) by mouth daily. 90 capsule 1   lisinopril (ZESTRIL) 30 MG tablet Take 1 tablet (30 mg total) by mouth daily. 90 tablet 3   metFORMIN (GLUCOPHAGE) 500 MG tablet Take 1 tablet (500 mg total) by mouth 2 (two) times daily with a meal. 180 tablet 3   Multiple Vitamins-Minerals (PRESERVISION AREDS 2 PO) Take by mouth.     nitroGLYCERIN (NITROSTAT) 0.4 MG SL tablet PLACE 1 TABLET UNDER THE TONGUE EVERY 5 MINUTES AS NEEDED. 25 tablet 0   pantoprazole (PROTONIX) 40 MG tablet TAKE 1 TABLET BY MOUTH EVERY DAY 90 tablet 0   pyridoxine (B-6) 100 MG tablet Take 100 mg by mouth daily.     tadalafil (CIALIS) 5 MG tablet Take 5 mg by mouth daily. Reported on 05/06/2016     traMADol (ULTRAM) 50 MG tablet Take 1 tablet (50 mg total) by mouth every 8 (eight) hours as needed for severe pain. #20 to last the year until 11/19/2021 20 tablet 0   No current facility-administered medications for this visit.    Allergies:   Patient has no known allergies.   Social History:  The patient  reports that he has been smoking cigarettes. He has a 23.50 pack-year smoking history. He has never used smokeless tobacco. He reports current alcohol use. He reports that he does not currently use drugs.   Family History:   family history includes Cancer in his mother; Diabetes in his  brother, father, mother, sister, sister, sister, sister, and sister; Heart disease in his father and mother; Hypertension in his father and mother; Stroke in his sister.   Review of Systems: Review of Systems  Constitutional: Negative.   Respiratory: Negative.    Cardiovascular: Negative.   Gastrointestinal: Negative.   Musculoskeletal: Negative.   Neurological: Negative.   Psychiatric/Behavioral: Negative.    All other systems reviewed and are negative.   PHYSICAL EXAM: VS:  BP (!) 140/70 (BP Location: Left Arm, Patient Position: Sitting, Cuff Size: Normal)   Pulse 71   Ht 6' (1.829 m)   Wt 159 lb (72.1 kg)   SpO2 95%   BMI 21.56 kg/m  , BMI Body mass index is 21.56 kg/m. Constitutional:  oriented to person, place, and time. No distress.  HENT:  Head: Grossly normal Eyes:  no discharge. No scleral icterus.  Neck: No JVD, no carotid bruits  Cardiovascular: Regular rate and rhythm, no murmurs appreciated Pulmonary/Chest: Clear to auscultation bilaterally, no wheezes or rails Abdominal: Soft.  no distension.  no tenderness.  Musculoskeletal: Normal range of motion Neurological:  normal muscle tone. Coordination normal. No atrophy Skin: Skin warm and dry Psychiatric: normal  affect, pleasant  Recent Labs: 02/18/2023: ALT 24; BUN 16; Creat 1.06; Hemoglobin 12.4; Platelets 243; Potassium 4.7; Sodium 137    Lipid Panel Lab Results  Component Value Date   CHOL 129 02/18/2023   HDL 45 02/18/2023   LDLCALC 60 02/18/2023   TRIG 162 (H) 02/18/2023     WEIGHT LOSS: Wt Readings from Last 3 Encounters:  03/24/23 159 lb (72.1 kg)  02/18/23 160 lb 14.4 oz (73 kg)  08/18/22 166 lb 11.2 oz (75.6 kg)     ASSESSMENT AND PLAN:  Hypertensive heart disease without heart failure Blood pressure is well controlled on today's visit. No changes made to the medications.  Coronary artery disease involving native coronary artery of native heart without angina pectoris Currently with no  symptoms of angina. No further workup at this time. Continue current medication regimen. Still smoking, cessation recommended  Mixed hyperlipidemia Cholesterol is at goal on the current lipid regimen. No changes to the medications were made.  Type 2 diabetes mellitus without complication, without long-term current use of insulin (HCC) A1C 6.2, stable Weight stable,controlled  Smoker Still smoking Does not want chantix  S/P coronary artery stent placement No further testing  Total encounter time more than 30 minutes Greater than 50% was spent in counseling and coordination of care with the patient      Signed, Dossie Arbour, M.D., Ph.D. 03/24/2023  [facility] [place], [place] [phone]

## 2023-03-24 ENCOUNTER — Encounter: Payer: Self-pay | Admitting: Cardiovascular Disease

## 2023-03-24 ENCOUNTER — Ambulatory Visit: Payer: Medicare HMO | Attending: Cardiovascular Disease | Admitting: Cardiovascular Disease

## 2023-03-24 VITALS — BP 140/70 | HR 71 | Ht 72.0 in | Wt 159.0 lb

## 2023-03-24 DIAGNOSIS — E782 Mixed hyperlipidemia: Secondary | ICD-10-CM | POA: Diagnosis not present

## 2023-03-24 DIAGNOSIS — E1165 Type 2 diabetes mellitus with hyperglycemia: Secondary | ICD-10-CM

## 2023-03-24 DIAGNOSIS — F17200 Nicotine dependence, unspecified, uncomplicated: Secondary | ICD-10-CM

## 2023-03-24 DIAGNOSIS — I25118 Atherosclerotic heart disease of native coronary artery with other forms of angina pectoris: Secondary | ICD-10-CM | POA: Diagnosis not present

## 2023-03-24 DIAGNOSIS — Z7984 Long term (current) use of oral hypoglycemic drugs: Secondary | ICD-10-CM | POA: Diagnosis not present

## 2023-03-24 DIAGNOSIS — I119 Hypertensive heart disease without heart failure: Secondary | ICD-10-CM

## 2023-03-24 DIAGNOSIS — E119 Type 2 diabetes mellitus without complications: Secondary | ICD-10-CM | POA: Diagnosis not present

## 2023-03-24 MED ORDER — NITROGLYCERIN 0.4 MG SL SUBL: SUBLINGUAL_TABLET | SUBLINGUAL | 0 refills | Status: DC

## 2023-03-24 NOTE — Patient Instructions (Signed)
Medication Instructions:  No changes  If you need a refill on your cardiac medications before your next appointment, please call your pharmacy.   Lab work: No new labs needed  Testing/Procedures: No new testing needed  Follow-Up: At [facility], you and your health needs are our priority.  As part of our continuing mission to provide you with exceptional heart care, we have created designated Provider Care Teams.  These Care Teams include your primary Cardiologist (physician) and Advanced Practice Providers (APPs -  Physician Assistants and Nurse Practitioners) who all work together to provide you with the care you need, when you need it.  You will need a follow up appointment in 12 months  Providers on your designated Care Team:   Christopher Berge, NP Ryan Dunn, PA-C Cadence Furth, PA-C  COVID-19 Vaccine Information can be found at: [email or web address] For questions related to vaccine distribution or appointments, please email [email or web address] or call [phone].   

## 2023-03-26 ENCOUNTER — Ambulatory Visit (INDEPENDENT_AMBULATORY_CARE_PROVIDER_SITE_OTHER): Payer: Medicare HMO

## 2023-03-26 VITALS — Ht 72.0 in | Wt 159.0 lb

## 2023-03-26 DIAGNOSIS — Z0000 Encounter for general adult medical examination without abnormal findings: Secondary | ICD-10-CM

## 2023-03-26 NOTE — Progress Notes (Signed)
I connected with  Moishy D Mark on 03/26/23 by a audio enabled telemedicine application and verified that I am speaking with the correct person using two identifiers.  Patient Location: Home  Provider Location: Office/Clinic  I discussed the limitations of evaluation and management by telemedicine. The patient expressed understanding and agreed to proceed.  Subjective:   Brian Dean is a 67 y.o. male who presents for an Initial Medicare Annual Wellness Visit.  Review of Systems    Cardiac Risk Factors include: advanced age (>76men, >50 women);diabetes mellitus;dyslipidemia;hypertension;male gender;smoking/ tobacco exposure     Objective:    Today's Vitals   03/26/23 1330  Weight: 159 lb (72.1 kg)  Height: 6' (1.829 m)   Body mass index is 21.56 kg/m.     03/26/2023    1:38 PM 06/28/2019    7:40 AM 01/03/2019    5:13 PM 01/03/2019    1:50 PM 04/27/2017    8:40 AM 10/27/2016    9:16 AM 06/22/2016    8:42 AM  Advanced Directives  Does Patient Have a Medical Advance Directive? No No No No No No No  Would patient like information on creating a medical advance directive?  No - Patient declined No - Patient declined No - Patient declined       Current Medications (verified) Outpatient Encounter Medications as of 03/26/2023  Medication Sig   aspirin 81 MG tablet Take 81 mg by mouth daily. Take 1 tablet (81 mg) by mouth once daily   atorvastatin (LIPITOR) 20 MG tablet TAKE 1 TABLET BY MOUTH EVERYDAY AT BEDTIME   CINNAMON PO Take 1,000 mg by mouth 2 (two) times daily. Reported on 05/06/2016   finasteride (PROSCAR) 5 MG tablet Take 1 tablet (5 mg total) by mouth daily.   gabapentin (NEURONTIN) 300 MG capsule Take 1 capsule (300 mg total) by mouth daily.   lisinopril (ZESTRIL) 30 MG tablet Take 1 tablet (30 mg total) by mouth daily.   metFORMIN (GLUCOPHAGE) 500 MG tablet Take 1 tablet (500 mg total) by mouth 2 (two) times daily with a meal.   Multiple Vitamins-Minerals  (PRESERVISION AREDS 2 PO) Take by mouth.   nitroGLYCERIN (NITROSTAT) 0.4 MG SL tablet PLACE 1 TABLET UNDER THE TONGUE EVERY 5 MINUTES AS NEEDED.   pantoprazole (PROTONIX) 40 MG tablet TAKE 1 TABLET BY MOUTH EVERY DAY   pyridoxine (B-6) 100 MG tablet Take 100 mg by mouth daily.   tadalafil (CIALIS) 5 MG tablet Take 5 mg by mouth daily. Reported on 05/06/2016   traMADol (ULTRAM) 50 MG tablet Take 1 tablet (50 mg total) by mouth every 8 (eight) hours as needed for severe pain. #20 to last the year until 11/19/2021   No facility-administered encounter medications on file as of 03/26/2023.    Allergies (verified) Patient has no known allergies.   History: Past Medical History:  Diagnosis Date   Anemia    Arthritis    CAD (coronary artery disease) Nov 2014   diffuse, aggresive risk factor modification recommended   Diabetes mellitus without complication (HCC)    Diabetic neuropathy (HCC)    Dyslipidemia    ED (erectile dysfunction)    GERD (gastroesophageal reflux disease)    History of acute pyelonephritis Aug. 2014   hospitalized; 100k E. coli pansensitive   Hypercholesteremia    Hyperlipidemia    Hypertension    Hypertensive heart disease    Inguinal hernia    Osteomyelitis (HCC)    Osteomyelitis of left foot (HCC) 01/03/2019  Sciatica    Syncope and collapse    Tobacco use    Unspecified systolic (congestive) heart failure (HCC)    Wears dentures    partial upper and lower   Past Surgical History:  Procedure Laterality Date   AMPUTATION Left 01/04/2019   Procedure: AMPUTATION RAY LEFT GREAT TOE;  Surgeon: Linus Galas, DPM;  Location: [facility];  Service: Podiatry;  Laterality: Left;   CARDIAC CATHETERIZATION  11/14   [facility] x1   COLONOSCOPY  2004   [facility]   COLONOSCOPY WITH PROPOFOL N/A 06/22/2016   Procedure: COLONOSCOPY WITH PROPOFOL;  Surgeon: Earline Mayotte, MD;  Location: [facility] END[facility];  Service: Endoscopy;  Laterality: N/A;   COLONOSCOPY WITH PROPOFOL N/A 06/28/2019    Procedure: COLONOSCOPY WITH PROPOFOL;  Surgeon: Toney Reil, MD;  Location: [facility];  Service: Endoscopy;  Laterality: N/A;  Diabetic - oral meds   ELBOW ARTHROSCOPY  2001   lt   ESOPHAGOGASTRODUODENOSCOPY (EGD) WITH PROPOFOL N/A 06/28/2019   Procedure: ESOPHAGOGASTRODUODENOSCOPY (EGD) WITH PROPOFOL;  Surgeon: Toney Reil, MD;  Location: [facility];  Service: Endoscopy;  Laterality: N/A;   GUM SURGERY     OLECRANON BURSECTOMY  11/12/2011   Procedure: OLECRANON BURSA;  Surgeon: Sharma Covert;  Location: [facility];  Service: Orthopedics;  Laterality: Right;  olecracnon bursectomy with delayed closure   POLYPECTOMY  06/28/2019   Procedure: POLYPECTOMY;  Surgeon: Toney Reil, MD;  Location: [facility];  Service: Endoscopy;;   WISDOM TOOTH EXTRACTION     Family History  Problem Relation Age of Onset   Hypertension Father    Diabetes Father    Heart disease Father    Diabetes Mother    Heart disease Mother    Hypertension Mother    Cancer Mother        bladder   Diabetes Sister    Stroke Sister        mini stroke   Diabetes Brother    Diabetes Sister    Diabetes Sister    Diabetes Sister    Diabetes Sister    Social History   Socioeconomic History   Marital status: Married    Spouse name: Not on file   Number of children: Not on file   Years of education: Not on file   Highest education level: Not on file  Occupational History   Not on file  Tobacco Use   Smoking status: Every Day    Packs/day: 0.50    Years: 47.00    Additional pack years: 0.00    Total pack years: 23.50    Types: Cigarettes    Last attempt to quit: 11/09/2012    Years since quitting: 10.3   Smokeless tobacco: Never   Tobacco comments:    down to 5 cigs/day  Vaping Use   Vaping Use: Never used  Substance and Sexual Activity   Alcohol use: Yes    Alcohol/week: 0.0 standard drinks of alcohol    Comment: occasional (3-4x/yr)   Drug  use: Not Currently   Sexual activity: Yes    Birth control/protection: None  Other Topics Concern   Not on file  Social History Narrative   Not on file   Social Determinants of Health   Financial Resource Strain: Low Risk  (03/26/2023)   Overall Financial Resource Strain (CARDIA)    Difficulty of Paying Living Expenses: Not hard at all  Food Insecurity: No Food Insecurity (03/26/2023)   Hunger Vital Sign  Worried About Programme researcher, broadcasting/film/video in the Last Year: Never true    Ran Out of Food in the Last Year: Never true  Transportation Needs: No Transportation Needs (03/26/2023)   PRAPARE - Administrator, Civil Service (Medical): No    Lack of Transportation (Non-Medical): No  Physical Activity: Sufficiently Active (03/26/2023)   Exercise Vital Sign    Days of Exercise per Week: 7 days    Minutes of Exercise per Session: 30 min  Stress: No Stress Concern Present (03/26/2023)   [employer] of Occupational Health - Occupational Stress Questionnaire    Feeling of Stress : Not at all  Social Connections: Moderately Isolated (03/26/2023)   Social Connection and Isolation Panel [NHANES]    Frequency of Communication with Friends and Family: More than three times a week    Frequency of Social Gatherings with Friends and Family: Once a week    Attends Religious Services: Never    Database administrator or Organizations: No    Attends Engineer, structural: Never    Marital Status: Married    Tobacco Counseling Ready to quit: Not Answered Counseling given: Not Answered Tobacco comments: down to 5 cigs/day   Clinical Intake:  Pre-visit preparation completed: Yes  Pain : No/denies pain   BMI - recorded: 21.56 Nutritional Status: BMI of 19-24  Normal Nutritional Risks: None Diabetes: Yes CBG done?: No Did pt. bring in CBG monitor from home?: No  How often do you need to have someone help you when you read instructions, pamphlets, or other written materials  from your doctor or pharmacy?: 1 - Never  Diabetic?yes  Interpreter Needed?: No  Comments: lives with wife Information entered by :: B.Jovee Dettinger,LPN   Activities of Daily Living    03/26/2023    1:38 PM 02/18/2023    1:10 PM  In your present state of health, do you have any difficulty performing the following activities:  Hearing? 1 1  Vision? 0 0  Difficulty concentrating or making decisions? 0 0  Walking or climbing stairs? 0 0  Dressing or bathing? 0 0  Doing errands, shopping? 0 0  Preparing Food and eating ? N   Using the Toilet? N   In the past six months, have you accidently leaked urine? N   Do you have problems with loss of bowel control? N   Managing your Medications? N   Managing your Finances? N   Housekeeping or managing your Housekeeping? N     Patient Care Team: Margarita Mail, DO as PCP - General (Internal Medicine) Lemar Livings, Merrily Pew, MD (General Surgery) Sherie Don Janit Bern, MD as Attending Physician (Family Medicine) Antonieta Iba, MD as Consulting Physician (Cardiology)  Indicate any recent Medical Services you may have received from other than [facility] providers in the past year (date may be approximate).     Assessment:   This is a routine wellness examination for Jaimen.  Hearing/Vision screen Hearing Screening - Comments:: Adequate hearing;little less hearing in rt ear;tinnitus Vision Screening - Comments:: Adequate vision w/glasses Dr Brooke Dare  Dietary issues and exercise activities discussed: Current Exercise Habits: Home exercise routine, Type of exercise: walking (works in [employer] everyday), Time (Minutes): 30, Frequency (Times/Week): 7, Weekly Exercise (Minutes/Week): 210, Intensity: Mild, Exercise limited by: neurologic condition(s);cardiac condition(s)   Goals Addressed   None    Depression Screen    03/26/2023    1:36 PM 02/18/2023    1:10 PM 12/08/2022   10:38 AM 08/18/2022  11:08 AM 11/18/2021    9:25 AM 07/24/2021   11:54 AM  05/20/2021    9:12 AM  PHQ 2/9 Scores  PHQ - 2 Score 0 0 0 0 0 0 0  PHQ- 9 Score  0 0 0 0  0    Fall Risk    03/26/2023    1:34 PM 02/18/2023    1:08 PM 12/08/2022   10:38 AM 08/18/2022   11:08 AM 11/18/2021    9:25 AM  Fall Risk   Falls in the past year? 0 0 0 0 0  Number falls in past yr: 0 0 0 0 0  Injury with Fall? 0 0 0 0 0  Risk for fall due to : No Fall Risks  No Fall Risks  No Fall Risks  Follow up Education provided;Falls prevention discussed  Falls prevention discussed;Education provided;Falls evaluation completed  Falls prevention discussed    FALL RISK PREVENTION PERTAINING TO THE HOME:  Any stairs in or around the home? Yes  If so, are there any without handrails? Yes  Home free of loose throw rugs in walkways, pet beds, electrical cords, etc? Yes  Adequate lighting in your home to reduce risk of falls? Yes   ASSISTIVE DEVICES UTILIZED TO PREVENT FALLS:  Life alert? No  Use of a cane, walker or w/c? No  Grab bars in the bathroom? No  Shower chair or bench in shower? No  Elevated toilet seat or a handicapped toilet? No    Cognitive Function:        03/26/2023    1:46 PM  6CIT Screen  What Year? 0 points  What month? 0 points  What time? 0 points  Count back from 20 0 points  Months in reverse 0 points  Repeat phrase 0 points  Total Score 0 points    Immunizations Immunization History  Administered Date(s) Administered   Fluad Quad(high Dose 65+) 11/18/2021, 08/18/2022   Influenza Inj Mdck Quad Pf 11/20/2019   Influenza,inj,Quad PF,6+ Mos 10/25/2015, 10/27/2017, 10/28/2018   PNEUMOCOCCAL CONJUGATE-20 08/18/2022   Pneumococcal Polysaccharide-23 09/30/2010   Td 09/30/2010   Tdap 01/03/2019    TDAP status: Up to date  Flu Vaccine status: Up to date  Pneumococcal vaccine status: Up to date  Covid-19 vaccine status: Completed vaccines  Qualifies for Shingles Vaccine? Yes   Zostavax completed No   Shingrix Completed?: No.    Education has  been provided regarding the importance of this vaccine. Patient has been advised to call insurance company to determine out of pocket expense if they have not yet received this vaccine. Advised may also receive vaccine at local pharmacy or Health Dept. Verbalized acceptance and understanding.  Screening Tests Health Maintenance  Topic Date Due   Lung Cancer Screening  Never done   Zoster Vaccines- Shingrix (1 of 2) 05/20/2023 (Originally 09/03/1975)   INFLUENZA VACCINE  06/10/2023   Diabetic kidney evaluation - Urine ACR  08/19/2023   FOOT EXAM  08/19/2023   HEMOGLOBIN A1C  08/20/2023   Diabetic kidney evaluation - eGFR measurement  02/18/2024   OPHTHALMOLOGY EXAM  03/11/2024   Medicare Annual Wellness (AWV)  03/25/2024   COLONOSCOPY (Pts 45-45yrs Insurance coverage will need to be confirmed)  06/27/2024   DTaP/Tdap/Td (3 - Td or Tdap) 01/03/2029   Pneumonia Vaccine 53+ Years old  Completed   Hepatitis C Screening  Completed   HPV VACCINES  Aged Out   COVID-19 Vaccine  Discontinued    Health Maintenance  Health Maintenance  Due  Topic Date Due   Lung Cancer Screening  Never done    Colorectal cancer screening: Type of screening: Colonoscopy. Completed yes. Repeat every 5 years  Lung Cancer Screening: (Low Dose CT Chest recommended if Age 31-80 years, 30 pack-year currently smoking OR have quit w/in 15years.) does qualify.   Lung Cancer Screening Referral: no pt does not desire  Additional Screening:  Hepatitis C Screening: does not qualify; Completed yes  Vision Screening: Recommended annual ophthalmology exams for early detection of glaucoma and other disorders of the eye. Is the patient up to date with their annual eye exam?  Yes  Who is the provider or what is the name of the office in which the patient attends annual eye exams? Dr Brooke Dare If pt is not established with a provider, would they like to be referred to a provider to establish care? No .   Dental Screening:  Recommended annual dental exams for proper oral hygiene  Community Resource Referral / Chronic Care Management: CRR required this visit?  No   CCM required this visit?  No      Plan:     I have personally reviewed and noted the following in the patient's chart:   Medical and social history Use of alcohol, tobacco or illicit drugs  Current medications and supplements including opioid prescriptions. Patient is not currently taking opioid prescriptions. Functional ability and status Nutritional status Physical activity Advanced directives List of other physicians Hospitalizations, surgeries, and ER visits in previous 12 months Vitals Screenings to include cognitive, depression, and falls Referrals and appointments  In addition, I have reviewed and discussed with patient certain preventive protocols, quality metrics, and best practice recommendations. A written personalized care plan for preventive services as well as general preventive health recommendations were provided to patient.     Sue Lush, LPN   1/61/0960   Nurse Notes: The patient states he is doing well and has no concerns or questions at this time.

## 2023-03-26 NOTE — Patient Instructions (Signed)
[name] , Thank you for taking time to come for your Medicare Wellness Visit. I appreciate your ongoing commitment to your health goals. Please review the following plan we discussed and let me know if I can assist you in the future.   These are the goals we discussed:  Goals   None     This is a list of the screening recommended for you and due dates:  Health Maintenance  Topic Date Due   Screening for Lung Cancer  Never done   Zoster (Shingles) Vaccine (1 of 2) 05/20/2023*   Flu Shot  06/10/2023   Yearly kidney health urinalysis for diabetes  08/19/2023   Complete foot exam   08/19/2023   Hemoglobin A1C  08/20/2023   Yearly kidney function blood test for diabetes  02/18/2024   Eye exam for diabetics  03/11/2024   Medicare Annual Wellness Visit  03/25/2024   Colon Cancer Screening  06/27/2024   DTaP/Tdap/Td vaccine (3 - Td or Tdap) 01/03/2029   Pneumonia Vaccine  Completed   Hepatitis C Screening: USPSTF Recommendation to screen - Ages 67-79 yo.  Completed   HPV Vaccine  Aged Out   COVID-19 Vaccine  Discontinued  *Topic was postponed. The date shown is not the original due date.    Advanced directives: no  Conditions/risks identified: none  Next appointment: Follow up in one year for your annual wellness visit. 03/31/2024 @ 1:30pm telephone  Preventive Care 67 Years and Older, Male  Preventive care refers to lifestyle choices and visits with your health care provider that can promote health and wellness. What does preventive care include? A yearly physical exam. This is also called an annual well check. Dental exams once or twice a year. Routine eye exams. Ask your health care provider how often you should have your eyes checked. Personal lifestyle choices, including: Daily care of your teeth and gums. Regular physical activity. Eating a healthy diet. Avoiding tobacco and drug use. Limiting alcohol use. Practicing safe sex. Taking low doses of aspirin every  day. Taking vitamin and mineral supplements as recommended by your health care provider. What happens during an annual well check? The services and screenings done by your health care provider during your annual well check will depend on your age, overall health, lifestyle risk factors, and family history of disease. Counseling  Your health care provider may ask you questions about your: Alcohol use. Tobacco use. Drug use. Emotional well-being. Home and relationship well-being. Sexual activity. Eating habits. History of falls. Memory and ability to understand (cognition). Work and work Astronomer. Screening  You may have the following tests or measurements: Height, weight, and BMI. Blood pressure. Lipid and cholesterol levels. These may be checked every 5 years, or more frequently if you are over 50 years old. Skin check. Lung cancer screening. You may have this screening every year starting at age 67 if you have a 30-pack-year history of smoking and currently smoke or have quit within the past 15 years. Fecal occult blood test (FOBT) of the stool. You may have this test every year starting at age 48. Flexible sigmoidoscopy or colonoscopy. You may have a sigmoidoscopy every 5 years or a colonoscopy every 10 years starting at age 67. Prostate cancer screening. Recommendations will vary depending on your family history and other risks. Hepatitis C blood test. Hepatitis B blood test. Sexually transmitted disease (STD) testing. Diabetes screening. This is done by checking your blood sugar (glucose) after you have not eaten for a while (  fasting). You may have this done every 1-3 years. Abdominal aortic aneurysm (AAA) screening. You may need this if you are a current or former smoker. Osteoporosis. You may be screened starting at age 67 if you are at high risk. Talk with your health care provider about your test results, treatment options, and if necessary, the need for more  tests. Vaccines  Your health care provider may recommend certain vaccines, such as: Influenza vaccine. This is recommended every year. Tetanus, diphtheria, and acellular pertussis (Tdap, Td) vaccine. You may need a Td booster every 10 years. Zoster vaccine. You may need this after age 17. Pneumococcal 13-valent conjugate (PCV13) vaccine. One dose is recommended after age 29. Pneumococcal polysaccharide (PPSV23) vaccine. One dose is recommended after age 5. Talk to your health care provider about which screenings and vaccines you need and how often you need them. This information is not intended to replace advice given to you by your health care provider. Make sure you discuss any questions you have with your health care provider. Document Released: 11/22/2015 Document Revised: 07/15/2016 Document Reviewed: 08/27/2015 Elsevier Interactive Patient Education  2017 [employer].  Fall Prevention in the Home Falls can cause injuries. They can happen to people of all ages. There are many things you can do to make your home safe and to help prevent falls. What can I do on the outside of my home? Regularly fix the edges of walkways and driveways and fix any cracks. Remove anything that might make you trip as you walk through a door, such as a raised step or threshold. Trim any bushes or trees on the path to your home. Use bright outdoor lighting. Clear any walking paths of anything that might make someone trip, such as rocks or tools. Regularly check to see if handrails are loose or broken. Make sure that both sides of any steps have handrails. Any raised decks and porches should have guardrails on the edges. Have any leaves, snow, or ice cleared regularly. Use sand or salt on walking paths during winter. Clean up any spills in your garage right away. This includes oil or grease spills. What can I do in the bathroom? Use night lights. Install grab bars by the toilet and in the tub and shower.  Do not use towel bars as grab bars. Use non-skid mats or decals in the tub or shower. If you need to sit down in the shower, use a plastic, non-slip stool. Keep the floor dry. Clean up any water that spills on the floor as soon as it happens. Remove soap buildup in the tub or shower regularly. Attach bath mats securely with double-sided non-slip rug tape. Do not have throw rugs and other things on the floor that can make you trip. What can I do in the bedroom? Use night lights. Make sure that you have a light by your bed that is easy to reach. Do not use any sheets or blankets that are too big for your bed. They should not hang down onto the floor. Have a firm chair that has side arms. You can use this for support while you get dressed. Do not have throw rugs and other things on the floor that can make you trip. What can I do in the kitchen? Clean up any spills right away. Avoid walking on wet floors. Keep items that you use a lot in easy-to-reach places. If you need to reach something above you, use a strong step stool that has a grab bar.  Keep electrical cords out of the way. Do not use floor polish or wax that makes floors slippery. If you must use wax, use non-skid floor wax. Do not have throw rugs and other things on the floor that can make you trip. What can I do with my stairs? Do not leave any items on the stairs. Make sure that there are handrails on both sides of the stairs and use them. Fix handrails that are broken or loose. Make sure that handrails are as long as the stairways. Check any carpeting to make sure that it is firmly attached to the stairs. Fix any carpet that is loose or worn. Avoid having throw rugs at the top or bottom of the stairs. If you do have throw rugs, attach them to the floor with carpet tape. Make sure that you have a light switch at the top of the stairs and the bottom of the stairs. If you do not have them, ask someone to add them for you. What else  can I do to help prevent falls? Wear shoes that: Do not have high heels. Have rubber bottoms. Are comfortable and fit you well. Are closed at the toe. Do not wear sandals. If you use a stepladder: Make sure that it is fully opened. Do not climb a closed stepladder. Make sure that both sides of the stepladder are locked into place. Ask someone to hold it for you, if possible. Clearly mark and make sure that you can see: Any grab bars or handrails. First and last steps. Where the edge of each step is. Use tools that help you move around (mobility aids) if they are needed. These include: Canes. Walkers. Scooters. Crutches. Turn on the lights when you go into a dark area. Replace any light bulbs as soon as they burn out. Set up your furniture so you have a clear path. Avoid moving your furniture around. If any of your floors are uneven, fix them. If there are any pets around you, be aware of where they are. Review your medicines with your doctor. Some medicines can make you feel dizzy. This can increase your chance of falling. Ask your doctor what other things that you can do to help prevent falls. This information is not intended to replace advice given to you by your health care provider. Make sure you discuss any questions you have with your health care provider. Document Released: 08/22/2009 Document Revised: 04/02/2016 Document Reviewed: 11/30/2014 Elsevier Interactive Patient Education  2017 [employer].

## 2023-03-31 DIAGNOSIS — L851 Acquired keratosis [keratoderma] palmaris et plantaris: Secondary | ICD-10-CM | POA: Diagnosis not present

## 2023-03-31 DIAGNOSIS — E1140 Type 2 diabetes mellitus with diabetic neuropathy, unspecified: Secondary | ICD-10-CM | POA: Diagnosis not present

## 2023-03-31 DIAGNOSIS — B351 Tinea unguium: Secondary | ICD-10-CM | POA: Diagnosis not present

## 2023-05-06 ENCOUNTER — Other Ambulatory Visit: Payer: Self-pay | Admitting: Internal Medicine

## 2023-05-06 DIAGNOSIS — K219 Gastro-esophageal reflux disease without esophagitis: Secondary | ICD-10-CM

## 2023-05-07 NOTE — Telephone Encounter (Signed)
Requested Prescriptions  Pending Prescriptions Disp Refills   pantoprazole (PROTONIX) 40 MG tablet [Pharmacy Med Name: PANTOPRAZOLE SOD DR 40 MG TAB] 90 tablet 2    Sig: TAKE 1 TABLET BY MOUTH EVERY DAY     Gastroenterology: Proton Pump Inhibitors Passed - 05/06/2023  2:41 AM      Passed - Valid encounter within last 12 months    Recent Outpatient Visits           2 months ago Diabetic polyneuropathy associated with type 2 diabetes mellitus [facility])   [facility] Margarita Mail, DO   5 months ago COVID-19   [facility] Mecum, Oswaldo Conroy, PA-C   8 months ago Controlled type 2 diabetes mellitus with hyperglycemia, without long-term current use of insulin [facility])    [facility] Margarita Mail, DO   1 year ago Essential hypertension   [facility] Margarita Mail, DO   1 year ago Need for influenza vaccination   [facility] Gabriel Cirri, NP       Future Appointments             In 3 months Margarita Mail, DO [facility], [facility]

## 2023-05-31 DIAGNOSIS — E1140 Type 2 diabetes mellitus with diabetic neuropathy, unspecified: Secondary | ICD-10-CM | POA: Diagnosis not present

## 2023-05-31 DIAGNOSIS — L97521 Non-pressure chronic ulcer of other part of left foot limited to breakdown of skin: Secondary | ICD-10-CM | POA: Diagnosis not present

## 2023-07-13 DIAGNOSIS — B351 Tinea unguium: Secondary | ICD-10-CM | POA: Diagnosis not present

## 2023-07-13 DIAGNOSIS — L851 Acquired keratosis [keratoderma] palmaris et plantaris: Secondary | ICD-10-CM | POA: Diagnosis not present

## 2023-07-13 DIAGNOSIS — L97521 Non-pressure chronic ulcer of other part of left foot limited to breakdown of skin: Secondary | ICD-10-CM | POA: Diagnosis not present

## 2023-07-13 DIAGNOSIS — E1140 Type 2 diabetes mellitus with diabetic neuropathy, unspecified: Secondary | ICD-10-CM | POA: Diagnosis not present

## 2023-08-19 NOTE — Progress Notes (Signed)
Established Patient Office Visit  Subjective:  Patient ID: Brian Dean, male    DOB: 22-Feb-1956  Age: 67 y.o. MRN: [id number]  CC:  Chief Complaint  Patient presents with   Follow-up   Diabetes    HPI DERAY DAWES presents for follow up on chronic medical conditions. Overall doing well, had to have several teeth pulled in May but otherwise no changes.   Hypertension: -Medications: Lisinopril 30 mg -Patient is compliant with above medications and reports no side effects. -Checking BP at home (average): has a cuff but not checking -Denies any SOB, CP, vision changes, LE edema or symptoms of hypotension -Follows with Cardiology  HLD/CAD: -Medications: Lipitor 20 mg -Patient is compliant with above medications and reports no side effects.  -Last lipid panel: Lipid Panel     Component Value Date/Time   CHOL 129 02/18/2023 1337   CHOL 129 10/31/2015 0924   TRIG 162 (H) 02/18/2023 1337   HDL 45 02/18/2023 1337   HDL 43 10/31/2015 0924   CHOLHDL 2.9 02/18/2023 1337   VLDL 19 04/27/2017 0917   LDLCALC 60 02/18/2023 1337   LABVLDL 14 10/31/2015 0924    Diabetes, Type 2 with polyneuropathy: -Last A1c 6.2% 4/24 -Medications: Metformin 500 mg BID, Gabapentin 300 mg once daily  -Patient is compliant with the above medications and reports no side effects.  -Checking BG at home: doesn't check  -Eye exam: UTD  -Foot exam: Due -Microalbumin: Due -Statin: yes -PNA vaccine: UTD -Denies symptoms of hypoglycemia, polyuria, polydipsia, numbness extremities, foot ulcers/trauma.   GERD: -Currently on Protonix 40 mg, symptoms stable  Lumbar Pain:  -Cannot take NSAIDs -Takes Tramadol 50 mg rarely   Health Maintenance: -Blood work UTD -Colonoscopy 06/2019: repeat in 5 years -Discussed lung cancer screening, politely declines  Past Medical History:  Diagnosis Date   Anemia    Arthritis    CAD (coronary artery disease) Nov 2014   diffuse, aggresive risk factor  modification recommended   Diabetes mellitus without complication (HCC)    Diabetic neuropathy (HCC)    Dyslipidemia    ED (erectile dysfunction)    GERD (gastroesophageal reflux disease)    History of acute pyelonephritis Aug. 2014   hospitalized; 100k E. coli pansensitive   Hypercholesteremia    Hyperlipidemia    Hypertension    Hypertensive heart disease    Inguinal hernia    Osteomyelitis (HCC)    Osteomyelitis of left foot (HCC) 01/03/2019   Sciatica    Syncope and collapse    Tobacco use    Unspecified systolic (congestive) heart failure (HCC)    Wears dentures    partial upper and lower    Past Surgical History:  Procedure Laterality Date   AMPUTATION Left 01/04/2019   Procedure: AMPUTATION RAY LEFT GREAT TOE;  Surgeon: Linus Galas, DPM;  Location: [facility] ORS;  Service: Podiatry;  Laterality: Left;   CARDIAC CATHETERIZATION  11/14   [facility] x1   COLONOSCOPY  2004   [facility]   COLONOSCOPY WITH PROPOFOL N/A 06/22/2016   Procedure: COLONOSCOPY WITH PROPOFOL;  Surgeon: Earline Mayotte, MD;  Location: [facility] END[facility]COPY;  Service: Endoscopy;  Laterality: N/A;   COLONOSCOPY WITH PROPOFOL N/A 06/28/2019   Procedure: COLONOSCOPY WITH PROPOFOL;  Surgeon: Toney Reil, MD;  Location: [facility];  Service: Endoscopy;  Laterality: N/A;  Diabetic - oral meds   ELBOW ARTHROSCOPY  2001   lt   ESOPHAGOGASTRODUODENOSCOPY (EGD) WITH PROPOFOL N/A 06/28/2019   Procedure: ESOPHAGOGASTRODUODENOSCOPY (EGD) WITH PROPOFOL;  Surgeon: Toney Reil, MD;  Location: [facility];  Service: Endoscopy;  Laterality: N/A;   GUM SURGERY     OLECRANON BURSECTOMY  11/12/2011   Procedure: OLECRANON BURSA;  Surgeon: Sharma Covert;  Location: [facility];  Service: Orthopedics;  Laterality: Right;  olecracnon bursectomy with delayed closure   POLYPECTOMY  06/28/2019   Procedure: POLYPECTOMY;  Surgeon: Toney Reil, MD;  Location: [facility];  Service: Endoscopy;;    WISDOM TOOTH EXTRACTION      Family History  Problem Relation Age of Onset   Hypertension Father    Diabetes Father    Heart disease Father    Diabetes Mother    Heart disease Mother    Hypertension Mother    Cancer Mother        bladder   Diabetes Sister    Stroke Sister        mini stroke   Diabetes Brother    Diabetes Sister    Diabetes Sister    Diabetes Sister    Diabetes Sister     Social History   Socioeconomic History   Marital status: Married    Spouse name: Not on file   Number of children: Not on file   Years of education: Not on file   Highest education level: Not on file  Occupational History   Not on file  Tobacco Use   Smoking status: Every Day    Current packs/day: 0.00    Average packs/day: 0.5 packs/day for 47.0 years (23.5 ttl pk-yrs)    Types: Cigarettes    Start date: 11/09/1965    Last attempt to quit: 11/09/2012    Years since quitting: 10.7   Smokeless tobacco: Never   Tobacco comments:    down to 5 cigs/day  Vaping Use   Vaping status: Never Used  Substance and Sexual Activity   Alcohol use: Yes    Alcohol/week: 0.0 standard drinks of alcohol    Comment: occasional (3-4x/yr)   Drug use: Not Currently   Sexual activity: Yes    Birth control/protection: None  Other Topics Concern   Not on file  Social History Narrative   Not on file   Social Determinants of Health   Financial Resource Strain: Low Risk  (03/26/2023)   Overall Financial Resource Strain (CARDIA)    Difficulty of Paying Living Expenses: Not hard at all  Food Insecurity: No Food Insecurity (03/26/2023)   Hunger Vital Sign    Worried About Running Out of Food in the Last Year: Never true    Ran Out of Food in the Last Year: Never true  Transportation Needs: No Transportation Needs (03/26/2023)   PRAPARE - Administrator, Civil Service (Medical): No    Lack of Transportation (Non-Medical): No  Physical Activity: Sufficiently Active (03/26/2023)   Exercise  Vital Sign    Days of Exercise per Week: 7 days    Minutes of Exercise per Session: 30 min  Stress: No Stress Concern Present (03/26/2023)   [employer] of Occupational Health - Occupational Stress Questionnaire    Feeling of Stress : Not at all  Social Connections: Moderately Isolated (03/26/2023)   Social Connection and Isolation Panel [NHANES]    Frequency of Communication with Friends and Family: More than three times a week    Frequency of Social Gatherings with Friends and Family: Once a week    Attends Religious Services: Never    Database administrator or Organizations:  No    Attends Club or Organization Meetings: Never    Marital Status: Married  Catering manager Violence: Not At Risk (03/26/2023)   Humiliation, Afraid, Rape, and Kick questionnaire    Fear of Current or Ex-Partner: No    Emotionally Abused: No    Physically Abused: No    Sexually Abused: No    Outpatient Medications Prior to Visit  Medication Sig Dispense Refill   aspirin 81 MG tablet Take 81 mg by mouth daily. Take 1 tablet (81 mg) by mouth once daily     atorvastatin (LIPITOR) 20 MG tablet TAKE 1 TABLET BY MOUTH EVERYDAY AT BEDTIME 90 tablet 3   CINNAMON PO Take 1,000 mg by mouth 2 (two) times daily. Reported on 05/06/2016     finasteride (PROSCAR) 5 MG tablet Take 1 tablet (5 mg total) by mouth daily. 90 tablet 3   gabapentin (NEURONTIN) 300 MG capsule Take 1 capsule (300 mg total) by mouth daily. 90 capsule 1   lisinopril (ZESTRIL) 30 MG tablet Take 1 tablet (30 mg total) by mouth daily. 90 tablet 3   metFORMIN (GLUCOPHAGE) 500 MG tablet Take 1 tablet (500 mg total) by mouth 2 (two) times daily with a meal. 180 tablet 3   Multiple Vitamins-Minerals (PRESERVISION AREDS 2 PO) Take by mouth.     nitroGLYCERIN (NITROSTAT) 0.4 MG SL tablet PLACE 1 TABLET UNDER THE TONGUE EVERY 5 MINUTES AS NEEDED. 25 tablet 0   pantoprazole (PROTONIX) 40 MG tablet TAKE 1 TABLET BY MOUTH EVERY DAY 90 tablet 2   pyridoxine  (B-6) 100 MG tablet Take 100 mg by mouth daily.     tadalafil (CIALIS) 5 MG tablet Take 5 mg by mouth daily. Reported on 05/06/2016     traMADol (ULTRAM) 50 MG tablet Take 1 tablet (50 mg total) by mouth every 8 (eight) hours as needed for severe pain. #20 to last the year until 11/19/2021 20 tablet 0   No facility-administered medications prior to visit.    No Known Allergies  ROS Review of Systems  Constitutional:  Negative for chills and fever.  Eyes:  Negative for visual disturbance.  Respiratory:  Negative for cough and shortness of breath.   Cardiovascular:  Negative for chest pain.  Gastrointestinal:  Negative for abdominal pain.  Musculoskeletal:  Positive for back pain.  Neurological:  Negative for dizziness and headaches.      Objective:    Physical Exam Constitutional:      Appearance: Normal appearance.  HENT:     Head: Normocephalic and atraumatic.     Mouth/Throat:     Mouth: Mucous membranes are moist.     Pharynx: Oropharynx is clear.  Eyes:     Extraocular Movements: Extraocular movements intact.     Conjunctiva/sclera: Conjunctivae normal.     Pupils: Pupils are equal, round, and reactive to light.  Neck:     Vascular: No carotid bruit.  Cardiovascular:     Rate and Rhythm: Normal rate and regular rhythm.  Pulmonary:     Effort: Pulmonary effort is normal.     Breath sounds: Normal breath sounds.  Skin:    General: Skin is warm and dry.  Neurological:     General: No focal deficit present.     Mental Status: He is alert. Mental status is at baseline.  Psychiatric:        Mood and Affect: Mood normal.        Behavior: Behavior normal.     BP 116/62  Pulse 74   Temp 98.1 F (36.7 C)   Resp 16   Ht 6' (1.829 m)   Wt 156 lb 12.8 oz (71.1 kg)   SpO2 96%   BMI 21.27 kg/m  Wt Readings from Last 3 Encounters:  08/20/23 156 lb 12.8 oz (71.1 kg)  03/26/23 159 lb (72.1 kg)  03/24/23 159 lb (72.1 kg)     Health Maintenance Due  Topic Date  Due   Zoster Vaccines- Shingrix (1 of 2) Never done   Lung Cancer Screening  Never done   INFLUENZA VACCINE  06/10/2023   Diabetic kidney evaluation - Urine ACR  08/19/2023   HEMOGLOBIN A1C  08/20/2023    There are no preventive care reminders to display for this patient.  No results found for: "TSH" Lab Results  Component Value Date   WBC 6.4 02/18/2023   HGB 12.4 (L) 02/18/2023   HCT 37.1 (L) 02/18/2023   MCV 92.5 02/18/2023   PLT 243 02/18/2023   Lab Results  Component Value Date   NA 137 02/18/2023   K 4.7 02/18/2023   CO2 27 02/18/2023   GLUCOSE 135 (H) 02/18/2023   BUN 16 02/18/2023   CREATININE 1.06 02/18/2023   BILITOT 0.8 02/18/2023   ALKPHOS 48 01/03/2019   AST 22 02/18/2023   ALT 24 02/18/2023   PROT 6.8 02/18/2023   ALBUMIN 3.7 01/03/2019   CALCIUM 9.4 02/18/2023   ANIONGAP 10 01/05/2019   EGFR 77 02/18/2023   Lab Results  Component Value Date   CHOL 129 02/18/2023   Lab Results  Component Value Date   HDL 45 02/18/2023   Lab Results  Component Value Date   LDLCALC 60 02/18/2023   Lab Results  Component Value Date   TRIG 162 (H) 02/18/2023   Lab Results  Component Value Date   CHOLHDL 2.9 02/18/2023   Lab Results  Component Value Date   HGBA1C 6.2 (H) 02/18/2023      Assessment & Plan:   1. Controlled type 2 diabetes mellitus with diabetic polyneuropathy, without long-term current use of insulin (HCC): A1c stable today at 6.0.  Obtain urine microalbumin.  Continue metformin and gabapentin, both refilled.  Follow-up in 6 months.  - POCT HgB A1C - Urine Microalbumin w/creat. ratio - gabapentin (NEURONTIN) 300 MG capsule; Take 1 capsule (300 mg total) by mouth daily.  Dispense: 90 capsule; Refill: 1 - metFORMIN (GLUCOPHAGE) 500 MG tablet; Take 1 tablet (500 mg total) by mouth 2 (two) times daily with a meal.  Dispense: 180 tablet; Refill: 1  2. Hypertension, unspecified type: Blood pressure stable here today, no changes made to  medications and appropriate refills sent to pharmacy.   - lisinopril (ZESTRIL) 30 MG tablet; Take 1 tablet (30 mg total) by mouth daily.  Dispense: 90 tablet; Refill: 1  3. Mixed hyperlipidemia: Check fasting labs at follow-up.  Refill statin.  - atorvastatin (LIPITOR) 20 MG tablet; TAKE 1 TABLET BY MOUTH EVERYDAY AT BEDTIME  Dispense: 90 tablet; Refill: 1  4. Chronic right-sided low back pain with right-sided sciatica: Patient cannot take anti-inflammatories and uses tramadol 50 mg very rarely as needed for pain.  20 pills sent to pharmacy today.  - traMADol (ULTRAM) 50 MG tablet; Take 1 tablet (50 mg total) by mouth every 8 (eight) hours as needed for severe pain. #20 to last the year until 11/19/2021  Dispense: 20 tablet; Refill: 0  5. Benign prostatic hyperplasia with lower urinary tract symptoms, symptom details unspecified: Symptoms stable  refill finasteride.  - finasteride (PROSCAR) 5 MG tablet; Take 1 tablet (5 mg total) by mouth daily.  Dispense: 90 tablet; Refill: 1  6. Erectile dysfunction, unspecified erectile dysfunction type: Refill sent to pharmacy.  - tadalafil (CIALIS) 5 MG tablet; Take 1 tablet (5 mg total) by mouth daily. Reported on 05/06/2016  Dispense: 30 tablet; Refill: 0  7. Need for influenza vaccination: Flu vaccine administered today.   - Flu Vaccine Trivalent High Dose (Fluad)   Follow-up: Return in about 6 months (around 02/18/2024).    Margarita Mail, DO

## 2023-08-20 ENCOUNTER — Encounter: Payer: Self-pay | Admitting: Internal Medicine

## 2023-08-20 ENCOUNTER — Ambulatory Visit: Payer: Medicare HMO | Admitting: Internal Medicine

## 2023-08-20 VITALS — BP 116/62 | HR 74 | Temp 98.1°F | Resp 16 | Ht 72.0 in | Wt 156.8 lb

## 2023-08-20 DIAGNOSIS — I10 Essential (primary) hypertension: Secondary | ICD-10-CM

## 2023-08-20 DIAGNOSIS — E1142 Type 2 diabetes mellitus with diabetic polyneuropathy: Secondary | ICD-10-CM | POA: Diagnosis not present

## 2023-08-20 DIAGNOSIS — G8929 Other chronic pain: Secondary | ICD-10-CM | POA: Diagnosis not present

## 2023-08-20 DIAGNOSIS — M5441 Lumbago with sciatica, right side: Secondary | ICD-10-CM | POA: Diagnosis not present

## 2023-08-20 DIAGNOSIS — Z23 Encounter for immunization: Secondary | ICD-10-CM | POA: Diagnosis not present

## 2023-08-20 DIAGNOSIS — N529 Male erectile dysfunction, unspecified: Secondary | ICD-10-CM

## 2023-08-20 DIAGNOSIS — N401 Enlarged prostate with lower urinary tract symptoms: Secondary | ICD-10-CM

## 2023-08-20 DIAGNOSIS — E782 Mixed hyperlipidemia: Secondary | ICD-10-CM

## 2023-08-20 DIAGNOSIS — E1165 Type 2 diabetes mellitus with hyperglycemia: Secondary | ICD-10-CM | POA: Diagnosis not present

## 2023-08-20 LAB — POCT GLYCOSYLATED HEMOGLOBIN (HGB A1C): Hemoglobin A1C: 6 % — AB (ref 4.0–5.6)

## 2023-08-20 MED ORDER — TADALAFIL 5 MG PO TABS
5.0000 mg | ORAL_TABLET | Freq: Every day | ORAL | 0 refills | Status: DC
Start: 2023-08-20 — End: 2024-08-18

## 2023-08-20 MED ORDER — LISINOPRIL 30 MG PO TABS
30.0000 mg | ORAL_TABLET | Freq: Every day | ORAL | 1 refills | Status: DC
Start: 2023-08-20 — End: 2024-02-18

## 2023-08-20 MED ORDER — TRAMADOL HCL 50 MG PO TABS: 50.0000 mg | ORAL_TABLET | Freq: Three times a day (TID) | ORAL | 0 refills | Status: DC | PRN

## 2023-08-20 MED ORDER — ATORVASTATIN CALCIUM 20 MG PO TABS
ORAL_TABLET | ORAL | 1 refills | Status: DC
Start: 2023-08-20 — End: 2024-02-18

## 2023-08-20 MED ORDER — FINASTERIDE 5 MG PO TABS
5.0000 mg | ORAL_TABLET | Freq: Every day | ORAL | 1 refills | Status: DC
Start: 2023-08-20 — End: 2024-02-18

## 2023-08-20 MED ORDER — GABAPENTIN 300 MG PO CAPS
300.0000 mg | ORAL_CAPSULE | Freq: Every day | ORAL | 1 refills | Status: DC
Start: 2023-08-20 — End: 2024-01-27

## 2023-08-20 MED ORDER — METFORMIN HCL 500 MG PO TABS
500.0000 mg | ORAL_TABLET | Freq: Two times a day (BID) | ORAL | 1 refills | Status: DC
Start: 2023-08-20 — End: 2024-02-18

## 2023-08-21 LAB — MICROALBUMIN / CREATININE URINE RATIO
Creatinine, Urine: 100 mg/dL (ref 20–320)
Microalb Creat Ratio: 33 mg/g{creat} — ABNORMAL HIGH (ref <30)
Microalb, Ur: 3.3 mg/dL

## 2023-08-24 DIAGNOSIS — L97521 Non-pressure chronic ulcer of other part of left foot limited to breakdown of skin: Secondary | ICD-10-CM | POA: Diagnosis not present

## 2023-08-24 DIAGNOSIS — E1140 Type 2 diabetes mellitus with diabetic neuropathy, unspecified: Secondary | ICD-10-CM | POA: Diagnosis not present

## 2023-10-05 DIAGNOSIS — B351 Tinea unguium: Secondary | ICD-10-CM | POA: Diagnosis not present

## 2023-10-05 DIAGNOSIS — L851 Acquired keratosis [keratoderma] palmaris et plantaris: Secondary | ICD-10-CM | POA: Diagnosis not present

## 2023-10-05 DIAGNOSIS — E1140 Type 2 diabetes mellitus with diabetic neuropathy, unspecified: Secondary | ICD-10-CM | POA: Diagnosis not present

## 2023-11-16 DIAGNOSIS — L97521 Non-pressure chronic ulcer of other part of left foot limited to breakdown of skin: Secondary | ICD-10-CM | POA: Diagnosis not present

## 2023-11-16 DIAGNOSIS — R234 Changes in skin texture: Secondary | ICD-10-CM | POA: Diagnosis not present

## 2023-11-16 DIAGNOSIS — E1140 Type 2 diabetes mellitus with diabetic neuropathy, unspecified: Secondary | ICD-10-CM | POA: Diagnosis not present

## 2023-12-03 DIAGNOSIS — E119 Type 2 diabetes mellitus without complications: Secondary | ICD-10-CM | POA: Diagnosis not present

## 2023-12-03 DIAGNOSIS — H353131 Nonexudative age-related macular degeneration, bilateral, early dry stage: Secondary | ICD-10-CM | POA: Diagnosis not present

## 2023-12-03 DIAGNOSIS — H25043 Posterior subcapsular polar age-related cataract, bilateral: Secondary | ICD-10-CM | POA: Diagnosis not present

## 2023-12-03 DIAGNOSIS — H2513 Age-related nuclear cataract, bilateral: Secondary | ICD-10-CM | POA: Diagnosis not present

## 2023-12-03 LAB — HM DIABETES EYE EXAM

## 2023-12-06 DIAGNOSIS — H903 Sensorineural hearing loss, bilateral: Secondary | ICD-10-CM | POA: Diagnosis not present

## 2023-12-06 DIAGNOSIS — H6123 Impacted cerumen, bilateral: Secondary | ICD-10-CM | POA: Diagnosis not present

## 2023-12-06 DIAGNOSIS — H9313 Tinnitus, bilateral: Secondary | ICD-10-CM | POA: Diagnosis not present

## 2023-12-21 ENCOUNTER — Encounter: Payer: Self-pay | Admitting: Ophthalmology

## 2023-12-21 DIAGNOSIS — H2512 Age-related nuclear cataract, left eye: Secondary | ICD-10-CM | POA: Diagnosis not present

## 2023-12-21 NOTE — Anesthesia Preprocedure Evaluation (Addendum)
 Anesthesia Evaluation  Patient identified by MRN, date of birth, ID band Patient awake    Reviewed: Allergy & Precautions, H&P , NPO status , Patient's Chart, lab work & pertinent test results  Airway Mallampati: III  TM Distance: >3 FB Neck ROM: Full    Dental no notable dental hx. (+) Upper Dentures, Lower Dentures   Pulmonary neg pulmonary ROS, COPD, Current Smoker and Patient abstained from smoking.  Slightly coarse breath sounds bilaterally   + decreased breath sounds      Cardiovascular hypertension, + angina  + CAD and +CHF  negative cardio ROS Normal cardiovascular exam Rhythm:Regular Rate:Normal  Can't find echo   Neuro/Psych  Neuromuscular disease negative neurological ROS  negative psych ROS   GI/Hepatic negative GI ROS, Neg liver ROS,GERD  ,,  Endo/Other  negative endocrine ROSdiabetes    Renal/GU negative Renal ROS  negative genitourinary   Musculoskeletal negative musculoskeletal ROS (+) Arthritis ,    Abdominal   Peds negative pediatric ROS (+)  Hematology negative hematology ROS (+) Blood dyscrasia, anemia   Anesthesia Other Findings Hypertension  Arthritis Syncope and collapse  CAD (coronary artery disease) Diabetes mellitus without complication Hyperlipidemia Hypercholesteremia  Dyslipidemia GERD (gastroesophageal reflux disease) Tobacco use Hypertensive heart disease  Unspecified systolic (congestive) heart failure (HCC) ED (erectile dysfunction)  Diabetic neuropathy (HCC) Inguinal hernia  History of acute pyelonephritis Sciatica Osteomyelitis (HCC) Osteomyelitis of left foot (HCC) Wears dentures Anemia  Type 2 diabetes mellitus with diabetic neuropathy (HCC) 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) Ulcer of left foot (HCC) Diabetic polyneuropathy (HCC     Reproductive/Obstetrics negative OB ROS                              Anesthesia Physical Anesthesia Plan  ASA: 3  Anesthesia Plan: MAC   Post-op Pain Management:    Induction: Intravenous  PONV Risk Score and Plan:   Airway Management Planned: Natural Airway and Nasal Cannula  Additional Equipment:   Intra-op Plan:   Post-operative Plan:   Informed Consent: I have reviewed the patients History and Physical, chart, labs and discussed the procedure including the risks, benefits and alternatives for the proposed anesthesia with the patient or authorized representative who has indicated his/her understanding and acceptance.     Dental Advisory Given  Plan Discussed with: Anesthesiologist, CRNA and Surgeon  Anesthesia Plan Comments: (Patient consented for risks of anesthesia including but not limited to:  - adverse reactions to medications - damage to eyes, teeth, lips or other oral mucosa - nerve damage due to positioning  - sore throat or hoarseness - Damage to heart, brain, nerves, lungs, other parts of body or loss of life  Patient voiced understanding and assent.)       Anesthesia Quick Evaluation

## 2023-12-31 NOTE — Discharge Instructions (Signed)

## 2024-01-03 ENCOUNTER — Ambulatory Visit: Payer: Medicare HMO | Admitting: Anesthesiology

## 2024-01-03 ENCOUNTER — Encounter
Admission: RE | Disposition: A | Discharge status: Home / Self Care | Payer: Self-pay | Source: Home / Self Care | Attending: Ophthalmology

## 2024-01-03 ENCOUNTER — Other Ambulatory Visit: Payer: Self-pay

## 2024-01-03 ENCOUNTER — Ambulatory Visit
Admission: RE | Admit: 2024-01-03 | Discharge: 2024-01-03 | Disposition: A | Discharge status: Home / Self Care | Payer: Medicare HMO | Attending: Ophthalmology | Admitting: Ophthalmology

## 2024-01-03 ENCOUNTER — Encounter: Payer: Self-pay | Admitting: Ophthalmology

## 2024-01-03 DIAGNOSIS — H25042 Posterior subcapsular polar age-related cataract, left eye: Secondary | ICD-10-CM | POA: Insufficient documentation

## 2024-01-03 DIAGNOSIS — E1142 Type 2 diabetes mellitus with diabetic polyneuropathy: Secondary | ICD-10-CM | POA: Insufficient documentation

## 2024-01-03 DIAGNOSIS — I25119 Atherosclerotic heart disease of native coronary artery with unspecified angina pectoris: Secondary | ICD-10-CM | POA: Diagnosis not present

## 2024-01-03 DIAGNOSIS — I5022 Chronic systolic (congestive) heart failure: Secondary | ICD-10-CM | POA: Insufficient documentation

## 2024-01-03 DIAGNOSIS — I110 Hypertensive heart disease with heart failure: Secondary | ICD-10-CM | POA: Insufficient documentation

## 2024-01-03 DIAGNOSIS — F17210 Nicotine dependence, cigarettes, uncomplicated: Secondary | ICD-10-CM | POA: Diagnosis not present

## 2024-01-03 DIAGNOSIS — I2510 Atherosclerotic heart disease of native coronary artery without angina pectoris: Secondary | ICD-10-CM | POA: Insufficient documentation

## 2024-01-03 DIAGNOSIS — Z7984 Long term (current) use of oral hypoglycemic drugs: Secondary | ICD-10-CM | POA: Diagnosis not present

## 2024-01-03 DIAGNOSIS — H2512 Age-related nuclear cataract, left eye: Secondary | ICD-10-CM | POA: Insufficient documentation

## 2024-01-03 DIAGNOSIS — E1136 Type 2 diabetes mellitus with diabetic cataract: Secondary | ICD-10-CM | POA: Diagnosis not present

## 2024-01-03 DIAGNOSIS — J449 Chronic obstructive pulmonary disease, unspecified: Secondary | ICD-10-CM | POA: Diagnosis not present

## 2024-01-03 DIAGNOSIS — I509 Heart failure, unspecified: Secondary | ICD-10-CM | POA: Diagnosis not present

## 2024-01-03 HISTORY — PX: CATARACT EXTRACTION W/PHACO: SHX586

## 2024-01-03 HISTORY — DX: Atherosclerotic heart disease of native coronary artery with other forms of angina pectoris: I25.118

## 2024-01-03 HISTORY — DX: Non-pressure chronic ulcer of other part of left foot with unspecified severity: L97.529

## 2024-01-03 HISTORY — DX: Type 2 diabetes mellitus with diabetic polyneuropathy: E11.42

## 2024-01-03 HISTORY — DX: Type 2 diabetes mellitus with diabetic neuropathy, unspecified: E11.40

## 2024-01-03 LAB — GLUCOSE, CAPILLARY: Glucose-Capillary: 115 mg/dL — ABNORMAL HIGH (ref 70–99)

## 2024-01-03 SURGERY — PHACOEMULSIFICATION, CATARACT, WITH IOL INSERTION
Anesthesia: Monitor Anesthesia Care | Site: Eye | Laterality: Left

## 2024-01-03 MED ORDER — TETRACAINE HCL 0.5 % OP SOLN
OPHTHALMIC | Status: AC
  Filled 2024-01-03: qty 4

## 2024-01-03 MED ORDER — SIGHTPATH DOSE#1 BSS IO SOLN
INTRAOCULAR | Status: DC | PRN
  Administered 2024-01-03: 15 mL

## 2024-01-03 MED ORDER — FENTANYL CITRATE (PF) 100 MCG/2ML IJ SOLN
INTRAMUSCULAR | Status: DC | PRN
  Administered 2024-01-03: 50 ug via INTRAVENOUS

## 2024-01-03 MED ORDER — ARMC OPHTHALMIC DILATING DROPS
1.0000 | OPHTHALMIC | Status: DC | PRN
  Administered 2024-01-03 (×3): 1 via OPHTHALMIC

## 2024-01-03 MED ORDER — ARMC OPHTHALMIC DILATING DROPS
OPHTHALMIC | Status: AC
  Filled 2024-01-03: qty 0.5

## 2024-01-03 MED ORDER — TETRACAINE HCL 0.5 % OP SOLN
1.0000 [drp] | OPHTHALMIC | Status: DC | PRN
  Administered 2024-01-03 (×3): 1 [drp] via OPHTHALMIC

## 2024-01-03 MED ORDER — MIDAZOLAM HCL 2 MG/2ML IJ SOLN
INTRAMUSCULAR | Status: DC | PRN
  Administered 2024-01-03 (×2): 1 mg via INTRAVENOUS

## 2024-01-03 MED ORDER — FENTANYL CITRATE (PF) 100 MCG/2ML IJ SOLN
INTRAMUSCULAR | Status: AC
  Filled 2024-01-03: qty 2

## 2024-01-03 MED ORDER — SIGHTPATH DOSE#1 NA HYALUR & NA CHOND-NA HYALUR IO KIT
PACK | INTRAOCULAR | Status: DC | PRN
Start: 2024-01-03 — End: 2024-01-03
  Administered 2024-01-03: 1 via OPHTHALMIC

## 2024-01-03 MED ORDER — LIDOCAINE HCL (PF) 2 % IJ SOLN
INTRAOCULAR | Status: DC | PRN
  Administered 2024-01-03: 1 mL via INTRAOCULAR

## 2024-01-03 MED ORDER — SIGHTPATH DOSE#1 BSS IO SOLN
INTRAOCULAR | Status: DC | PRN
  Administered 2024-01-03: 94 mL via OPHTHALMIC

## 2024-01-03 MED ORDER — MOXIFLOXACIN HCL 0.5 % OP SOLN
OPHTHALMIC | Status: DC | PRN
  Administered 2024-01-03: .2 mL via OPHTHALMIC

## 2024-01-03 MED ORDER — MIDAZOLAM HCL 2 MG/2ML IJ SOLN
INTRAMUSCULAR | Status: AC
  Filled 2024-01-03: qty 2

## 2024-01-03 SURGICAL SUPPLY — 12 items
CATARACT SUITE SIGHTPATH (MISCELLANEOUS) ×1
DISSECTOR HYDRO NUCLEUS 50X22 (MISCELLANEOUS) ×2 IMPLANT
FEE CATARACT SUITE SIGHTPATH (MISCELLANEOUS) ×2 IMPLANT
GLOVE PI ULTRA LF STRL 7.5 (GLOVE) ×2 IMPLANT
GLOVE SURG POLYISOPRENE 8.5 (GLOVE) ×1
GLOVE SURG PROTEXIS BL SZ6.5 (GLOVE) ×1
GLOVE SURG SYN 6.5 PF PI BL (GLOVE) ×2 IMPLANT
GLOVE SURG SYN 8.5 PF PI BL (GLOVE) ×2 IMPLANT
LENS IOL TECNIS EYHANCE 20.5 (Intraocular Lens) IMPLANT
NDL FILTER BLUNT 18X1 1/2 (NEEDLE) ×2 IMPLANT
NEEDLE FILTER BLUNT 18X1 1/2 (NEEDLE) ×1
SYR 3ML LL SCALE MARK (SYRINGE) ×2 IMPLANT

## 2024-01-03 NOTE — Anesthesia Postprocedure Evaluation (Signed)
 Anesthesia Post Note  Patient: Brian Dean  Procedure(s) Performed: CATARACT EXTRACTION PHACO AND INTRAOCULAR LENS PLACEMENT (IOC) LEFT DIABETIC  8.29  00:49.9 (Left: Eye)  Patient location during evaluation: PACU Anesthesia Type: MAC Level of consciousness: awake and alert Pain management: pain level controlled Vital Signs Assessment: post-procedure vital signs reviewed and stable Respiratory status: spontaneous breathing, nonlabored ventilation, respiratory function stable and patient connected to nasal cannula oxygen Cardiovascular status: stable and blood pressure returned to baseline Postop Assessment: no apparent nausea or vomiting Anesthetic complications: no   No notable events documented.   Last Vitals:  Vitals:   01/03/24 0751 01/03/24 0756  BP: 130/62   Pulse: (!) 59 (!) 57  Resp: 18 (!) 22  Temp: 36.5 C (!) 36.4 C  SpO2: 98% 97%    Last Pain:  Vitals:   01/03/24 0756  TempSrc:   PainSc: 0-No pain                 Timm Bonenberger C [facility]

## 2024-01-03 NOTE — Transfer of Care (Signed)
 Immediate Anesthesia Transfer of Care Note  Patient: Brian Dean  Procedure(s) Performed: CATARACT EXTRACTION PHACO AND INTRAOCULAR LENS PLACEMENT (IOC) LEFT DIABETIC  8.29  00:49.9 (Left: Eye)  Patient Location: PACU  Anesthesia Type:MAC  Level of Consciousness: awake and patient cooperative  Airway & Oxygen Therapy: Patient Spontanous Breathing  Post-op Assessment: Report given to RN, Post -op Vital signs reviewed and stable, and Patient moving all extremities X 4  Post vital signs: Reviewed and stable  Last Vitals:  Vitals Value Taken Time  BP    Temp    Pulse    Resp    SpO2      Last Pain:  Vitals:   01/03/24 0650  TempSrc: Temporal  PainSc: 0-No pain         Complications: No notable events documented.

## 2024-01-03 NOTE — Op Note (Signed)
 OPERATIVE NOTE  Brian Dean [id number] 01/03/2024   PREOPERATIVE DIAGNOSIS:  Nuclear sclerotic cataract left eye.  H25.12   POSTOPERATIVE DIAGNOSIS:    Nuclear sclerotic cataract left eye.     PROCEDURE:  Phacoemusification with posterior chamber intraocular lens placement of the left eye   LENS:   Implant Name Type Inv. Item Serial No. Manufacturer Lot No. LRB No. Used Action  LENS IOL TECNIS EYHANCE 20.5 - [id number] Intraocular Lens LENS IOL TECNIS EYHANCE 20.5 [id number] [employer]  Left 1 Implanted      Procedure(s): CATARACT EXTRACTION PHACO AND INTRAOCULAR LENS PLACEMENT (IOC) LEFT DIABETIC  8.29  00:49.9 (Left)  SURGEON:  Willey Blade, MD, MPH   ANESTHESIA:  Topical with tetracaine drops augmented with 1% preservative-free intracameral lidocaine.  ESTIMATED BLOOD LOSS: <1 mL   COMPLICATIONS:  None.   DESCRIPTION OF PROCEDURE:  The patient was identified in the holding room and transported to the operating room and placed in the supine position under the operating microscope.  The left eye was identified as the operative eye and it was prepped and draped in the usual sterile ophthalmic fashion.   A 1.0 millimeter clear-corneal paracentesis was made at the 5:00 position. 0.5 ml of preservative-free 1% lidocaine with epinephrine was injected into the anterior chamber.  The anterior chamber was filled with viscoelastic.  A 2.4 millimeter keratome was used to make a near-clear corneal incision at the 2:00 position.  A curvilinear capsulorrhexis was made with a cystotome and capsulorrhexis forceps.  Balanced salt solution was used to hydrodissect and hydrodelineate the nucleus.   Phacoemulsification was then used in stop and chop fashion to remove the lens nucleus and epinucleus.  The remaining cortex was then removed using the irrigation and aspiration handpiece. Viscoelastic was then placed into the capsular bag to distend it for lens placement.  A lens was then injected into  the capsular bag.  The remaining viscoelastic was aspirated.   Wounds were hydrated with balanced salt solution.  The anterior chamber was inflated to a physiologic pressure with balanced salt solution.  Intracameral vigamox 0.1 mL undiltued was injected into the eye and a drop placed onto the ocular surface.  No wound leaks were noted.  The patient was taken to the recovery room in stable condition without complications of anesthesia or surgery  Willey Blade 01/03/2024, 7:51 AM

## 2024-01-03 NOTE — H&P (Signed)
 [facility]   Primary Care Physician:  Margarita Mail, DO Ophthalmologist: Dr. Willey Blade  Pre-Procedure History & Physical: HPI:  Brian Dean is a 68 y.o. male here for cataract surgery.   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 coli pansensitive   Hypercholesteremia    Hyperlipidemia    Hypertension    Hypertensive heart disease    Inguinal hernia    Osteomyelitis (HCC)    Osteomyelitis of left foot (HCC) 01/03/2019   Sciatica    Syncope and collapse    Tobacco use    Type 2 diabetes mellitus with diabetic neuropathy (HCC)    Ulcer of left foot (HCC)    Unspecified systolic (congestive) heart failure (HCC)    Wears dentures    partial upper and lower    Past Surgical History:  Procedure Laterality Date   AMPUTATION Left 01/04/2019   Procedure: AMPUTATION RAY LEFT GREAT TOE;  Surgeon: Linus Galas, DPM;  Location: [facility] ORS;  Service: Podiatry;  Laterality: Left;   CARDIAC CATHETERIZATION  11/14   [facility] x1   COLONOSCOPY  2004   [facility]   COLONOSCOPY WITH PROPOFOL N/A 06/22/2016   Procedure: COLONOSCOPY WITH PROPOFOL;  Surgeon: Earline Mayotte, MD;  Location: [facility] END[facility]COPY;  Service: Endoscopy;  Laterality: N/A;   COLONOSCOPY WITH PROPOFOL N/A 06/28/2019   Procedure: COLONOSCOPY WITH PROPOFOL;  Surgeon: Toney Reil, MD;  Location: [facility];  Service: Endoscopy;  Laterality: N/A;  Diabetic - oral meds   ELBOW ARTHROSCOPY  2001   lt   ESOPHAGOGASTRODUODENOSCOPY (EGD) WITH PROPOFOL  N/A 06/28/2019   Procedure: ESOPHAGOGASTRODUODENOSCOPY (EGD) WITH PROPOFOL;  Surgeon: Toney Reil, MD;  Location: [facility];  Service: Endoscopy;  Laterality: N/A;   GUM SURGERY     OLECRANON BURSECTOMY  11/12/2011   Procedure: OLECRANON BURSA;  Surgeon: Sharma Covert;  Location: [facility];  Service: Orthopedics;  Laterality: Right;  olecracnon bursectomy with delayed closure   POLYPECTOMY  06/28/2019   Procedure: POLYPECTOMY;  Surgeon: Toney Reil, MD;  Location: [facility];  Service: Endoscopy;;   WISDOM TOOTH EXTRACTION      Prior to Admission medications   Medication Sig Start Date End Date Taking? Authorizing Provider  aspirin 81 MG tablet Take 81 mg by mouth daily. Take 1 tablet (81 mg) by mouth once daily   Yes [provider]  atorvastatin (LIPITOR) 20 MG tablet TAKE 1 TABLET BY MOUTH EVERYDAY AT BEDTIME 08/20/23  Yes Margarita Mail, DO  CINNAMON PO Take 1,000 mg by mouth 2 (two) times daily. Reported on 05/06/2016   Yes [provider]  finasteride (PROSCAR) 5 MG tablet Take 1 tablet (5 mg total) by mouth daily. 08/20/23  Yes Margarita Mail, DO  gabapentin (NEURONTIN) 300 MG capsule Take 1 capsule (300 mg total) by mouth daily. 08/20/23  Yes Margarita Mail, DO  lisinopril (ZESTRIL) 30 MG tablet Take 1 tablet (30 mg total) by mouth daily. 08/20/23  Yes Margarita Mail, DO  metFORMIN (GLUCOPHAGE) 500 MG tablet Take 1 tablet (500 mg total) by mouth 2 (two) times daily with a meal. 08/20/23  Yes Margarita Mail, DO  Multiple Vitamins-Minerals (PRESERVISION AREDS 2 PO) Take by mouth.   Yes [provider]  pantoprazole (PROTONIX) 40 MG tablet TAKE 1 TABLET BY MOUTH EVERY DAY 05/07/23  Yes Margarita Mail, DO  pyridoxine (B-6) 100 MG tablet Take 100 mg by mouth daily.   Yes [provider]  traMADol (ULTRAM) 50 MG tablet Take 1 tablet (50 mg total) by mouth every 8 (eight) hours as needed  for severe pain. 08/20/23  Yes Margarita Mail, DO  nitroGLYCERIN (NITROSTAT) 0.4 MG SL tablet PLACE 1 TABLET UNDER THE TONGUE EVERY 5 MINUTES AS NEEDED. 03/24/23   Antonieta Iba, MD  tadalafil (CIALIS) 5 MG tablet Take 1 tablet (5 mg total) by mouth daily. Reported on 05/06/2016 Patient not taking: Reported on 12/21/2023 08/20/23   Margarita Mail, DO    Allergies as of 12/07/2023   (No Known Allergies)    Family History  Problem Relation Age of Onset   Hypertension Father    Diabetes Father    Heart disease Father    Diabetes Mother    Heart disease Mother    Hypertension Mother    Cancer Mother        bladder   Diabetes Sister    Stroke Sister        mini stroke   Diabetes Brother    Diabetes Sister    Diabetes Sister    Diabetes Sister    Diabetes Sister     Social History   Socioeconomic History   Marital status: Married    Spouse name: Not on file   Number of children: Not on file   Years of education: Not on file   Highest education level: Not on file  Occupational History   Not on file  Tobacco Use   Smoking status: Every Day    Current packs/day: 0.00    Average packs/day: 0.5 packs/day for 47.0 years (23.5 ttl pk-yrs)    Types: Cigarettes    Start date: 11/09/1965    Last attempt to quit: 11/09/2012    Years since quitting: 11.1   Smokeless tobacco: Never   Tobacco comments:    down to 5 cigs/day  Vaping Use   Vaping status: Never Used  Substance and Sexual Activity   Alcohol use: Yes    Alcohol/week: 0.0 standard drinks of alcohol    Comment: occasional (3-4x/yr)   Drug use: Not Currently   Sexual activity: Yes    Birth control/protection: None  Other Topics Concern   Not on file  Social History Narrative   Not on file   Social Drivers of Health   Financial Resource Strain: Low Risk  (10/05/2023)   Received from [facility]   Overall Financial Resource Strain (CARDIA)    Difficulty of Paying Living Expenses: Not  hard at all  Food Insecurity: No Food Insecurity (10/05/2023)   Received from [facility]   Hunger Vital Sign    Worried About Running Out of Food in the Last Year: Never true    Ran Out of Food in the Last Year: Never true  Transportation Needs: No Transportation Needs (10/05/2023)   Received from [facility] - Transportation    In the past 12 months, has lack of transportation kept you from medical appointments or from getting medications?: No  Lack of Transportation (Non-Medical): No  Physical Activity: Sufficiently Active (03/26/2023)   Exercise Vital Sign    Days of Exercise per Week: 7 days    Minutes of Exercise per Session: 30 min  Stress: No Stress Concern Present (03/26/2023)   [employer] of Occupational Health - Occupational Stress Questionnaire    Feeling of Stress : Not at all  Social Connections: Moderately Isolated (03/26/2023)   Social Connection and Isolation Panel [NHANES]    Frequency of Communication with Friends and Family: More than three times a week    Frequency of Social Gatherings with Friends and Family: Once a week    Attends Religious Services: Never    Database administrator or Organizations: No    Attends Banker Meetings: Never    Marital Status: Married  Catering manager Violence: Not At Risk (03/26/2023)   Humiliation, Afraid, Rape, and Kick questionnaire    Fear of Current or Ex-Partner: No    Emotionally Abused: No    Physically Abused: No    Sexually Abused: No    Review of Systems: See HPI, otherwise negative ROS  Physical Exam: BP (!) 159/71   Temp 98.2 F (36.8 C) (Temporal)   Resp 14   Ht 6' 0.01" (1.829 m)   Wt 74.3 kg   SpO2 97%   BMI 22.22 kg/m  General:   Alert, cooperative in NAD Head:  Normocephalic and atraumatic. Respiratory:  Normal work of breathing. Cardiovascular:  RRR  Impression/Plan: Brian Dean is here for cataract surgery.  Risks,  benefits, limitations, and alternatives regarding cataract surgery have been reviewed with the patient.  Questions have been answered.  All parties agreeable.   Willey Blade, MD  01/03/2024, 7:15 AM

## 2024-01-04 ENCOUNTER — Encounter: Payer: Self-pay | Admitting: Ophthalmology

## 2024-01-04 DIAGNOSIS — H2511 Age-related nuclear cataract, right eye: Secondary | ICD-10-CM | POA: Diagnosis not present

## 2024-01-06 NOTE — Anesthesia Preprocedure Evaluation (Addendum)
 Anesthesia Evaluation  Patient identified by MRN, date of birth, ID band Patient awake    Reviewed: Allergy & Precautions, H&P , NPO status , Patient's Chart, lab work & pertinent test results  Airway Mallampati: III  TM Distance: >3 FB Neck ROM: Full    Dental  (+) Upper Dentures, Lower Dentures   Pulmonary COPD, Current Smoker and Patient abstained from smoking.   Pulmonary exam normal breath sounds clear to auscultation       Cardiovascular hypertension, + angina  + CAD and +CHF  Normal cardiovascular exam Rhythm:Regular Rate:Normal  Can't find echo in Epic   Neuro/Psych  Neuromuscular disease negative neurological ROS  negative psych ROS   GI/Hepatic negative GI ROS, Neg liver ROS,GERD  ,,  Endo/Other  diabetes    Renal/GU negative Renal ROS  negative genitourinary   Musculoskeletal negative musculoskeletal ROS (+) Arthritis ,    Abdominal   Peds negative pediatric ROS (+)  Hematology negative hematology ROS (+) Blood dyscrasia, anemia   Anesthesia Other Findings Hypertension  Arthritis Syncope and collapse CAD (coronary artery disease) Diabetes mellitus without complication Hyperlipidemia Hypercholesteremia  Dyslipidemia GERD (gastroesophageal reflux disease) Tobacco use Hypertensive heart disease  Unspecified systolic (congestive) heart failure (HCC) ED (erectile dysfunction)  Diabetic neuropathy  Inguinal hernia  History of acute pyelonephritis Sciatica  Osteomyelitis  Osteomyelitis of left foot Wears dentures Anemia  Type 2 diabetes mellitus with diabetic neuropathy  Atherosclerosis of native coronary artery with stable angina pectoris  Ulcer of left foot  Diabetic polyneuropathy    Previous cataract surgery 01-03-24 Dr. Juel Burrow anesthesiologist     Reproductive/Obstetrics negative OB ROS                              Anesthesia Physical Anesthesia Plan  ASA:  3  Anesthesia Plan: MAC   Post-op Pain Management:    Induction: Intravenous  PONV Risk Score and Plan:   Airway Management Planned: Natural Airway and Nasal Cannula  Additional Equipment:   Intra-op Plan:   Post-operative Plan:   Informed Consent: I have reviewed the patients History and Physical, chart, labs and discussed the procedure including the risks, benefits and alternatives for the proposed anesthesia with the patient or authorized representative who has indicated his/her understanding and acceptance.     Dental Advisory Given  Plan Discussed with: Anesthesiologist, CRNA and Surgeon  Anesthesia Plan Comments: (Patient consented for risks of anesthesia including but not limited to:  - adverse reactions to medications - damage to eyes, teeth, lips or other oral mucosa - nerve damage due to positioning  - sore throat or hoarseness - Damage to heart, brain, nerves, lungs, other parts of body or loss of life  Patient voiced understanding and assent.)         Anesthesia Quick Evaluation

## 2024-01-14 DIAGNOSIS — L851 Acquired keratosis [keratoderma] palmaris et plantaris: Secondary | ICD-10-CM | POA: Diagnosis not present

## 2024-01-14 DIAGNOSIS — B351 Tinea unguium: Secondary | ICD-10-CM | POA: Diagnosis not present

## 2024-01-14 DIAGNOSIS — E1140 Type 2 diabetes mellitus with diabetic neuropathy, unspecified: Secondary | ICD-10-CM | POA: Diagnosis not present

## 2024-01-14 NOTE — Discharge Instructions (Signed)

## 2024-01-17 ENCOUNTER — Other Ambulatory Visit: Payer: Self-pay

## 2024-01-17 ENCOUNTER — Ambulatory Visit
Admission: RE | Admit: 2024-01-17 | Discharge: 2024-01-17 | Disposition: A | Discharge status: Home / Self Care | Payer: Medicare HMO | Attending: Ophthalmology | Admitting: Ophthalmology

## 2024-01-17 ENCOUNTER — Ambulatory Visit: Payer: Self-pay | Admitting: Anesthesiology

## 2024-01-17 ENCOUNTER — Encounter
Admission: RE | Disposition: A | Discharge status: Home / Self Care | Payer: Self-pay | Source: Home / Self Care | Attending: Ophthalmology

## 2024-01-17 ENCOUNTER — Encounter: Payer: Self-pay | Admitting: Ophthalmology

## 2024-01-17 DIAGNOSIS — J449 Chronic obstructive pulmonary disease, unspecified: Secondary | ICD-10-CM | POA: Diagnosis not present

## 2024-01-17 DIAGNOSIS — I25119 Atherosclerotic heart disease of native coronary artery with unspecified angina pectoris: Secondary | ICD-10-CM | POA: Diagnosis not present

## 2024-01-17 DIAGNOSIS — M1990 Unspecified osteoarthritis, unspecified site: Secondary | ICD-10-CM | POA: Diagnosis not present

## 2024-01-17 DIAGNOSIS — I5022 Chronic systolic (congestive) heart failure: Secondary | ICD-10-CM | POA: Insufficient documentation

## 2024-01-17 DIAGNOSIS — I110 Hypertensive heart disease with heart failure: Secondary | ICD-10-CM | POA: Insufficient documentation

## 2024-01-17 DIAGNOSIS — H2511 Age-related nuclear cataract, right eye: Secondary | ICD-10-CM | POA: Diagnosis not present

## 2024-01-17 DIAGNOSIS — E1136 Type 2 diabetes mellitus with diabetic cataract: Secondary | ICD-10-CM | POA: Insufficient documentation

## 2024-01-17 DIAGNOSIS — Z7982 Long term (current) use of aspirin: Secondary | ICD-10-CM | POA: Insufficient documentation

## 2024-01-17 DIAGNOSIS — D649 Anemia, unspecified: Secondary | ICD-10-CM | POA: Diagnosis not present

## 2024-01-17 DIAGNOSIS — Z79899 Other long term (current) drug therapy: Secondary | ICD-10-CM | POA: Insufficient documentation

## 2024-01-17 DIAGNOSIS — E1142 Type 2 diabetes mellitus with diabetic polyneuropathy: Secondary | ICD-10-CM | POA: Insufficient documentation

## 2024-01-17 DIAGNOSIS — K219 Gastro-esophageal reflux disease without esophagitis: Secondary | ICD-10-CM | POA: Diagnosis not present

## 2024-01-17 DIAGNOSIS — Z833 Family history of diabetes mellitus: Secondary | ICD-10-CM | POA: Insufficient documentation

## 2024-01-17 DIAGNOSIS — F17210 Nicotine dependence, cigarettes, uncomplicated: Secondary | ICD-10-CM | POA: Insufficient documentation

## 2024-01-17 DIAGNOSIS — Z7984 Long term (current) use of oral hypoglycemic drugs: Secondary | ICD-10-CM | POA: Insufficient documentation

## 2024-01-17 DIAGNOSIS — I25118 Atherosclerotic heart disease of native coronary artery with other forms of angina pectoris: Secondary | ICD-10-CM | POA: Diagnosis not present

## 2024-01-17 DIAGNOSIS — I5020 Unspecified systolic (congestive) heart failure: Secondary | ICD-10-CM | POA: Diagnosis not present

## 2024-01-17 HISTORY — PX: CATARACT EXTRACTION W/PHACO: SHX586

## 2024-01-17 LAB — GLUCOSE, CAPILLARY: Glucose-Capillary: 108 mg/dL — ABNORMAL HIGH (ref 70–99)

## 2024-01-17 SURGERY — PHACOEMULSIFICATION, CATARACT, WITH IOL INSERTION
Anesthesia: Monitor Anesthesia Care | Laterality: Right

## 2024-01-17 MED ORDER — LIDOCAINE HCL (PF) 2 % IJ SOLN
INTRAOCULAR | Status: DC | PRN
  Administered 2024-01-17: 1 mL via INTRAOCULAR

## 2024-01-17 MED ORDER — TETRACAINE HCL 0.5 % OP SOLN
OPHTHALMIC | Status: AC
  Filled 2024-01-17: qty 4

## 2024-01-17 MED ORDER — ARMC OPHTHALMIC DILATING DROPS
1.0000 | OPHTHALMIC | Status: DC | PRN
  Administered 2024-01-17 (×3): 1 via OPHTHALMIC

## 2024-01-17 MED ORDER — SIGHTPATH DOSE#1 BSS IO SOLN
INTRAOCULAR | Status: DC | PRN
  Administered 2024-01-17: 100 mL via OPHTHALMIC

## 2024-01-17 MED ORDER — ARMC OPHTHALMIC DILATING DROPS
OPHTHALMIC | Status: AC
  Filled 2024-01-17: qty 0.5

## 2024-01-17 MED ORDER — MIDAZOLAM HCL 2 MG/2ML IJ SOLN
INTRAMUSCULAR | Status: DC | PRN
  Administered 2024-01-17: 2 mg via INTRAVENOUS

## 2024-01-17 MED ORDER — FENTANYL CITRATE (PF) 100 MCG/2ML IJ SOLN
INTRAMUSCULAR | Status: AC
Start: 2024-01-17 — End: ?
  Filled 2024-01-17: qty 2

## 2024-01-17 MED ORDER — FENTANYL CITRATE (PF) 100 MCG/2ML IJ SOLN
INTRAMUSCULAR | Status: DC | PRN
  Administered 2024-01-17: 50 ug via INTRAVENOUS

## 2024-01-17 MED ORDER — SIGHTPATH DOSE#1 NA HYALUR & NA CHOND-NA HYALUR IO KIT
PACK | INTRAOCULAR | Status: DC | PRN
  Administered 2024-01-17: 1 via OPHTHALMIC

## 2024-01-17 MED ORDER — SIGHTPATH DOSE#1 BSS IO SOLN
INTRAOCULAR | Status: DC | PRN
  Administered 2024-01-17: 15 mL via INTRAOCULAR

## 2024-01-17 MED ORDER — TETRACAINE HCL 0.5 % OP SOLN
1.0000 [drp] | OPHTHALMIC | Status: DC | PRN
  Administered 2024-01-17 (×2): 1 [drp] via OPHTHALMIC

## 2024-01-17 MED ORDER — MOXIFLOXACIN HCL 0.5 % OP SOLN
OPHTHALMIC | Status: DC | PRN
  Administered 2024-01-17: .2 mL via OPHTHALMIC

## 2024-01-17 MED ORDER — MIDAZOLAM HCL 2 MG/2ML IJ SOLN
INTRAMUSCULAR | Status: AC
  Filled 2024-01-17: qty 2

## 2024-01-17 SURGICAL SUPPLY — 12 items
CATARACT SUITE SIGHTPATH (MISCELLANEOUS) ×1 IMPLANT
DISSECTOR HYDRO NUCLEUS 50X22 (MISCELLANEOUS) ×2 IMPLANT
FEE CATARACT SUITE SIGHTPATH (MISCELLANEOUS) ×2 IMPLANT
GLOVE PI ULTRA LF STRL 7.5 (GLOVE) ×2 IMPLANT
GLOVE SURG POLYISOPRENE 8.5 (GLOVE) ×1 IMPLANT
GLOVE SURG PROTEXIS BL SZ6.5 (GLOVE) ×1 IMPLANT
GLOVE SURG SYN 6.5 PF PI BL (GLOVE) ×2 IMPLANT
GLOVE SURG SYN 8.5 PF PI BL (GLOVE) ×2 IMPLANT
LENS IOL TECNIS EYHANCE 20.5 (Intraocular Lens) IMPLANT
NDL FILTER BLUNT 18X1 1/2 (NEEDLE) ×2 IMPLANT
NEEDLE FILTER BLUNT 18X1 1/2 (NEEDLE) ×1 IMPLANT
SYR 3ML LL SCALE MARK (SYRINGE) ×2 IMPLANT

## 2024-01-17 NOTE — Anesthesia Postprocedure Evaluation (Signed)
 Anesthesia Post Note  Patient: Brian Dean  Procedure(s) Performed: CATARACT EXTRACTION PHACO AND INTRAOCULAR LENS PLACEMENT (IOC) RIGHT DIABETIC 7.23, 00:44.4 (Right)  Patient location during evaluation: PACU Anesthesia Type: MAC Level of consciousness: awake and alert Pain management: pain level controlled Vital Signs Assessment: post-procedure vital signs reviewed and stable Respiratory status: spontaneous breathing, nonlabored ventilation, respiratory function stable and patient connected to nasal cannula oxygen Cardiovascular status: stable and blood pressure returned to baseline Postop Assessment: no apparent nausea or vomiting Anesthetic complications: no   No notable events documented.   Last Vitals:  Vitals:   01/17/24 1037 01/17/24 1042  BP: 134/71 126/86  Pulse: 63 (!) 59  Resp: 15 16  Temp: (!) 36.4 C 36.4 C  SpO2: 98% 99%    Last Pain:  Vitals:   01/17/24 1042  TempSrc:   PainSc: 0-No pain                 Gearldean Lomanto C Hilario Robarts

## 2024-01-17 NOTE — H&P (Signed)
 [facility]   Primary Care Physician:  Margarita Mail, DO Ophthalmologist: Dr. Willey Blade  Pre-Procedure History & Physical: HPI:  Brian Dean is a 68 y.o. male here for cataract surgery.   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 coli pansensitive   Hypercholesteremia    Hyperlipidemia    Hypertension    Hypertensive heart disease    Inguinal hernia    Osteomyelitis (HCC)    Osteomyelitis of left foot (HCC) 01/03/2019   Sciatica    Syncope and collapse    Tobacco use    Type 2 diabetes mellitus with diabetic neuropathy (HCC)    Ulcer of left foot (HCC)    Unspecified systolic (congestive) heart failure (HCC)    Wears dentures    partial upper and lower    Past Surgical History:  Procedure Laterality Date   AMPUTATION Left 01/04/2019   Procedure: AMPUTATION RAY LEFT GREAT TOE;  Surgeon: Linus Galas, DPM;  Location: [facility] ORS;  Service: Podiatry;  Laterality: Left;   CARDIAC CATHETERIZATION  11/14   [facility] x1   CATARACT EXTRACTION W/PHACO Left 01/03/2024   Procedure: CATARACT EXTRACTION PHACO AND INTRAOCULAR LENS PLACEMENT (IOC) LEFT DIABETIC  8.29  00:49.9;  Surgeon: Nevada Crane, MD;  Location: [facility];  Service: Ophthalmology;  Laterality: Left;   [facility]OSCOPY  [facility]   [facility]   COLONOSCOPY WITH PROPOFOL N/A 06/22/2016   Procedure: COLONOSCOPY WITH PROPOFOL;  Surgeon: Earline Mayotte, MD;  Location: [facility] END[facility]COPY;  Service: Endoscopy;  Laterality: N/A;   COLONOSCOPY WITH PROPOFOL N/A  06/28/2019   Procedure: COLONOSCOPY WITH PROPOFOL;  Surgeon: Toney Reil, MD;  Location: [facility];  Service: Endoscopy;  Laterality: N/A;  Diabetic - oral meds   ELBOW ARTHROSCOPY  2001   lt   ESOPHAGOGASTRODUODENOSCOPY (EGD) WITH PROPOFOL N/A 06/28/2019   Procedure: ESOPHAGOGASTRODUODENOSCOPY (EGD) WITH PROPOFOL;  Surgeon: Toney Reil, MD;  Location: [facility];  Service: Endoscopy;  Laterality: N/A;   GUM SURGERY     OLECRANON BURSECTOMY  11/12/2011   Procedure: OLECRANON BURSA;  Surgeon: Sharma Covert;  Location: [facility];  Service: Orthopedics;  Laterality: Right;  olecracnon bursectomy with delayed closure   POLYPECTOMY  06/28/2019   Procedure: POLYPECTOMY;  Surgeon: Toney Reil, MD;  Location: [facility];  Service: Endoscopy;;   WISDOM TOOTH EXTRACTION      Prior to Admission medications   Medication Sig Start Date End Date Taking? Authorizing Provider  aspirin 81 MG tablet Take 81 mg by mouth daily. Take 1 tablet (81 mg) by mouth once daily   Yes [provider]  atorvastatin (LIPITOR) 20 MG tablet TAKE 1 TABLET BY MOUTH EVERYDAY AT BEDTIME 08/20/23  Yes Margarita Mail, DO  CINNAMON PO Take 1,000 mg by mouth 2 (two) times daily. Reported on 05/06/2016   Yes [provider]  finasteride (PROSCAR) 5 MG tablet Take 1 tablet (5 mg total) by mouth daily. 08/20/23  Yes Margarita Mail, DO  gabapentin (NEURONTIN) 300 MG capsule Take 1 capsule (300 mg total) by mouth daily. 08/20/23  Yes Margarita Mail, DO  lisinopril (ZESTRIL) 30 MG tablet Take 1 tablet (30 mg total) by mouth daily. 08/20/23  Yes Margarita Mail, DO  metFORMIN (GLUCOPHAGE) 500 MG tablet Take 1 tablet (500 mg total) by mouth 2 (two) times daily with a meal. 08/20/23  Yes Margarita Mail, DO  Multiple Vitamins-Minerals (PRESERVISION AREDS 2 PO) Take by mouth.   Yes [provider]  pantoprazole (PROTONIX) 40 MG tablet  TAKE 1 TABLET BY MOUTH EVERY DAY 05/07/23  Yes Margarita Mail, DO  pyridoxine (B-6) 100 MG tablet Take 100 mg by mouth daily.   Yes [provider]  tadalafil (CIALIS) 5 MG tablet Take 1 tablet (5 mg total) by mouth daily. Reported on 05/06/2016 08/20/23  Yes Margarita Mail, DO  traMADol (ULTRAM) 50 MG tablet Take 1 tablet (50 mg total) by mouth every 8 (eight) hours as needed for severe pain. 08/20/23  Yes Margarita Mail, DO  nitroGLYCERIN (NITROSTAT) 0.4 MG SL tablet PLACE 1 TABLET UNDER THE TONGUE EVERY 5 MINUTES AS NEEDED. 03/24/23   Antonieta Iba, MD    Allergies as of 12/07/2023   (No Known Allergies)    Family History  Problem Relation Age of Onset   Hypertension Father    Diabetes Father    Heart disease Father    Diabetes Mother    Heart disease Mother    Hypertension Mother    Cancer Mother        bladder   Diabetes Sister    Stroke Sister        mini stroke   Diabetes Brother    Diabetes Sister    Diabetes Sister    Diabetes Sister    Diabetes Sister     Social History   Socioeconomic History   Marital status: Married    Spouse name: Not on file   Number of children: Not on file   Years of education: Not on file   Highest education level: Not on file  Occupational History   Not on file  Tobacco Use   Smoking status: Every Day    Current packs/day: 0.00    Average packs/day: 0.5 packs/day for 47.0 years (23.5 ttl pk-yrs)    Types: Cigarettes    Start date: 11/09/1965    Last attempt to quit: 11/09/2012    Years since quitting: 11.1   Smokeless tobacco: Never   Tobacco comments:    down to 5 cigs/day  Vaping Use   Vaping status: Never Used  Substance and Sexual Activity   Alcohol use: Yes    Alcohol/week: 0.0 standard drinks of alcohol    Comment: occasional (3-4x/yr)   Drug use: Not Currently   Sexual activity: Yes    Birth control/protection: None  Other Topics Concern   Not on file  Social History Narrative   Not on file    Social Drivers of Health   Financial Resource Strain: Low Risk  (10/05/2023)   Received from [facility]   Overall Financial Resource Strain (CARDIA)    Difficulty of Paying Living Expenses: Not hard at all  Food Insecurity: No Food Insecurity (10/05/2023)   Received from [facility]   Hunger Vital Sign    Worried About Running Out of Food in the Last Year: Never true    Ran Out of Food in the Last Year: Never true  Transportation Needs: No Transportation Needs (  10/05/2023)   Received from [facility]   PRAPARE - Transportation    In the past 12 months, has lack of transportation kept you from medical appointments or from getting medications?: No    Lack of Transportation (Non-Medical): No  Physical Activity: Sufficiently Active (03/26/2023)   Exercise Vital Sign    Days of Exercise per Week: 7 days    Minutes of Exercise per Session: 30 min  Stress: No Stress Concern Present (03/26/2023)   [employer] of Occupational Health - Occupational Stress Questionnaire    Feeling of Stress : Not at all  Social Connections: Moderately Isolated (03/26/2023)   Social Connection and Isolation Panel [NHANES]    Frequency of Communication with Friends and Family: More than three times a week    Frequency of Social Gatherings with Friends and Family: Once a week    Attends Religious Services: Never    Database administrator or Organizations: No    Attends Banker Meetings: Never    Marital Status: Married  Catering manager Violence: Not At Risk (03/26/2023)   Humiliation, Afraid, Rape, and Kick questionnaire    Fear of Current or Ex-Partner: No    Emotionally Abused: No    Physically Abused: No    Sexually Abused: No    Review of Systems: See HPI, otherwise negative ROS  Physical Exam: BP 139/69   Pulse 60   Temp 98.5 F (36.9 C) (Temporal)   Resp 12   Ht 6' (1.829 m)   Wt 75.3 kg   SpO2 96%   BMI 22.51 kg/m   General:   Alert, cooperative in NAD Head:  Normocephalic and atraumatic. Respiratory:  Normal work of breathing. Cardiovascular:  RRR  Impression/Plan: Brian Dean is here for cataract surgery.  Risks, benefits, limitations, and alternatives regarding cataract surgery have been reviewed with the patient.  Questions have been answered.  All parties agreeable.   Willey Blade, MD  01/17/2024, 10:05 AM

## 2024-01-17 NOTE — Op Note (Signed)
 OPERATIVE NOTE  Brian Dean [id number] 01/17/2024   PREOPERATIVE DIAGNOSIS:  Nuclear sclerotic cataract right eye.  H25.11   POSTOPERATIVE DIAGNOSIS:    Nuclear sclerotic cataract right eye.     PROCEDURE:  Phacoemusification with posterior chamber intraocular lens placement of the right eye   LENS:   Implant Name Type Inv. Item Serial No. Manufacturer Lot No. LRB No. Used Action  LENS IOL TECNIS EYHANCE 20.5 - [id number] Intraocular Lens LENS IOL TECNIS EYHANCE 20.5 [id number] [employer]  Right 1 Implanted       Procedure(s): CATARACT EXTRACTION PHACO AND INTRAOCULAR LENS PLACEMENT (IOC) RIGHT DIABETIC 7.23, 00:44.4 (Right)  SURGEON:  Willey Blade, MD, MPH  ANESTHESIOLOGIST: Anesthesiologist: Marisue Humble, MD CRNA: Lanell Matar, CRNA   ANESTHESIA:  Topical with tetracaine drops augmented with 1% preservative-free intracameral lidocaine.  ESTIMATED BLOOD LOSS: less than 1 mL.   COMPLICATIONS:  None.   DESCRIPTION OF PROCEDURE:  The patient was identified in the holding room and transported to the operating room and placed in the supine position under the operating microscope.  The right eye was identified as the operative eye and it was prepped and draped in the usual sterile ophthalmic fashion.   A 1.0 millimeter clear-corneal paracentesis was made at the 10:30 position. 0.5 ml of preservative-free 1% lidocaine with epinephrine was injected into the anterior chamber.  The anterior chamber was filled with viscoelastic.  A 2.4 millimeter keratome was used to make a near-clear corneal incision at the 8:00 position.  A curvilinear capsulorrhexis was made with a cystotome and capsulorrhexis forceps.  Balanced salt solution was used to hydrodissect and hydrodelineate the nucleus.   Phacoemulsification was then used in stop and chop fashion to remove the lens nucleus and epinucleus.  The remaining cortex was then removed using the irrigation and aspiration handpiece.  Viscoelastic was then placed into the capsular bag to distend it for lens placement.  A lens was then injected into the capsular bag.  The remaining viscoelastic was aspirated.   Wounds were hydrated with balanced salt solution.  The anterior chamber was inflated to a physiologic pressure with balanced salt solution.   Intracameral vigamox 0.1 mL undiluted was injected into the eye and a drop placed onto the ocular surface.  No wound leaks were noted.  The patient was taken to the recovery room in stable condition without complications of anesthesia or surgery  Willey Blade 01/17/2024, 10:35 AM

## 2024-01-17 NOTE — Transfer of Care (Signed)
 Immediate Anesthesia Transfer of Care Note  Patient: Brian Dean  Procedure(s) Performed: CATARACT EXTRACTION PHACO AND INTRAOCULAR LENS PLACEMENT (IOC) RIGHT DIABETIC 7.23, 00:44.4 (Right)  Patient Location: PACU  Anesthesia Type:MAC  Level of Consciousness: awake, drowsy, and patient cooperative  Airway & Oxygen Therapy: Patient Spontanous Breathing  Post-op Assessment: Report given to RN, Post -op Vital signs reviewed and stable, and Patient moving all extremities X 4  Post vital signs: Reviewed and stable  Last Vitals:  Vitals Value Taken Time  BP    Temp    Pulse 63 01/17/24 1038  Resp    SpO2 98 % 01/17/24 1038  Vitals shown include unfiled device data.  Last Pain:  Vitals:   01/17/24 0919  TempSrc: Temporal  PainSc: 0-No pain         Complications: No notable events documented.

## 2024-01-18 ENCOUNTER — Encounter: Payer: Self-pay | Admitting: Ophthalmology

## 2024-01-25 ENCOUNTER — Other Ambulatory Visit: Payer: Self-pay | Admitting: Internal Medicine

## 2024-01-25 DIAGNOSIS — E1142 Type 2 diabetes mellitus with diabetic polyneuropathy: Secondary | ICD-10-CM

## 2024-01-27 NOTE — Telephone Encounter (Signed)
 Requested Prescriptions  Pending Prescriptions Disp Refills   gabapentin (NEURONTIN) 300 MG capsule [Pharmacy Med Name: GABAPENTIN 300 MG CAPSULE] 90 capsule 0    Sig: TAKE 1 CAPSULE BY MOUTH EVERY DAY     Neurology: Anticonvulsants - gabapentin Passed - 01/27/2024  9:17 AM      Passed - Cr in normal range and within 360 days    Creat  Date Value Ref Range Status  02/18/2023 1.06 0.70 - 1.35 mg/dL Final   Creatinine, Urine  Date Value Ref Range Status  08/20/2023 100 20 - 320 mg/dL Final         Passed - Completed PHQ-2 or PHQ-9 in the last 360 days      Passed - Valid encounter within last 12 months    Recent Outpatient Visits           5 months ago Controlled type 2 diabetes mellitus with diabetic polyneuropathy, without long-term current use of insulin [facility])   [place] [facility] Margarita Mail, DO   11 months ago Diabetic polyneuropathy associated with type 2 diabetes mellitus [facility])   [place] [facility] Margarita Mail, DO   1 year ago COVID-19   [facility] Mecum, Oswaldo Conroy, PA-C   1 year ago Controlled type 2 diabetes mellitus with hyperglycemia, without long-term current use of insulin [facility])   [place] [facility] Margarita Mail, DO   1 year ago Essential hypertension   [facility] Margarita Mail, DO       Future Appointments             In 3 weeks Margarita Mail, DO [place] [facility], [facility]   In 1 month Gollan, Tollie Pizza, MD [facility]

## 2024-02-11 ENCOUNTER — Other Ambulatory Visit: Payer: Self-pay | Admitting: Internal Medicine

## 2024-02-11 DIAGNOSIS — K219 Gastro-esophageal reflux disease without esophagitis: Secondary | ICD-10-CM

## 2024-02-11 DIAGNOSIS — Z961 Presence of intraocular lens: Secondary | ICD-10-CM | POA: Diagnosis not present

## 2024-02-11 NOTE — Telephone Encounter (Signed)
 Last OV 08/20/23 Requested Prescriptions  Pending Prescriptions Disp Refills   pantoprazole (PROTONIX) 40 MG tablet [Pharmacy Med Name: PANTOPRAZOLE SOD DR 40 MG TAB] 90 tablet 2    Sig: TAKE 1 TABLET BY MOUTH EVERY DAY     Gastroenterology: Proton Pump Inhibitors Failed - 02/11/2024  2:34 PM      Failed - Valid encounter within last 12 months    Recent Outpatient Visits   None     Future Appointments             In 1 week Margarita Mail, DO [place] [facility], [facility]   In 1 month Gollan, Tollie Pizza, MD [facility] at [facility]

## 2024-02-18 ENCOUNTER — Ambulatory Visit: Payer: Self-pay | Admitting: Internal Medicine

## 2024-02-18 ENCOUNTER — Encounter: Payer: Self-pay | Admitting: Internal Medicine

## 2024-02-18 ENCOUNTER — Other Ambulatory Visit: Payer: Self-pay

## 2024-02-18 VITALS — BP 118/76 | HR 82 | Temp 97.9°F | Resp 16 | Ht 72.0 in | Wt 162.3 lb

## 2024-02-18 DIAGNOSIS — E1159 Type 2 diabetes mellitus with other circulatory complications: Secondary | ICD-10-CM

## 2024-02-18 DIAGNOSIS — E1142 Type 2 diabetes mellitus with diabetic polyneuropathy: Secondary | ICD-10-CM

## 2024-02-18 DIAGNOSIS — E782 Mixed hyperlipidemia: Secondary | ICD-10-CM

## 2024-02-18 DIAGNOSIS — Z125 Encounter for screening for malignant neoplasm of prostate: Secondary | ICD-10-CM | POA: Diagnosis not present

## 2024-02-18 DIAGNOSIS — Z7984 Long term (current) use of oral hypoglycemic drugs: Secondary | ICD-10-CM

## 2024-02-18 DIAGNOSIS — I10 Essential (primary) hypertension: Secondary | ICD-10-CM | POA: Diagnosis not present

## 2024-02-18 DIAGNOSIS — N401 Enlarged prostate with lower urinary tract symptoms: Secondary | ICD-10-CM

## 2024-02-18 MED ORDER — FINASTERIDE 5 MG PO TABS: 5.0000 mg | ORAL_TABLET | Freq: Every day | ORAL | 1 refills | Status: DC

## 2024-02-18 MED ORDER — LISINOPRIL 30 MG PO TABS: 30.0000 mg | ORAL_TABLET | Freq: Every day | ORAL | 1 refills | Status: DC

## 2024-02-18 MED ORDER — ATORVASTATIN CALCIUM 20 MG PO TABS: ORAL_TABLET | ORAL | 1 refills | Status: DC

## 2024-02-18 MED ORDER — METFORMIN HCL 500 MG PO TABS: 500.0000 mg | ORAL_TABLET | Freq: Two times a day (BID) | ORAL | 1 refills | Status: DC

## 2024-02-18 NOTE — Progress Notes (Signed)
 Established Patient Office Visit  Subjective:  Patient ID: Brian Dean, male    DOB: Jan 18, 1956  Age: 68 y.o. MRN: [id number]  CC:  Chief Complaint  Patient presents with   Medical Management of Chronic Issues    6 month recheck    HPI Brian Dean presents for follow up on chronic medical conditions.   Hypertension: -Medications: Lisinopril 30 mg -Patient is compliant with above medications and reports no side effects. -Checking BP at home (average): has a cuff but not checking -Denies any SOB, CP, vision changes, LE edema or symptoms of hypotension -Follows with Cardiology  HLD/CAD: -Medications: Lipitor 20 mg -Patient is compliant with above medications and reports no side effects.  -Last lipid panel: Lipid Panel     Component Value Date/Time   CHOL 129 02/18/2023 1337   CHOL 129 10/31/2015 0924   TRIG 162 (H) 02/18/2023 1337   HDL 45 02/18/2023 1337   HDL 43 10/31/2015 0924   CHOLHDL 2.9 02/18/2023 1337   VLDL 19 04/27/2017 0917   LDLCALC 60 02/18/2023 1337   LABVLDL 14 10/31/2015 0924    Diabetes, Type 2 with polyneuropathy: -Last A1c 6.0% 10/24 -Medications: Metformin 500 mg BID, Gabapentin 300 mg once daily  -Patient is compliant with the above medications and reports no side effects.  -Checking BG at home: doesn't check  -Eye exam: UTD  -Foot exam: Due today -Microalbumin: UTD 10/24 -Statin: yes -PNA vaccine: UTD -Denies symptoms of hypoglycemia, polyuria, polydipsia, numbness extremities, foot ulcers/trauma.   GERD: -Currently on Protonix 40 mg, symptoms stable  Lumbar Pain:  -Cannot take NSAIDs -Takes Tramadol 50 mg rarely   Health Maintenance: -Blood work due -Colonoscopy 06/2019: repeat in 5 years -Discussed lung cancer screening, politely declines  Past Medical History:  Diagnosis Date   Anemia    Arthritis    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)    CAD (coronary artery disease) 09/2013    diffuse, aggresive risk factor modification recommended   Diabetes mellitus without complication (HCC)    Diabetic neuropathy (HCC)    Diabetic polyneuropathy (HCC)    Dyslipidemia    ED (erectile dysfunction)    GERD (gastroesophageal reflux disease)    History of acute pyelonephritis 06/2013   hospitalized; 100k E. coli pansensitive   Hypercholesteremia    Hyperlipidemia    Hypertension    Hypertensive heart disease    Inguinal hernia    Osteomyelitis (HCC)    Osteomyelitis of left foot (HCC) 01/03/2019   Sciatica    Syncope and collapse    Tobacco use    Type 2 diabetes mellitus with diabetic neuropathy (HCC)    Ulcer of left foot (HCC)    Unspecified systolic (congestive) heart failure (HCC)    Wears dentures    partial upper and lower    Past Surgical History:  Procedure Laterality Date   AMPUTATION Left 01/04/2019   Procedure: AMPUTATION RAY LEFT GREAT TOE;  Surgeon: Linus Galas, DPM;  Location: [facility] OR[facility];  Service: Podiatry;  Laterality: Left;   CARDIAC CATHETERIZATION  11/14   [facility] x1   CATARACT EXTRACTION W/PHACO Left 01/03/2024   Procedure: CATARACT EXTRACTION PHACO AND INTRAOCULAR LENS PLACEMENT (IOC) LEFT DIABETIC  8.29  00:49.9;  Surgeon: Nevada Crane, MD;  Location: [facility];  Service: Ophthalmology;  Laterality: Left;   CATARACT EXTRACTION W/PHACO Right 01/17/2024   Procedure: CATARACT EXTRACTION PHACO AND INTRAOCULAR LENS PLACEMENT (IOC) RIGHT DIABETIC 7.23, 00:44.4;  Surgeon: Brooke Dare,  Earl Gala, MD;  Location: [facility];  Service: Ophthalmology;  Laterality: Right;   COLONOSCOPY  2004   [facility]   COLONOSCOPY WITH PROPOFOL N/A 06/22/2016   Procedure: COLONOSCOPY WITH PROPOFOL;  Surgeon: Earline Mayotte, MD;  Location: [facility];  Service: Endoscopy;  Laterality: N/A;   COLONOSCOPY WITH PROPOFOL N/A 06/28/2019   Procedure: COLONOSCOPY WITH PROPOFOL;  Surgeon: Toney Reil, MD;  Location: [facility];  Service: Endoscopy;   Laterality: N/A;  Diabetic - oral meds   ELBOW ARTHROSCOPY  2001   lt   ESOPHAGOGASTRODUODENOSCOPY (EGD) WITH PROPOFOL N/A 06/28/2019   Procedure: ESOPHAGOGASTRODUODENOSCOPY (EGD) WITH PROPOFOL;  Surgeon: Toney Reil, MD;  Location: [facility];  Service: Endoscopy;  Laterality: N/A;   GUM SURGERY     OLECRANON BURSECTOMY  11/12/2011   Procedure: OLECRANON BURSA;  Surgeon: Sharma Covert;  Location: [facility];  Service: Orthopedics;  Laterality: Right;  olecracnon bursectomy with delayed closure   POLYPECTOMY  06/28/2019   Procedure: POLYPECTOMY;  Surgeon: Toney Reil, MD;  Location: [facility];  Service: Endoscopy;;   WISDOM TOOTH EXTRACTION      Family History  Problem Relation Age of Onset   Hypertension Father    Diabetes Father    Heart disease Father    Diabetes Mother    Heart disease Mother    Hypertension Mother    Cancer Mother        bladder   Diabetes Sister    Stroke Sister        mini stroke   Diabetes Brother    Diabetes Sister    Diabetes Sister    Diabetes Sister    Diabetes Sister     Social History   Socioeconomic History   Marital status: Married    Spouse name: Not on file   Number of children: Not on file   Years of education: Not on file   Highest education level: Not on file  Occupational History   Not on file  Tobacco Use   Smoking status: Every Day    Current packs/day: 0.00    Average packs/day: 0.5 packs/day for 47.0 years (23.5 ttl pk-yrs)    Types: Cigarettes    Start date: 11/09/1965    Last attempt to quit: 11/09/2012    Years since quitting: 11.2   Smokeless tobacco: Never   Tobacco comments:    down to 5 cigs/day  Vaping Use   Vaping status: Never Used  Substance and Sexual Activity   Alcohol use: Yes    Alcohol/week: 0.0 standard drinks of alcohol    Comment: occasional (3-4x/yr)   Drug use: Not Currently   Sexual activity: Yes    Birth control/protection: None  Other Topics  Concern   Not on file  Social History Narrative   Not on file   Social Drivers of Health   Financial Resource Strain: Low Risk  (10/05/2023)   [facility] from [facility]   Overall Financial Resource Strain (CARDIA)    Difficulty of Paying Living Expenses: Not hard at all  Food Insecurity: No Food Insecurity (10/05/2023)   [facility]   Hunger Vital Sign    Worried About Running Out of Food in the Last Year: Never true    Ran Out of Food in the Last Year: Never true  Transportation Needs: No Transportation Needs (10/05/2023)   [facility] from [facility]   [facility] - Transportation  In the past 12 months, has lack of transportation kept you from medical appointments or from getting medications?: No    Lack of Transportation (Non-Medical): No  Physical Activity: Sufficiently Active (03/26/2023)   Exercise Vital Sign    Days of Exercise per Week: 7 days    Minutes of Exercise per Session: 30 min  Stress: No Stress Concern Present (03/26/2023)   [employer] of Occupational Health - Occupational Stress Questionnaire    Feeling of Stress : Not at all  Social Connections: Moderately Isolated (03/26/2023)   Social Connection and Isolation Panel [NHANES]    Frequency of Communication with Friends and Family: More than three times a week    Frequency of Social Gatherings with Friends and Family: Once a week    Attends Religious Services: Never    Database administrator or Organizations: No    Attends Banker Meetings: Never    Marital Status: Married  Catering manager Violence: Not At Risk (03/26/2023)   Humiliation, Afraid, Rape, and Kick questionnaire    Fear of Current or Ex-Partner: No    Emotionally Abused: No    Physically Abused: No    Sexually Abused: No    Outpatient Medications Prior to Visit  Medication Sig Dispense Refill   aspirin 81 MG tablet Take 81 mg by mouth daily. Take 1 tablet (81  mg) by mouth once daily     atorvastatin (LIPITOR) 20 MG tablet TAKE 1 TABLET BY MOUTH EVERYDAY AT BEDTIME 90 tablet 1   CINNAMON PO Take 1,000 mg by mouth 2 (two) times daily. Reported on 05/06/2016     finasteride (PROSCAR) 5 MG tablet Take 1 tablet (5 mg total) by mouth daily. 90 tablet 1   gabapentin (NEURONTIN) 300 MG capsule TAKE 1 CAPSULE BY MOUTH EVERY DAY 90 capsule 0   lisinopril (ZESTRIL) 30 MG tablet Take 1 tablet (30 mg total) by mouth daily. 90 tablet 1   metFORMIN (GLUCOPHAGE) 500 MG tablet Take 1 tablet (500 mg total) by mouth 2 (two) times daily with a meal. 180 tablet 1   Multiple Vitamins-Minerals (PRESERVISION AREDS 2 PO) Take by mouth.     nitroGLYCERIN (NITROSTAT) 0.4 MG SL tablet PLACE 1 TABLET UNDER THE TONGUE EVERY 5 MINUTES AS NEEDED. 25 tablet 0   pantoprazole (PROTONIX) 40 MG tablet TAKE 1 TABLET BY MOUTH EVERY DAY 90 tablet 1   pyridoxine (B-6) 100 MG tablet Take 100 mg by mouth daily.     tadalafil (CIALIS) 5 MG tablet Take 1 tablet (5 mg total) by mouth daily. Reported on 05/06/2016 30 tablet 0   traMADol (ULTRAM) 50 MG tablet Take 1 tablet (50 mg total) by mouth every 8 (eight) hours as needed for severe pain. 20 tablet 0   No facility-administered medications prior to visit.    Allergies  Allergen Reactions   Ibuprofen Diarrhea    ROS Review of Systems  All other systems reviewed and are negative.     Objective:    Physical Exam Constitutional:      Appearance: Normal appearance.  HENT:     Head: Normocephalic and atraumatic.  Eyes:     Conjunctiva/sclera: Conjunctivae normal.  Cardiovascular:     Rate and Rhythm: Normal rate and regular rhythm.     Pulses:          Dorsalis pedis pulses are 2+ on the right side and 2+ on the left side.  Pulmonary:     Effort: Pulmonary effort is normal.  Breath sounds: Normal breath sounds.  Musculoskeletal:     Right foot: Normal range of motion. No deformity, bunion, Charcot foot, foot drop or  prominent metatarsal heads.     Left foot: Normal range of motion. No deformity, bunion, Charcot foot, foot drop or prominent metatarsal heads.     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  Feet:     Right foot:     Protective Sensation: 6 sites tested.  5 sites sensed.     Skin integrity: Skin integrity normal.     Toenail Condition: Right toenails are normal.     Left foot:     Protective Sensation: 6 sites tested.  6 sites sensed.     Skin integrity: Skin integrity normal.     Toenail Condition: Left toenails are normal.  Skin:    General: Skin is warm and dry.  Neurological:     General: No focal deficit present.     Mental Status: He is alert. Mental status is at baseline.  Psychiatric:        Mood and Affect: Mood normal.        Behavior: Behavior normal.     BP 118/76 (Cuff Size: Large)   Pulse 82   Temp 97.9 F (36.6 C) (Oral)   Resp 16   Ht 6' (1.829 m)   Wt 162 lb 4.8 oz (73.6 kg)   SpO2 97%   BMI 22.01 kg/m  Wt Readings from Last 3 Encounters:  02/18/24 162 lb 4.8 oz (73.6 kg)  01/17/24 166 lb (75.3 kg)  01/03/24 163 lb 14.4 oz (74.3 kg)     Health Maintenance Due  Topic Date Due   Zoster Vaccines- Shingrix (1 of 2) Never done   Lung Cancer Screening  Never done   FOOT EXAM  08/19/2023   Diabetic kidney evaluation - eGFR measurement  02/18/2024   HEMOGLOBIN A1C  02/18/2024   Medicare Annual Wellness (AWV)  03/25/2024    There are no preventive care reminders to display for this patient.  No results found for: "TSH" Lab Results  Component Value Date   WBC 6.4 02/18/2023   HGB 12.4 (L) 02/18/2023   HCT 37.1 (L) 02/18/2023   MCV 92.5 02/18/2023   PLT 243 02/18/2023   Lab Results  Component Value Date   NA 137 02/18/2023   K 4.7 02/18/2023   CO2 27 02/18/2023   GLUCOSE 135 (H) 02/18/2023   BUN 16 02/18/2023   CREATININE 1.06 02/18/2023   BILITOT 0.8 02/18/2023   ALKPHOS 48 01/03/2019   AST 22 02/18/2023   ALT 24 02/18/2023    PROT 6.8 02/18/2023   ALBUMIN 3.7 01/03/2019   CALCIUM 9.4 02/18/2023   ANIONGAP 10 01/05/2019   EGFR 77 02/18/2023   Lab Results  Component Value Date   CHOL 129 02/18/2023   Lab Results  Component Value Date   HDL 45 02/18/2023   Lab Results  Component Value Date   LDLCALC 60 02/18/2023   Lab Results  Component Value Date   TRIG 162 (H) 02/18/2023   Lab Results  Component Value Date   CHOLHDL 2.9 02/18/2023   Lab Results  Component Value Date   HGBA1C 6.0 (A) 08/20/2023      Assessment & Plan:   Controlled type 2 diabetes mellitus with diabetic polyneuropathy, without long-term current use of insulin (HCC) Assessment & Plan: Recheck A1c today. No changes made to medications. Foot exam performed.   Orders: -  CBC with Differential/Platelet -     COMPLETE METABOLIC PANEL WITHOUT GFR -     Hemoglobin A1c -     HM Diabetes Foot Exam -     metFORMIN HCl; Take 1 tablet (500 mg total) by mouth 2 (two) times daily with a meal.  Dispense: 180 tablet; Refill: 1  Hypertension, unspecified type Assessment & Plan: Blood pressure stable here today, no changes made to medications and appropriate refills sent to pharmacy. Labs due.   Orders: -     Lisinopril; Take 1 tablet (30 mg total) by mouth daily.  Dispense: 90 tablet; Refill: 1  Mixed hyperlipidemia Assessment & Plan: Recheck labs, continue statin.   Orders: -     Lipid panel -     Atorvastatin Calcium; TAKE 1 TABLET BY MOUTH EVERYDAY AT BEDTIME  Dispense: 90 tablet; Refill: 1  Benign prostatic hyperplasia with lower urinary tract symptoms, symptom details unspecified Assessment & Plan: Stable, refill Finasteride.   Orders: -     Finasteride; Take 1 tablet (5 mg total) by mouth daily.  Dispense: 90 tablet; Refill: 1  Prostate cancer screening -     PSA      Follow-up: Return in about 6 months (around 08/19/2024).    Margarita Mail, DO

## 2024-02-18 NOTE — Assessment & Plan Note (Signed)
 Blood pressure stable here today, no changes made to medications and appropriate refills sent to pharmacy. Labs due.

## 2024-02-18 NOTE — Assessment & Plan Note (Signed)
 Stable, refill Finasteride.

## 2024-02-18 NOTE — Assessment & Plan Note (Signed)
 Recheck labs, continue statin.

## 2024-02-18 NOTE — Assessment & Plan Note (Addendum)
 Recheck A1c today. No changes made to medications. Foot exam performed.

## 2024-02-19 LAB — CBC WITH DIFFERENTIAL/PLATELET
Absolute Lymphocytes: 1646 {cells}/uL (ref 850–3900)
Absolute Monocytes: 558 {cells}/uL (ref 200–950)
Basophils Absolute: 48 {cells}/uL (ref 0–200)
Basophils Relative: 0.7 %
Eosinophils Absolute: 122 {cells}/uL (ref 15–500)
Eosinophils Relative: 1.8 %
HCT: 38.1 % — ABNORMAL LOW (ref 38.5–50.0)
Hemoglobin: 12.6 g/dL — ABNORMAL LOW (ref 13.2–17.1)
MCH: 31.2 pg (ref 27.0–33.0)
MCHC: 33.1 g/dL (ref 32.0–36.0)
MCV: 94.3 fL (ref 80.0–100.0)
MPV: 10 fL (ref 7.5–12.5)
Monocytes Relative: 8.2 %
Neutro Abs: 4427 {cells}/uL (ref 1500–7800)
Neutrophils Relative %: 65.1 %
Platelets: 225 10*3/uL (ref 140–400)
RBC: 4.04 10*6/uL — ABNORMAL LOW (ref 4.20–5.80)
RDW: 11.7 % (ref 11.0–15.0)
Total Lymphocyte: 24.2 %
WBC: 6.8 10*3/uL (ref 3.8–10.8)

## 2024-02-19 LAB — HEMOGLOBIN A1C
Hgb A1c MFr Bld: 6.5 %{Hb} — ABNORMAL HIGH (ref <5.7)
Mean Plasma Glucose: 140 mg/dL
eAG (mmol/L): 7.7 mmol/L

## 2024-02-19 LAB — COMPLETE METABOLIC PANEL WITHOUT GFR
AG Ratio: 1.9 (calc) (ref 1.0–2.5)
ALT: 24 U/L (ref 9–46)
AST: 22 U/L (ref 10–35)
Albumin: 4.4 g/dL (ref 3.6–5.1)
Alkaline phosphatase (APISO): 57 U/L (ref 35–144)
BUN: 17 mg/dL (ref 7–25)
CO2: 28 mmol/L (ref 20–32)
Calcium: 9.4 mg/dL (ref 8.6–10.3)
Chloride: 102 mmol/L (ref 98–110)
Creat: 1.2 mg/dL (ref 0.70–1.35)
Globulin: 2.3 g/dL (ref 1.9–3.7)
Glucose, Bld: 142 mg/dL — ABNORMAL HIGH (ref 65–99)
Potassium: 4.7 mmol/L (ref 3.5–5.3)
Sodium: 138 mmol/L (ref 135–146)
Total Bilirubin: 0.8 mg/dL (ref 0.2–1.2)
Total Protein: 6.7 g/dL (ref 6.1–8.1)

## 2024-02-19 LAB — LIPID PANEL
Cholesterol: 125 mg/dL (ref <200)
HDL: 43 mg/dL (ref >=40)
LDL Cholesterol (Calc): 52 mg/dL
Non-HDL Cholesterol (Calc): 82 mg/dL (ref <130)
Total CHOL/HDL Ratio: 2.9 (calc) (ref <5.0)
Triglycerides: 251 mg/dL — ABNORMAL HIGH (ref <150)

## 2024-02-19 LAB — PSA: PSA: 0.29 ng/mL (ref <=4.00)

## 2024-03-21 NOTE — Progress Notes (Unsigned)
 cardiology Office Note  Date:  03/24/2024   ID:  Brian Dean, DOB 1955/12/02, MRN [id number]  PCP:  Rockney Cid, DO   Chief Complaint  Patient presents with   12 month follow up     "Doing well."     HPI:  [name] is a 68 y.o. retired, gentleman with a history of  Diabetes  Hypertension  Coronary Artery Disease, Coronary Artery Stent Placement,  proximal left circumflex, 09/25/2013 Long smoking history, 0.5 ppd, 20 pack years Smokes 5-6 per day Gallstone Prostate issues Who presents for follow up of of Coronary Artery Disease  Last seen by myself in clinic 5/24  In follow-up reports he is doing well Active at home, denies significant chest pain or shortness of breath concerning for angina  Continues to smoke 5 to 6 cigarettes/day  Labs reviewed Total chol 124, LDL 60 A1C 6.2  EKG personally reviewed by myself on todays visit EKG Interpretation Date/Time:  Friday Mar 24 2024 09:04:23 EDT Ventricular Rate:  64 PR Interval:  146 QRS Duration:  128 QT Interval:  458 QTC Calculation: 472 R Axis:   -73  Text Interpretation: Sinus rhythm with occasional Premature ventricular complexes Left axis deviation Right bundle branch block When compared with ECG of 15-Sep-2013 09:54, Premature ventricular complexes are now Present Right bundle branch block is now Present Confirmed by Belva Boyden [phone]) on 03/24/2024 9:23:53 AM    OTHER PAST MEDICAL HISTORY REVIEWED BY ME FOR TODAY'S VISIT: 2019 12/13/2017 EKG showed sinus bradycardia rate 49 bpm T wave abnormality lead III aVF Similar to previous EKG 2017  2014 09/25/2013 Stent placed to his proximal left circumflex 09/15/2013 Cardiac catheterization showed severe proximal left circumflex disease, 50% lesions in his LAD, 40% RCA disease. 07/26/2013 Previous Stress Myoview showed what appears to be a proximally occluded RCA with peri-infarct ischemia.  Left anterior descending and left circumflex territory appear  intact with no perfusion abnormality. Ejection fraction 47% with wall motion abnormality in the inferior wall. 07/08/2013 - 07/09/2013 Hospitalized for acute pyelonephritis, acute on chronic low back pain, hyponatremia, diabetes, hypertension 07/07/2013 CT scan shows large calcified stone in the gallbladder neck, enlarged prostate He denied having any symptoms from his gallbladder stones   PMH:   has a past medical history of Anemia, Arthritis, 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), CAD (coronary artery disease) (09/2013), Diabetes mellitus without complication (HCC), Diabetic neuropathy (HCC), Diabetic polyneuropathy (HCC), Dyslipidemia, ED (erectile dysfunction), GERD (gastroesophageal reflux disease), History of acute pyelonephritis (06/2013), Hypercholesteremia, Hyperlipidemia, Hypertension, Hypertensive heart disease, Inguinal hernia, Osteomyelitis (HCC), Osteomyelitis of left foot (HCC) (01/03/2019), Sciatica, Syncope and collapse, Tobacco use, Type 2 diabetes mellitus with diabetic neuropathy (HCC), Ulcer of left foot (HCC), Unspecified systolic (congestive) heart failure (HCC), and Wears dentures.  PSH:    Past Surgical History:  Procedure Laterality Date   AMPUTATION Left 01/04/2019   Procedure: AMPUTATION RAY LEFT GREAT TOE;  Surgeon: Angel Barba, DPM;  Location: [facility];  Service: Podiatry;  Laterality: Left;   CARDIAC CATHETERIZATION  11/14   [facility] x1   CATARACT EXTRACTION W/PHACO Left 01/03/2024   Procedure: CATARACT EXTRACTION PHACO AND INTRAOCULAR LENS PLACEMENT (IOC) LEFT DIABETIC  8.29  00:49.9;  Surgeon: Rosa College, MD;  Location: [facility];  Service: Ophthalmology;  Laterality: Left;   CATARACT EXTRACTION W/PHACO Right 01/17/2024   Procedure: CATARACT EXTRACTION PHACO AND INTRAOCULAR LENS PLACEMENT (IOC) RIGHT DIABETIC 7.23, 00:44.4;  Surgeon: Rosa College, MD;  Location: [facility];  Service: Ophthalmology;   Laterality: Right;   [facility]ONOSCOPY  2004   [facility]   COLONOSCOPY WITH PROPOFOL  N/A 06/22/2016   Procedure: COLONOSCOPY WITH PROPOFOL ;  Surgeon: Marshall Skeeter, MD;  Location: [facility];  Service: Endoscopy;  Laterality: N/A;   COLONOSCOPY WITH PROPOFOL  N/A 06/28/2019   Procedure: COLONOSCOPY WITH PROPOFOL ;  Surgeon: Selena Daily, MD;  Location: [facility];  Service: Endoscopy;  Laterality: N/A;  Diabetic - oral meds   ELBOW ARTHROSCOPY  2001   lt   ESOPHAGOGASTRODUODENOSCOPY (EGD) WITH PROPOFOL  N/A 06/28/2019   Procedure: ESOPHAGOGASTRODUODENOSCOPY (EGD) WITH PROPOFOL ;  Surgeon: Selena Daily, MD;  Location: [facility];  Service: Endoscopy;  Laterality: N/A;   GUM SURGERY     OLECRANON BURSECTOMY  11/12/2011   Procedure: OLECRANON BURSA;  Surgeon: Shellie Dials;  Location: [facility];  Service: Orthopedics;  Laterality: Right;  olecracnon bursectomy with delayed closure   POLYPECTOMY  06/28/2019   Procedure: POLYPECTOMY;  Surgeon: Selena Daily, MD;  Location: [facility];  Service: Endoscopy;;   WISDOM TOOTH EXTRACTION      Current Outpatient Medications  Medication Sig Dispense Refill   aspirin  81 MG tablet Take 81 mg by mouth daily. Take 1 tablet (81 mg) by mouth once daily     atorvastatin  (LIPITOR) 20 MG tablet TAKE 1 TABLET BY MOUTH EVERYDAY AT BEDTIME 90 tablet 1   CINNAMON PO Take 1,000 mg by mouth 2 (two) times daily. Reported on 05/06/2016     finasteride  (PROSCAR ) 5 MG tablet Take 1 tablet (5 mg total) by mouth daily. 90 tablet 1   gabapentin  (NEURONTIN ) 300 MG capsule TAKE 1 CAPSULE BY MOUTH EVERY DAY 90 capsule 0   lisinopril  (ZESTRIL ) 30 MG tablet Take 1 tablet (30 mg total) by mouth daily. 90 tablet 1   metFORMIN  (GLUCOPHAGE ) 500 MG tablet Take 1 tablet (500 mg total) by mouth 2 (two) times daily with a meal. 180 tablet 1   Multiple Vitamins-Minerals (PRESERVISION AREDS 2 PO) Take by mouth.     nitroGLYCERIN   (NITROSTAT ) 0.4 MG SL tablet PLACE 1 TABLET UNDER THE TONGUE EVERY 5 MINUTES AS NEEDED. 25 tablet 0   pantoprazole  (PROTONIX ) 40 MG tablet TAKE 1 TABLET BY MOUTH EVERY DAY 90 tablet 1   pyridoxine  (B-6) 100 MG tablet Take 100 mg by mouth daily.     tadalafil  (CIALIS ) 5 MG tablet Take 1 tablet (5 mg total) by mouth daily. Reported on 05/06/2016 30 tablet 0   traMADol  (ULTRAM ) 50 MG tablet Take 1 tablet (50 mg total) by mouth every 8 (eight) hours as needed for severe pain. 20 tablet 0   No current facility-administered medications for this visit.    Allergies:   Ibuprofen   Social History:  The patient  reports that he has been smoking cigarettes. He started smoking about 58 years ago. He has a 23.5 pack-year smoking history. He has never used smokeless tobacco. He reports current alcohol use. He reports that he does not currently use drugs.   Family History:   family history includes Cancer in his mother; Diabetes in his brother, father, mother, sister, sister, sister, sister, and sister; Heart disease in his father and mother; Hypertension in his father and mother; Stroke in his sister.   Review of Systems: Review of Systems  Constitutional: Negative.   Respiratory: Negative.    Cardiovascular: Negative.   Gastrointestinal: Negative.   Musculoskeletal: Negative.   Neurological: Negative.   Psychiatric/Behavioral:  Negative.    All other systems reviewed and are negative.   PHYSICAL EXAM: VS:  BP (!) 140/60 (BP Location: Left Arm, Patient Position: Sitting, Cuff Size: Normal)   Pulse 64   Ht 6' (1.829 m)   Wt 163 lb 4 oz (74 kg)   SpO2 96%   BMI 22.14 kg/m  , BMI Body mass index is 22.14 kg/m. Constitutional:  oriented to person, place, and time. No distress.  HENT:  Head: Grossly normal Eyes:  no discharge. No scleral icterus.  Neck: No JVD, no carotid bruits  Cardiovascular: Regular rate and rhythm, no murmurs appreciated Pulmonary/Chest: Clear to auscultation bilaterally,  no wheezes or rales Abdominal: Soft.  no distension.  no tenderness.  Musculoskeletal: Normal range of motion Neurological:  normal muscle tone. Coordination normal. No atrophy Skin: Skin warm and dry Psychiatric: normal affect, pleasant   Recent Labs: 02/18/2024: ALT 24; BUN 17; Creat 1.20; Hemoglobin 12.6; Platelets 225; Potassium 4.7; Sodium 138    Lipid Panel Lab Results  Component Value Date   CHOL 125 02/18/2024   HDL 43 02/18/2024   LDLCALC 52 02/18/2024   TRIG 251 (H) 02/18/2024     WEIGHT LOSS: Wt Readings from Last 3 Encounters:  03/24/24 163 lb 4 oz (74 kg)  02/18/24 162 lb 4.8 oz (73.6 kg)  01/17/24 166 lb (75.3 kg)     ASSESSMENT AND PLAN:  Hypertensive heart disease without heart failure Blood pressure elevated on today's visit, reports he just took his medicine, does not like driving and heavy traffic Well-controlled with primary care 1 month ago and in October 24 No changes made at this time, recommend close monitoring of his blood pressure at home  Coronary artery disease involving native coronary artery of native heart without angina pectoris Currently with no symptoms of angina. No further workup at this time. Continue current medication regimen.  Cholesterol and diabetes numbers relatively well-controlled Smoking cessation recommended  Mixed hyperlipidemia Cholesterol is at goal on the current lipid regimen. No changes to the medications were made.  Type 2 diabetes mellitus without complication, without long-term current use of insulin  (HCC) A1C 6.5, stable Active in the summer  Smoker Still smoking Previously did not want chantix Cessation recommended  S/P coronary artery stent placement No further testing   Signed, Juanda Noon, M.D., Ph.D. 03/24/2024  [facility] [place], [place] [phone]

## 2024-03-24 ENCOUNTER — Encounter: Payer: Self-pay | Admitting: Cardiovascular Disease

## 2024-03-24 ENCOUNTER — Ambulatory Visit: Attending: Cardiovascular Disease | Admitting: Cardiovascular Disease

## 2024-03-24 VITALS — BP 140/60 | HR 64 | Ht 72.0 in | Wt 163.2 lb

## 2024-03-24 DIAGNOSIS — E119 Type 2 diabetes mellitus without complications: Secondary | ICD-10-CM

## 2024-03-24 DIAGNOSIS — I25118 Atherosclerotic heart disease of native coronary artery with other forms of angina pectoris: Secondary | ICD-10-CM | POA: Diagnosis not present

## 2024-03-24 DIAGNOSIS — E1142 Type 2 diabetes mellitus with diabetic polyneuropathy: Secondary | ICD-10-CM | POA: Diagnosis not present

## 2024-03-24 DIAGNOSIS — F17200 Nicotine dependence, unspecified, uncomplicated: Secondary | ICD-10-CM | POA: Diagnosis not present

## 2024-03-24 DIAGNOSIS — E1165 Type 2 diabetes mellitus with hyperglycemia: Secondary | ICD-10-CM | POA: Diagnosis not present

## 2024-03-24 DIAGNOSIS — E782 Mixed hyperlipidemia: Secondary | ICD-10-CM

## 2024-03-24 DIAGNOSIS — I119 Hypertensive heart disease without heart failure: Secondary | ICD-10-CM

## 2024-03-24 MED ORDER — NITROGLYCERIN 0.4 MG SL SUBL: SUBLINGUAL_TABLET | SUBLINGUAL | 3 refills | Status: AC

## 2024-03-24 NOTE — Patient Instructions (Signed)

## 2024-03-30 ENCOUNTER — Ambulatory Visit

## 2024-03-30 DIAGNOSIS — Z0000 Encounter for general adult medical examination without abnormal findings: Secondary | ICD-10-CM | POA: Diagnosis not present

## 2024-03-30 DIAGNOSIS — Z1211 Encounter for screening for malignant neoplasm of colon: Secondary | ICD-10-CM

## 2024-03-30 NOTE — Progress Notes (Signed)
 Subjective:   Brian Dean is a 68 y.o. who presents for a Medicare Wellness preventive visit.  As a reminder, Annual Wellness Visits don't include a physical exam, and some assessments may be limited, especially if this visit is performed virtually. We may recommend an in-person follow-up visit with your provider if needed.  Visit Complete: Virtual I connected with  Brian Dean on 03/30/24 by a audio enabled telemedicine application and verified that I am speaking with the correct person using two identifiers.  Patient Location: Home  Provider Location: Office/Clinic  I discussed the limitations of evaluation and management by telemedicine. The patient expressed understanding and agreed to proceed.  Vital Signs: Because this visit was a virtual/telehealth visit, some criteria may be missing or patient reported. Any vitals not documented were not able to be obtained and vitals that have been documented are patient reported.  VideoDeclined- This patient declined Librarian, academic. Therefore the visit was completed with audio only.  Persons Participating in Visit: Patient.  AWV Questionnaire: No: Patient Medicare AWV questionnaire was not completed prior to this visit.  Cardiac Risk Factors include: advanced age (>46men, >100 women);diabetes mellitus;hypertension;male gender;dyslipidemia;smoking/ tobacco exposure     Objective:     There were no vitals filed for this visit. There is no height or weight on file to calculate BMI.     03/30/2024    4:02 PM 01/17/2024    9:14 AM 01/03/2024    6:43 AM 03/26/2023    1:38 PM 06/28/2019    7:40 AM 01/03/2019    5:13 PM 01/03/2019    1:50 PM  Advanced Directives  Does Patient Have a Medical Advance Directive? No No No No No No No  Would patient like information on creating a medical advance directive? No - Patient declined No - Patient declined No - Patient declined  No - Patient declined No - Patient  declined No - Patient declined    Current Medications (verified) Outpatient Encounter Medications as of 03/30/2024  Medication Sig   aspirin  81 MG tablet Take 81 mg by mouth daily. Take 1 tablet (81 mg) by mouth once daily   atorvastatin  (LIPITOR) 20 MG tablet TAKE 1 TABLET BY MOUTH EVERYDAY AT BEDTIME   CINNAMON PO Take 1,000 mg by mouth 2 (two) times daily. Reported on 05/06/2016   finasteride  (PROSCAR ) 5 MG tablet Take 1 tablet (5 mg total) by mouth daily.   gabapentin  (NEURONTIN ) 300 MG capsule TAKE 1 CAPSULE BY MOUTH EVERY DAY   lisinopril  (ZESTRIL ) 30 MG tablet Take 1 tablet (30 mg total) by mouth daily.   metFORMIN  (GLUCOPHAGE ) 500 MG tablet Take 1 tablet (500 mg total) by mouth 2 (two) times daily with a meal.   Multiple Vitamins-Minerals (PRESERVISION AREDS 2 PO) Take by mouth.   nitroGLYCERIN  (NITROSTAT ) 0.4 MG SL tablet PLACE 1 TABLET UNDER THE TONGUE EVERY 5 MINUTES AS NEEDED.   pantoprazole  (PROTONIX ) 40 MG tablet TAKE 1 TABLET BY MOUTH EVERY DAY   pyridoxine  (B-6) 100 MG tablet Take 100 mg by mouth daily.   traMADol  (ULTRAM ) 50 MG tablet Take 1 tablet (50 mg total) by mouth every 8 (eight) hours as needed for severe pain.   tadalafil  (CIALIS ) 5 MG tablet Take 1 tablet (5 mg total) by mouth daily. Reported on 05/06/2016 (Patient not taking: Reported on 03/30/2024)   No facility-administered encounter medications on file as of 03/30/2024.    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 coli pansensitive   Hypercholesteremia    Hyperlipidemia    Hypertension    Hypertensive heart disease    Inguinal hernia    Osteomyelitis (HCC)    Osteomyelitis of left foot (HCC) 01/03/2019   Sciatica    Syncope and collapse    Tobacco use    Type 2 diabetes mellitus with diabetic neuropathy (HCC)    Ulcer of left foot (HCC)    Unspecified systolic (congestive) heart failure (HCC)    Wears dentures    partial upper and lower   Past Surgical History:  Procedure Laterality Date   AMPUTATION Left 01/04/2019   Procedure: AMPUTATION RAY LEFT GREAT TOE;  Surgeon: Angel Barba, DPM;  Location: [facility];  Service: Podiatry;  Laterality: Left;   CARDIAC CATHETERIZATION  11/14   [facility] x1   CATARACT EXTRACTION W/PHACO Left 01/03/2024   Procedure: CATARACT EXTRACTION PHACO AND INTRAOCULAR LENS PLACEMENT (IOC) LEFT DIABETIC  8.29  00:49.9;  Surgeon: Rosa College, MD;  Location: [facility];  Service: Ophthalmology;  Laterality: Left;   CATARACT EXTRACTION W/PHACO Right 01/17/2024   Procedure: CATARACT EXTRACTION PHACO AND INTRAOCULAR LENS PLACEMENT (IOC) RIGHT DIABETIC 7.23, 00:44.4;  Surgeon: Rosa College, MD;  Location: [facility];  Service: Ophthalmology;  Laterality: Right;   COLONOSCOPY  2004   [facility]   COLONOSCOPY WITH PROPOFOL  N/A 06/22/2016   Procedure: COLONOSCOPY WITH PROPOFOL ;  Surgeon: Marshall Skeeter, MD;  Location: [facility];  Service: Endoscopy;  Laterality: N/A;   COLONOSCOPY WITH PROPOFOL  N/A 06/28/2019   Procedure: COLONOSCOPY WITH PROPOFOL ;  Surgeon: Selena Daily, MD;  Location: [facility];  Service: Endoscopy;  Laterality: N/A;  Diabetic - oral meds   ELBOW ARTHROSCOPY  2001   lt   ESOPHAGOGASTRODUODENOSCOPY (EGD) WITH PROPOFOL  N/A 06/28/2019   Procedure: ESOPHAGOGASTRODUODENOSCOPY (EGD) WITH PROPOFOL ;  Surgeon: Selena Daily, MD;  Location: [facility];  Service: Endoscopy;  Laterality: N/A;   GUM SURGERY      OLECRANON BURSECTOMY  11/12/2011   Procedure: OLECRANON BURSA;  Surgeon: Shellie Dials;  Location: [facility];  Service: Orthopedics;  Laterality: Right;  olecracnon bursectomy with delayed closure   POLYPECTOMY  06/28/2019   Procedure: POLYPECTOMY;  Surgeon: Selena Daily, MD;  Location: [facility];  Service: Endoscopy;;   WISDOM TOOTH EXTRACTION     Family History  Problem Relation Age of Onset   Hypertension Father    Diabetes Father    Heart disease Father    Diabetes Mother    Heart disease Mother    Hypertension Mother    Cancer Mother        bladder   Diabetes Sister    Stroke Sister        mini stroke   Diabetes Brother    Diabetes Sister    Diabetes Sister    Diabetes Sister    Diabetes Sister    Social History   Socioeconomic History   Marital status: Married  Spouse name: Not on file   Number of children: Not on file   Years of education: Not on file   Highest education level: Not on file  Occupational History   Not on file  Tobacco Use   Smoking status: Every Day    Current packs/day: 0.00    Average packs/day: 0.5 packs/day for 47.0 years (23.5 ttl pk-yrs)    Types: Cigarettes    Start date: 11/09/1965    Last attempt to quit: 11/09/2012    Years since quitting: 11.3   Smokeless tobacco: Never   Tobacco comments:    down to 5 cigs/day  Vaping Use   Vaping status: Never Used  Substance and Sexual Activity   Alcohol use: Yes    Alcohol/week: 0.0 standard drinks of alcohol    Comment: occasional (3-4x/yr)   Drug use: Not Currently   Sexual activity: Yes    Birth control/protection: None  Other Topics Concern   Not on file  Social History Narrative   Not on file   Social Drivers of Health   Financial Resource Strain: Low Risk  (03/30/2024)   Overall Financial Resource Strain (CARDIA)    Difficulty of Paying Living Expenses: Not hard at all  Food Insecurity: No Food Insecurity (03/30/2024)   Hunger Vital Sign     Worried About Running Out of Food in the Last Year: Never true    Ran Out of Food in the Last Year: Never true  Transportation Needs: No Transportation Needs (03/30/2024)   PRAPARE - Administrator, Civil Service (Medical): No    Lack of Transportation (Non-Medical): No  Physical Activity: Insufficiently Active (03/30/2024)   Exercise Vital Sign    Days of Exercise per Week: 3 days    Minutes of Exercise per Session: 30 min  Stress: No Stress Concern Present (03/30/2024)   [employer] of Occupational Health - Occupational Stress Questionnaire    Feeling of Stress : Not at all  Social Connections: Moderately Isolated (03/30/2024)   Social Connection and Isolation Panel [NHANES]    Frequency of Communication with Friends and Family: More than three times a week    Frequency of Social Gatherings with Friends and Family: Once a week    Attends Religious Services: Never    Database administrator or Organizations: No    Attends Engineer, structural: Never    Marital Status: Married    Tobacco Counseling Ready to quit: Not Answered Counseling given: Not Answered Tobacco comments: down to 5 cigs/day    Clinical Intake:  Pre-visit preparation completed: Yes  Pain : No/denies pain     BMI - recorded: 22.1 Nutritional Status: BMI of 19-24  Normal Nutritional Risks: None Diabetes: Yes CBG done?: No Did pt. bring in CBG monitor from home?: No  Lab Results  Component Value Date   HGBA1C 6.5 (H) 02/18/2024   HGBA1C 6.0 (A) 08/20/2023   HGBA1C 6.2 (H) 02/18/2023     How often do you need to have someone help you when you read instructions, pamphlets, or other written materials from your doctor or pharmacy?: 1 - Never  Interpreter Needed?: No  Information entered by :: Dellie Fergusson, LPN   Activities of Daily Living     03/30/2024    4:04 PM 02/18/2024    1:06 PM  In your present state of health, do you have any difficulty performing the following  activities:  Hearing? 0 0  Vision? 0 0  Difficulty concentrating or  making decisions? 0 0  Walking or climbing stairs? 0 0  Dressing or bathing? 0 0  Doing errands, shopping? 0 0  Preparing Food and eating ? N   Using the Toilet? N   In the past six months, have you accidently leaked urine? N   Do you have problems with loss of bowel control? N   Managing your Medications? N   Managing your Finances? N   Housekeeping or managing your Housekeeping? N     Patient Care Team: Rockney Cid, DO as PCP - General (Internal Medicine) Marquita Situ, Magali Schmitz, MD (General Surgery) Lark Plum, MD as Attending Physician (Family Medicine) Devorah Fonder, MD as Consulting Physician (Cardiology) Rosa College, MD as Consulting Physician (Ophthalmology)  Indicate any recent Medical Services you may have received from other than [facility] providers in the past year (date may be approximate).     Assessment:    This is a routine wellness examination for Brian Dean.  Hearing/Vision screen Hearing Screening - Comments:: NO AIDS Vision Screening - Comments:: READERS- DR.B.KING   Goals Addressed             This Visit's Progress    DIET - EAT MORE FRUITS AND VEGETABLES         Depression Screen     03/30/2024    4:00 PM 02/18/2024    1:06 PM 08/20/2023    1:08 PM 03/26/2023    1:36 PM 02/18/2023    1:10 PM 12/08/2022   10:38 AM 08/18/2022   11:08 AM  PHQ 2/9 Scores  PHQ - 2 Score 0 0 0 0 0 0 0  PHQ- 9 Score 0  0  0 0 0    Fall Risk     03/30/2024    4:03 PM 02/18/2024    1:06 PM 08/20/2023    1:08 PM 03/26/2023    1:34 PM 02/18/2023    1:08 PM  Fall Risk   Falls in the past year? 0 0 0 0 0  Number falls in past yr: 0 0 0 0 0  Injury with Fall? 0 0 0 0 0  Risk for fall due to : No Fall Risks No Fall Risks  No Fall Risks   Follow up Falls evaluation completed Falls evaluation completed  Education provided;Falls prevention discussed     MEDICARE RISK AT HOME:  Medicare  Risk at Home Any stairs in or around the home?: Yes If so, are there any without handrails?: No Home free of loose throw rugs in walkways, pet beds, electrical cords, etc?: Yes Adequate lighting in your home to reduce risk of falls?: Yes Life alert?: No Use of a cane, walker or w/c?: No Grab bars in the bathroom?: No Shower chair or bench in shower?: No Elevated toilet seat or a handicapped toilet?: No  TIMED UP AND GO:  Was the test performed?  No  Cognitive Function: 6CIT completed        03/30/2024    4:05 PM 03/26/2023    1:46 PM  6CIT Screen  What Year? 0 points 0 points  What month? 0 points 0 points  What time? 0 points 0 points  Count back from 20 0 points 0 points  Months in reverse 0 points 0 points  Repeat phrase 0 points 0 points  Total Score 0 points 0 points    Immunizations Immunization History  Administered Date(s) Administered   Fluad Quad(high Dose 65+) 11/18/2021, 08/18/2022   Fluad Trivalent(High Dose 65+)  08/20/2023   Influenza Inj Mdck Quad Pf 11/20/2019   Influenza,inj,Quad PF,6+ Mos 10/25/2015, 10/27/2017, 10/28/2018   PNEUMOCOCCAL CONJUGATE-20 08/18/2022   Pneumococcal Polysaccharide-23 09/30/2010   Td 09/30/2010   Tdap 01/03/2019    Screening Tests Health Maintenance  Topic Date Due   Zoster Vaccines- Shingrix (1 of 2) Never done   Lung Cancer Screening  Never done   OPHTHALMOLOGY EXAM  03/11/2024   Colonoscopy  06/27/2024   INFLUENZA VACCINE  06/09/2024   Diabetic kidney evaluation - Urine ACR  08/19/2024   HEMOGLOBIN A1C  08/19/2024   Diabetic kidney evaluation - eGFR measurement  02/17/2025   FOOT EXAM  02/17/2025   Medicare Annual Wellness (AWV)  03/30/2025   DTaP/Tdap/Td (3 - Td or Tdap) 01/03/2029   Pneumonia Vaccine 74+ Years old  Completed   Hepatitis C Screening  Completed   HPV VACCINES  Aged Out   Meningococcal B Vaccine  Aged Out   COVID-19 Vaccine  Discontinued    Health Maintenance  Health Maintenance Due   Topic Date Due   Zoster Vaccines- Shingrix (1 of 2) Never done   Lung Cancer Screening  Never done   OPHTHALMOLOGY EXAM  03/11/2024   Colonoscopy  06/27/2024   Health Maintenance Items Addressed: Referral sent to GI for colonoscopy; DECLINES LUNG CA SCREENING; UP TO DATE ON SHOTS, DECLINES SHINGRIX & COVID  Additional Screening:  Vision Screening: Recommended annual ophthalmology exams for early detection of glaucoma and other disorders of the eye.  Dental Screening: Recommended annual dental exams for proper oral hygiene  Community Resource Referral / Chronic Care Management: CRR required this visit?  No   CCM required this visit?  No   Plan:    I have personally reviewed and noted the following in the patient's chart:   Medical and social history Use of alcohol, tobacco or illicit drugs  Current medications and supplements including opioid prescriptions. Patient is not currently taking opioid prescriptions. Functional ability and status Nutritional status Physical activity Advanced directives List of other physicians Hospitalizations, surgeries, and ER visits in previous 12 months Vitals Screenings to include cognitive, depression, and falls Referrals and appointments  In addition, I have reviewed and discussed with patient certain preventive protocols, quality metrics, and best practice recommendations. A written personalized care plan for preventive services as well as general preventive health recommendations were provided to patient.   Pinky Bright, LPN   02/15/8118   After Visit Summary: (MyChart) Due to this being a telephonic visit, the after visit summary with patients personalized plan was offered to patient via MyChart   Notes: REFERRAL SENT FOR COLONOSCOPY FAXED REQUEST FOR EYE EXAM RESULTS

## 2024-03-30 NOTE — Patient Instructions (Signed)
 [name] , Thank you for taking time out of your busy schedule to complete your Annual Wellness Visit with me. I enjoyed our conversation and look forward to speaking with you again next year. I, as well as your care team,  appreciate your ongoing commitment to your health goals. Please review the following plan we discussed and let me know if I can assist you in the future.   Referrals: REFERRAL SENT FOR COLONOSCOPY- MAKE SURE YOU DON'T SCHEDULE IT BEFORE 06/28/24  Follow up Visits: Next Medicare AWV with our clinical staff: 04/19/25 @ 12:40 PM BY PHONE   Have you seen your provider in the last 6 months (3 months if uncontrolled diabetes)? Yes   Clinician Recommendations:  Aim for 30 minutes of exercise or brisk walking, 6-8 glasses of water , and 5 servings of fruits and vegetables each day. TAKE CARE & KEEP PRACTICING THAT GUITAR!!      This is a list of the screening recommended for you and due dates:  Health Maintenance  Topic Date Due   Zoster (Shingles) Vaccine (1 of 2) Never done   Screening for Lung Cancer  Never done   Eye exam for diabetics  03/11/2024   Colon Cancer Screening  06/27/2024   Flu Shot  06/09/2024   Yearly kidney health urinalysis for diabetes  08/19/2024   Hemoglobin A1C  08/19/2024   Yearly kidney function blood test for diabetes  02/17/2025   Complete foot exam   02/17/2025   Medicare Annual Wellness Visit  03/30/2025   DTaP/Tdap/Td vaccine (3 - Td or Tdap) 01/03/2029   Pneumonia Vaccine  Completed   Hepatitis C Screening  Completed   HPV Vaccine  Aged Out   Meningitis B Vaccine  Aged Out   COVID-19 Vaccine  Discontinued    Advanced directives: (ACP Link)Information on Advanced Care Planning can be found at [place]  Secretary of [facility] Advance Health Care Directives Advance Health Care Directives. [email or web address]  Advance Care Planning is important because it:  [x]  Makes sure you receive the medical care that is consistent with your values, goals, and  preferences  [x]  It provides guidance to your family and loved ones and reduces their decisional burden about whether or not they are making the right decisions based on your wishes.  Follow the link provided in your after visit summary or read over the paperwork we have mailed to you to help you started getting your Advance Directives in place. If you need assistance in completing these, please reach out to us  so that we can help you!

## 2024-04-12 ENCOUNTER — Telehealth: Payer: Self-pay

## 2024-04-12 ENCOUNTER — Other Ambulatory Visit: Payer: Self-pay

## 2024-04-12 DIAGNOSIS — Z860100 Personal history of colon polyps, unspecified: Secondary | ICD-10-CM

## 2024-04-12 NOTE — Telephone Encounter (Signed)
 Gastroenterology Pre-Procedure Review  Request Date: 06/28/24 Requesting Physician: Dr. Cornel Diesel  PATIENT REVIEW QUESTIONS: The patient responded to the following health history questions as indicated:    1. Are you having any GI issues? no 2. Do you have a personal history of Polyps? yes (last colonoscopy performed by Dr. Baldomero Bone 06/28/19 recommended repeat in 5 years) 3. Do you have a family history of Colon Cancer or Polyps? no 4. Diabetes Mellitus? yes (pt takes Metformin .  Noted on instructions to stop 2 days prior) 5. Joint replacements in the past 12 months?no 6. Major health problems in the past 3 months?no 7. Any artificial heart valves, MVP, or defibrillator?No devices, however cardiac history CHF clearance sent to Cardiology preop Dr. Jerelene Monday    MEDICATIONS & ALLERGIES:    Patient reports the following regarding taking any anticoagulation/antiplatelet therapy:   Plavix , Coumadin, Eliquis, Xarelto, Lovenox , Pradaxa, Brilinta, or Effient? no Aspirin ? Yes aspirin  81  Patient confirms/reports the following medications:  Current Outpatient Medications  Medication Sig Dispense Refill   aspirin  81 MG tablet Take 81 mg by mouth daily. Take 1 tablet (81 mg) by mouth once daily     atorvastatin  (LIPITOR) 20 MG tablet TAKE 1 TABLET BY MOUTH EVERYDAY AT BEDTIME 90 tablet 1   CINNAMON PO Take 1,000 mg by mouth 2 (two) times daily. Reported on 05/06/2016     finasteride  (PROSCAR ) 5 MG tablet Take 1 tablet (5 mg total) by mouth daily. 90 tablet 1   gabapentin  (NEURONTIN ) 300 MG capsule TAKE 1 CAPSULE BY MOUTH EVERY DAY 90 capsule 0   lisinopril  (ZESTRIL ) 30 MG tablet Take 1 tablet (30 mg total) by mouth daily. 90 tablet 1   metFORMIN  (GLUCOPHAGE ) 500 MG tablet Take 1 tablet (500 mg total) by mouth 2 (two) times daily with a meal. 180 tablet 1   Multiple Vitamins-Minerals (PRESERVISION AREDS 2 PO) Take by mouth.     nitroGLYCERIN  (NITROSTAT ) 0.4 MG SL tablet PLACE 1 TABLET UNDER THE TONGUE EVERY  5 MINUTES AS NEEDED. 25 tablet 3   pantoprazole  (PROTONIX ) 40 MG tablet TAKE 1 TABLET BY MOUTH EVERY DAY 90 tablet 1   pyridoxine  (B-6) 100 MG tablet Take 100 mg by mouth daily.     tadalafil  (CIALIS ) 5 MG tablet Take 1 tablet (5 mg total) by mouth daily. Reported on 05/06/2016 (Patient not taking: Reported on 03/30/2024) 30 tablet 0   traMADol  (ULTRAM ) 50 MG tablet Take 1 tablet (50 mg total) by mouth every 8 (eight) hours as needed for severe pain. 20 tablet 0   No current facility-administered medications for this visit.    Patient confirms/reports the following allergies:  Allergies  Allergen Reactions   Ibuprofen Diarrhea    No orders of the defined types were placed in this encounter.   AUTHORIZATION INFORMATION Primary Insurance: 1D#: Group #:  Secondary Insurance: 1D#: Group #:  SCHEDULE INFORMATION: Date: 06/28/24 Time: Location: [facility]

## 2024-05-10 ENCOUNTER — Telehealth: Payer: Self-pay | Admitting: Cardiovascular Disease

## 2024-05-10 NOTE — Telephone Encounter (Signed)
   Pre-operative Risk Assessment    Patient Name: Brian Dean  DOB: 1956/06/07 MRN: [id number]   Date of last office visit: 03/24/24 Date of next office visit: n/a   Request for Surgical Clearance    Procedure:  Colonoscopy  Date of Surgery:  Clearance 06/28/24                                Surgeon:  Dr. Jayson Endow Surgeon's Group or Practice Name:  [facility] Phone number:  [phone] Fax number:  [phone]   Type of Clearance Requested:   - Medical    Type of Anesthesia:  General    Additional requests/questions:    [name] NOVAK [employer]   05/10/2024, 3:55 PM

## 2024-05-10 NOTE — Telephone Encounter (Signed)
   Pre-operative Risk Assessment    Patient Name: Brian Dean  DOB: 1956/09/09 MRN: [id number]   Date of last office visit: unknown Date of next office visit: unknown   Request for Surgical Clearance    Procedure:  colonoscopy  Date of Surgery:  Clearance 06/10/24                                Surgeon:  Dr. Jayson endow Surgeon's Group or Practice Name:  [facility] Phone number:  [phone] Fax number:  [phone]   Type of Clearance Requested:   - Medical    Type of Anesthesia:  General    Additional requests/questions:    SignedBerwyn LELON Sprung   05/10/2024, 4:05 PM

## 2024-05-17 ENCOUNTER — Telehealth: Payer: Self-pay

## 2024-05-17 ENCOUNTER — Other Ambulatory Visit: Payer: Self-pay | Admitting: Internal Medicine

## 2024-05-17 DIAGNOSIS — K219 Gastro-esophageal reflux disease without esophagitis: Secondary | ICD-10-CM

## 2024-05-17 DIAGNOSIS — E1142 Type 2 diabetes mellitus with diabetic polyneuropathy: Secondary | ICD-10-CM

## 2024-05-17 NOTE — Telephone Encounter (Signed)
 Med Rec and Consent done     Patient Consent for Virtual Visit        Brian Dean has provided verbal consent on 05/17/2024 for a virtual visit (video or telephone).   CONSENT FOR VIRTUAL VISIT FOR:  Brian Dean  By participating in this virtual visit I agree to the following:  I hereby voluntarily request, consent and authorize [facility] and its employed or contracted physicians, physician assistants, nurse practitioners or other licensed health care professionals (the Practitioner), to provide me with telemedicine health care services (the "Services) as deemed necessary by the treating Practitioner. I acknowledge and consent to receive the Services by the Practitioner via telemedicine. I understand that the telemedicine visit will involve communicating with the Practitioner through live audiovisual communication technology and the disclosure of certain medical information by electronic transmission. I acknowledge that I have been given the opportunity to request an in-person assessment or other available alternative prior to the telemedicine visit and am voluntarily participating in the telemedicine visit.  I understand that I have the right to withhold or withdraw my consent to the use of telemedicine in the course of my care at any time, without affecting my right to future care or treatment, and that the Practitioner or I may terminate the telemedicine visit at any time. I understand that I have the right to inspect all information obtained and/or recorded in the course of the telemedicine visit and may receive copies of available information for a reasonable fee.  I understand that some of the potential risks of receiving the Services via telemedicine include:  Delay or interruption in medical evaluation due to technological equipment failure or disruption; Information transmitted may not be sufficient (e.g. poor resolution of images) to allow for appropriate medical  decision making by the Practitioner; and/or  In rare instances, security protocols could fail, causing a breach of personal health information.  Furthermore, I acknowledge that it is my responsibility to provide information about my medical history, conditions and care that is complete and accurate to the best of my ability. I acknowledge that Practitioner's advice, recommendations, and/or decision may be based on factors not within their control, such as incomplete or inaccurate data provided by me or distortions of diagnostic images or specimens that may result from electronic transmissions. I understand that the practice of medicine is not an exact science and that Practitioner makes no warranties or guarantees regarding treatment outcomes. I acknowledge that a copy of this consent can be made available to me via my patient portal [facility] MyChart), or I can request a printed copy by calling the office of [facility].    I understand that my insurance will be billed for this visit.   I have read or had this consent read to me. I understand the contents of this consent, which adequately explains the benefits and risks of the Services being provided via telemedicine.  I have been provided ample opportunity to ask questions regarding this consent and the Services and have had my questions answered to my satisfaction. I give my informed consent for the services to be provided through the use of telemedicine in my medical care

## 2024-05-17 NOTE — Telephone Encounter (Signed)
   Name: Brian Dean  DOB: Jun 19, 1956  MRN: [id number]  Primary Cardiologist:    Preoperative team, please contact this patient and set up a phone call appointment for further preoperative risk assessment. Please obtain consent and complete medication review. Thank you for your help. Saw Dr. Gollan last on 03/24/2024.  I confirm that guidance regarding antiplatelet and oral anticoagulation therapy has been completed and, if necessary, noted below.  Per office protocol he may hold aspirin  for 7 days prior to procedure. Please resume aspirin  as soon as possible postprocedure, at the discretion of the surgeon.    I also confirmed the patient resides in the state of [place] . As per [facility] Medical Board telemedicine laws, the patient must reside in the state in which the provider is licensed.   Lamarr Satterfield, NP 05/17/2024, 11:28 AM [facility]

## 2024-05-17 NOTE — Telephone Encounter (Signed)
 S/W pt and scheduled TELE Preop appt 05/22/24. Med Rec and Consent done

## 2024-05-19 NOTE — Telephone Encounter (Signed)
 Requested Prescriptions  Pending Prescriptions Disp Refills   gabapentin  (NEURONTIN ) 300 MG capsule [Pharmacy Med Name: GABAPENTIN  300 MG CAPSULE] 90 capsule 1    Sig: TAKE 1 CAPSULE BY MOUTH EVERY DAY     Neurology: Anticonvulsants - gabapentin  Passed - 05/19/2024 12:31 PM      Passed - Cr in normal range and within 360 days    Creat  Date Value Ref Range Status  02/18/2024 1.20 0.70 - 1.35 mg/dL Final   Creatinine, Urine  Date Value Ref Range Status  08/20/2023 100 20 - 320 mg/dL Final         Passed - Completed PHQ-2 or PHQ-9 in the last 360 days      Passed - Valid encounter within last 12 months    Recent Outpatient Visits           3 months ago Controlled type 2 diabetes mellitus with diabetic polyneuropathy, without long-term current use of insulin  [facility])   [facility] Bernardo Fend, DO               pantoprazole  (PROTONIX ) 40 MG tablet [Pharmacy Med Name: PANTOPRAZOLE  SOD DR 40 MG TAB] 90 tablet 1    Sig: TAKE 1 TABLET BY MOUTH EVERY DAY     Gastroenterology: Proton Pump Inhibitors Passed - 05/19/2024 12:31 PM      Passed - Valid encounter within last 12 months    Recent Outpatient Visits           3 months ago Controlled type 2 diabetes mellitus with diabetic polyneuropathy, without long-term current use of insulin  [facility])   [facility] Bernardo Fend, [place]

## 2024-05-22 ENCOUNTER — Ambulatory Visit: Attending: Cardiovascular Disease

## 2024-05-22 DIAGNOSIS — Z01810 Encounter for preprocedural cardiovascular examination: Secondary | ICD-10-CM | POA: Diagnosis not present

## 2024-05-22 MED ORDER — SUPREP BOWEL PREP KIT 17.5-3.13-1.6 GM/177ML PO SOLN: 1.0000 | Freq: Once | ORAL | 0 refills | Status: AC

## 2024-05-22 NOTE — Telephone Encounter (Signed)
 Received cardiology clearance from Katlyn West NP -patient is at low risk for perioperative complications.Patient may proceed to surgery at acceptable risk.No further cardiovascular testing is required. Patient should remain. on ASA without interruption.

## 2024-05-22 NOTE — Progress Notes (Signed)
 Virtual Visit via Telephone Note   Because of [name] co-morbid illnesses, he is at least at moderate risk for complications without adequate follow up.  This format is felt to be most appropriate for this patient at this time.  Due to technical limitations with video connection (technology), today's appointment will be conducted as an audio only telehealth visit, and Brian Dean verbally agreed to proceed in this manner.   All issues noted in this document were discussed and addressed.  No physical exam could be performed with this format.  Evaluation Performed:  Preoperative cardiovascular risk assessment _____________   Date:  05/22/2024   Patient ID:  Brian Dean, DOB [birth year], MRN [id number] Patient Location:  Home Provider location:   Office  Primary Care Provider:  Bernardo Fend, DO Primary Cardiologist:  None  Chief Complaint / Patient Profile  68 y.o. y/o male with a h/o CAD, type 2 diabetes mellitus, hypertension, tobacco use, hyperlipidemia who is pending colonoscopy and presents today for telephonic preoperative cardiovascular risk assessment. History of Present Illness  Brian Dean is a 68 y.o. male who presents via audio/video conferencing for a telehealth visit today.  Pt was last seen in cardiology clinic on 03/24/2024 by Dr. Gollan.  At that time Brian Dean was doing well.  The patient is now pending procedure as outlined above. Since his last visit, he has remained stable from a cardiac standpoint. Today he denies chest pain, shortness of breath, lower extremity edema, fatigue, palpitations, melena, hematuria, hemoptysis, diaphoresis, weakness, presyncope, syncope, orthopnea, and PND. Patient is able to achieve 4 METs of activity with walking, household chores and yardwork. Prior to call patient reports he was working outside digging for a leaking pipe and tolerating well.  Past Medical History    Past Medical History:  Diagnosis Date    Anemia    Arthritis    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)    CAD (coronary artery disease) 09/2013   diffuse, aggresive risk factor modification recommended   Diabetes mellitus without complication (HCC)    Diabetic neuropathy (HCC)    Diabetic polyneuropathy (HCC)    Dyslipidemia    ED (erectile dysfunction)    GERD (gastroesophageal reflux disease)    History of acute pyelonephritis 06/2013   hospitalized; 100k E. coli pansensitive   Hypercholesteremia    Hyperlipidemia    Hypertension    Hypertensive heart disease    Inguinal hernia    Osteomyelitis (HCC)    Osteomyelitis of left foot (HCC) 01/03/2019   Sciatica    Syncope and collapse    Tobacco use    Type 2 diabetes mellitus with diabetic neuropathy (HCC)    Ulcer of left foot (HCC)    Unspecified systolic (congestive) heart failure (HCC)    Wears dentures    partial upper and lower   Past Surgical History:  Procedure Laterality Date   AMPUTATION Left 01/04/2019   Procedure: AMPUTATION RAY LEFT GREAT TOE;  Surgeon: Neill Boas, DPM;  Location: [facility];  Service: Podiatry;  Laterality: Left;   CARDIAC CATHETERIZATION  11/14   [facility] x1   CATARACT EXTRACTION W/PHACO Left 01/03/2024   Procedure: CATARACT EXTRACTION PHACO AND INTRAOCULAR LENS PLACEMENT (IOC) LEFT DIABETIC  8.29  00:49.9;  Surgeon: Myrna Adine Anes, MD;  Location: [facility];  Service: Ophthalmology;  Laterality: Left;   CATARACT EXTRACTION W/PHACO Right 01/17/2024   Procedure: CATARACT EXTRACTION PHACO AND INTRAOCULAR LENS PLACEMENT (IOC) RIGHT  DIABETIC 7.23, 00:44.4;  Surgeon: Myrna Adine Anes, MD;  Location: [facility];  Service: Ophthalmology;  Laterality: Right;   COLONOSCOPY  2004   unc   COLONOSCOPY WITH PROPOFOL  N/A 06/22/2016   Procedure: COLONOSCOPY WITH PROPOFOL ;  Surgeon: Reyes LELON Cota, MD;  Location: [facility];  Service: Endoscopy;  Laterality: N/A;   COLONOSCOPY WITH PROPOFOL   N/A 06/28/2019   Procedure: COLONOSCOPY WITH PROPOFOL ;  Surgeon: Unk Corinn Skiff, MD;  Location: [facility];  Service: Endoscopy;  Laterality: N/A;  Diabetic - oral meds   ELBOW ARTHROSCOPY  2001   lt   ESOPHAGOGASTRODUODENOSCOPY (EGD) WITH PROPOFOL  N/A 06/28/2019   Procedure: ESOPHAGOGASTRODUODENOSCOPY (EGD) WITH PROPOFOL ;  Surgeon: Unk Corinn Skiff, MD;  Location: [facility];  Service: Endoscopy;  Laterality: N/A;   GUM SURGERY     OLECRANON BURSECTOMY  11/12/2011   Procedure: OLECRANON BURSA;  Surgeon: Prentice LELON Pagan;  Location: [facility];  Service: Orthopedics;  Laterality: Right;  olecracnon bursectomy with delayed closure   POLYPECTOMY  06/28/2019   Procedure: POLYPECTOMY;  Surgeon: Unk Corinn Skiff, MD;  Location: [facility];  Service: Endoscopy;;   WISDOM TOOTH EXTRACTION     Allergies Allergies  Allergen Reactions   Ibuprofen Diarrhea   Home Medications    Prior to Admission medications   Medication Sig Start Date End Date Taking? Authorizing Provider  aspirin  81 MG tablet Take 81 mg by mouth daily. Take 1 tablet (81 mg) by mouth once daily    [provider]  atorvastatin  (LIPITOR) 20 MG tablet TAKE 1 TABLET BY MOUTH EVERYDAY AT BEDTIME 02/18/24   Bernardo Fend, DO  CINNAMON PO Take 1,000 mg by mouth 2 (two) times daily. Reported on 05/06/2016    [provider]  finasteride  (PROSCAR ) 5 MG tablet Take 1 tablet (5 mg total) by mouth daily. 02/18/24   Bernardo Fend, DO  gabapentin  (NEURONTIN ) 300 MG capsule TAKE 1 CAPSULE BY MOUTH EVERY DAY 05/19/24   Bernardo Fend, DO  lisinopril  (ZESTRIL ) 30 MG tablet Take 1 tablet (30 mg total) by mouth daily. 02/18/24   Bernardo Fend, DO  metFORMIN  (GLUCOPHAGE ) 500 MG tablet Take 1 tablet (500 mg total) by mouth 2 (two) times daily with a meal. 02/18/24   Bernardo Fend, DO  Multiple Vitamins-Minerals (PRESERVISION AREDS 2 PO) Take by mouth.    [provider]  nitroGLYCERIN  (NITROSTAT ) 0.4 MG SL tablet PLACE 1 TABLET UNDER THE TONGUE EVERY 5 MINUTES AS NEEDED. 03/24/24   Gollan, Timothy J, MD  pantoprazole  (PROTONIX ) 40 MG tablet TAKE 1 TABLET BY MOUTH EVERY DAY 05/19/24   Bernardo Fend, DO  pyridoxine  (B-6) 100 MG tablet Take 100 mg by mouth daily.    [provider]  tadalafil  (CIALIS ) 5 MG tablet Take 1 tablet (5 mg total) by mouth daily. Reported on 05/06/2016 Patient not taking: Reported on 05/17/2024 08/20/23   Bernardo Fend, DO  traMADol  (ULTRAM ) 50 MG tablet Take 1 tablet (50 mg total) by mouth every 8 (eight) hours as needed for severe pain. 08/20/23   Bernardo Fend, DO    Physical Exam    Vital Signs:  Brian Dean does not have vital signs available for review today. Given telephonic nature of communication, physical exam is limited. AAOx3. NAD. Normal affect.  Speech and respirations are unlabored.  Accessory Clinical Findings  None Assessment & Plan    1.  Preoperative Cardiovascular Risk Assessment: Brian Dean perioperative risk of a major cardiac event is  0.9% according to the Revised Cardiac Risk Index (RCRI).  Therefore, he is at low risk for perioperative complications.   His functional capacity is good at 7.34 METs according to the Duke Activity Status Index (DASI). Recommendations: According to ACC/AHA guidelines, no further cardiovascular testing needed.  The patient may proceed to surgery at acceptable risk.   Antiplatelet and/or Anticoagulation Recommendations: The patient should remain on Aspirin  without interruption.    The patient was advised that if he develops new symptoms prior to surgery to contact our office to arrange for a follow-up visit, and he verbalized understanding. A copy of this note will be routed to requesting surgeon.  Time:   Today, I have spent 11 minutes with the patient with telehealth technology discussing medical history, symptoms, and management plan.     Brian Garron D Sanjeev Main, NP  05/22/2024, 2:55 PM

## 2024-05-23 DIAGNOSIS — L97521 Non-pressure chronic ulcer of other part of left foot limited to breakdown of skin: Secondary | ICD-10-CM | POA: Diagnosis not present

## 2024-05-23 DIAGNOSIS — E1140 Type 2 diabetes mellitus with diabetic neuropathy, unspecified: Secondary | ICD-10-CM | POA: Diagnosis not present

## 2024-05-25 NOTE — Telephone Encounter (Signed)
 Preop Clearance per 05/22/24 preop exam Dr. Gollan  1.  Preoperative Cardiovascular Risk Assessment: [name] perioperative risk of a major cardiac event is 0.9% according to the Revised Cardiac Risk Index (RCRI).  Therefore, he is at low risk for perioperative complications.   His functional capacity is good at 7.34 METs according to the Duke Activity Status Index (DASI). Recommendations: According to ACC/AHA guidelines, no further cardiovascular testing needed.  The patient may proceed to surgery at acceptable risk.   Antiplatelet and/or Anticoagulation Recommendations: The patient should remain on Aspirin  without interruption.     The patient was advised that if he develops new symptoms prior to surgery to contact our office to arrange for a follow-up visit, and he verbalized understanding. A copy of this note will be routed to requesting surgeon.

## 2024-06-21 ENCOUNTER — Encounter: Payer: Self-pay | Admitting: General Surgery

## 2024-06-28 ENCOUNTER — Ambulatory Visit
Admission: RE | Admit: 2024-06-28 | Discharge: 2024-06-28 | Disposition: A | Discharge status: Home / Self Care | Attending: General Surgery | Admitting: General Surgery

## 2024-06-28 ENCOUNTER — Ambulatory Visit: Admitting: Anesthesiology

## 2024-06-28 ENCOUNTER — Encounter: Payer: Self-pay | Admitting: General Surgery

## 2024-06-28 ENCOUNTER — Other Ambulatory Visit: Payer: Self-pay

## 2024-06-28 ENCOUNTER — Encounter
Admission: RE | Disposition: A | Discharge status: Home / Self Care | Payer: Self-pay | Source: Home / Self Care | Attending: General Surgery

## 2024-06-28 DIAGNOSIS — K641 Second degree hemorrhoids: Secondary | ICD-10-CM

## 2024-06-28 DIAGNOSIS — D128 Benign neoplasm of rectum: Secondary | ICD-10-CM

## 2024-06-28 DIAGNOSIS — I5022 Chronic systolic (congestive) heart failure: Secondary | ICD-10-CM | POA: Insufficient documentation

## 2024-06-28 DIAGNOSIS — I2510 Atherosclerotic heart disease of native coronary artery without angina pectoris: Secondary | ICD-10-CM | POA: Diagnosis not present

## 2024-06-28 DIAGNOSIS — J449 Chronic obstructive pulmonary disease, unspecified: Secondary | ICD-10-CM | POA: Diagnosis not present

## 2024-06-28 DIAGNOSIS — K644 Residual hemorrhoidal skin tags: Secondary | ICD-10-CM | POA: Insufficient documentation

## 2024-06-28 DIAGNOSIS — K621 Rectal polyp: Secondary | ICD-10-CM | POA: Insufficient documentation

## 2024-06-28 DIAGNOSIS — F17210 Nicotine dependence, cigarettes, uncomplicated: Secondary | ICD-10-CM | POA: Diagnosis not present

## 2024-06-28 DIAGNOSIS — E1142 Type 2 diabetes mellitus with diabetic polyneuropathy: Secondary | ICD-10-CM | POA: Insufficient documentation

## 2024-06-28 DIAGNOSIS — Z7984 Long term (current) use of oral hypoglycemic drugs: Secondary | ICD-10-CM | POA: Insufficient documentation

## 2024-06-28 DIAGNOSIS — I110 Hypertensive heart disease with heart failure: Secondary | ICD-10-CM | POA: Insufficient documentation

## 2024-06-28 DIAGNOSIS — Z955 Presence of coronary angioplasty implant and graft: Secondary | ICD-10-CM | POA: Diagnosis not present

## 2024-06-28 DIAGNOSIS — M1990 Unspecified osteoarthritis, unspecified site: Secondary | ICD-10-CM | POA: Insufficient documentation

## 2024-06-28 DIAGNOSIS — K219 Gastro-esophageal reflux disease without esophagitis: Secondary | ICD-10-CM | POA: Insufficient documentation

## 2024-06-28 DIAGNOSIS — Z1211 Encounter for screening for malignant neoplasm of colon: Secondary | ICD-10-CM | POA: Diagnosis not present

## 2024-06-28 DIAGNOSIS — E785 Hyperlipidemia, unspecified: Secondary | ICD-10-CM | POA: Diagnosis not present

## 2024-06-28 DIAGNOSIS — K649 Unspecified hemorrhoids: Secondary | ICD-10-CM | POA: Diagnosis not present

## 2024-06-28 DIAGNOSIS — Z860100 Personal history of colon polyps, unspecified: Secondary | ICD-10-CM

## 2024-06-28 HISTORY — PX: COLONOSCOPY: SHX5424

## 2024-06-28 SURGERY — COLONOSCOPY
Anesthesia: General

## 2024-06-28 MED ORDER — GLYCOPYRROLATE 0.2 MG/ML IJ SOLN
INTRAMUSCULAR | Status: DC | PRN
  Administered 2024-06-28: .2 mg via INTRAVENOUS

## 2024-06-28 MED ORDER — SODIUM CHLORIDE 0.9 % IV SOLN: INTRAVENOUS | Status: DC

## 2024-06-28 MED ORDER — PROPOFOL 500 MG/50ML IV EMUL
INTRAVENOUS | Status: DC | PRN
  Administered 2024-06-28: 50 mg via INTRAVENOUS
  Administered 2024-06-28: 125 ug/kg/min via INTRAVENOUS

## 2024-06-28 MED ORDER — EPHEDRINE SULFATE-NACL 50-0.9 MG/10ML-% IV SOSY
PREFILLED_SYRINGE | INTRAVENOUS | Status: DC | PRN
  Administered 2024-06-28: 10 mg via INTRAVENOUS
  Administered 2024-06-28 (×2): 5 mg via INTRAVENOUS

## 2024-06-28 MED ORDER — LIDOCAINE HCL (CARDIAC) PF 100 MG/5ML IV SOSY
PREFILLED_SYRINGE | INTRAVENOUS | Status: DC | PRN
  Administered 2024-06-28: 100 mg via INTRAVENOUS

## 2024-06-28 MED ORDER — PHENYLEPHRINE 80 MCG/ML (10ML) SYRINGE FOR IV PUSH (FOR BLOOD PRESSURE SUPPORT)
PREFILLED_SYRINGE | INTRAVENOUS | Status: DC | PRN
  Administered 2024-06-28: 80 ug via INTRAVENOUS

## 2024-06-28 MED ORDER — GLYCOPYRROLATE 0.2 MG/ML IJ SOLN
INTRAMUSCULAR | Status: AC
  Filled 2024-06-28: qty 1

## 2024-06-28 MED ORDER — EPHEDRINE 5 MG/ML INJ
INTRAVENOUS | Status: AC
  Filled 2024-06-28: qty 5

## 2024-06-28 MED ORDER — PHENYLEPHRINE 80 MCG/ML (10ML) SYRINGE FOR IV PUSH (FOR BLOOD PRESSURE SUPPORT)
PREFILLED_SYRINGE | INTRAVENOUS | Status: AC
  Filled 2024-06-28: qty 10

## 2024-06-28 NOTE — H&P (Signed)
 Primary Care Physician:  Bernardo Fend, DO Primary Gastroenterologist:  Dr. Marinda  Pre-Procedure History & Physical: HPI:  Brian Dean is a 68 y.o. male is here for an colonoscopy.   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 coli pansensitive   Hypercholesteremia    Hyperlipidemia    Hypertension    Hypertensive heart disease    Inguinal hernia    Osteomyelitis (HCC)    Osteomyelitis of left foot (HCC) 01/03/2019   Sciatica    Syncope and collapse    Tobacco use    Type 2 diabetes mellitus with diabetic neuropathy (HCC)    Ulcer of left foot (HCC)    Unspecified systolic (congestive) heart failure (HCC)    Wears dentures    partial upper and lower    Past Surgical History:  Procedure Laterality Date   AMPUTATION Left 01/04/2019   Procedure: AMPUTATION RAY LEFT GREAT TOE;  Surgeon: Neill Boas, DPM;  Location: [facility];  Service: Podiatry;  Laterality: Left;   CARDIAC CATHETERIZATION  11/14   [facility] x1   CATARACT EXTRACTION W/PHACO Left 01/03/2024   Procedure: CATARACT EXTRACTION PHACO AND INTRAOCULAR LENS PLACEMENT (IOC) LEFT DIABETIC  8.29  00:49.9;  Surgeon: Myrna Adine Anes, MD;  Location: [facility];  Service: Ophthalmology;  Laterality: Left;   CATARACT EXTRACTION W/PHACO Right 01/17/2024   Procedure: CATARACT EXTRACTION PHACO AND INTRAOCULAR LENS PLACEMENT (IOC) RIGHT DIABETIC 7.23, 00:44.4;  Surgeon: Myrna Adine Anes, MD;  Location: [facility];  Service: Ophthalmology;  Laterality:  Right;   COLONOSCOPY  2004   unc   COLONOSCOPY WITH PROPOFOL  N/A 06/22/2016   Procedure: COLONOSCOPY WITH PROPOFOL ;  Surgeon: Reyes LELON Cota, MD;  Location: [facility];  Service: Endoscopy;  Laterality: N/A;   COLONOSCOPY WITH PROPOFOL  N/A 06/28/2019   Procedure: COLONOSCOPY WITH PROPOFOL ;  Surgeon: Unk Corinn Skiff, MD;  Location: [facility];  Service: Endoscopy;  Laterality: N/A;  Diabetic - oral meds   ELBOW ARTHROSCOPY  2001   lt   ESOPHAGOGASTRODUODENOSCOPY (EGD) WITH PROPOFOL  N/A 06/28/2019   Procedure: ESOPHAGOGASTRODUODENOSCOPY (EGD) WITH PROPOFOL ;  Surgeon: Unk Corinn Skiff, MD;  Location: [facility];  Service: Endoscopy;  Laterality: N/A;   GUM SURGERY     OLECRANON BURSECTOMY  11/12/2011   Procedure: OLECRANON BURSA;  Surgeon: Prentice LELON Pagan;  Location: [facility];  Service: Orthopedics;  Laterality: Right;  olecracnon bursectomy with delayed closure   POLYPECTOMY  06/28/2019   Procedure: POLYPECTOMY;  Surgeon: Unk Corinn Skiff, MD;  Location: [facility];  Service: Endoscopy;;   WISDOM TOOTH EXTRACTION      Prior to Admission medications   Medication Sig Start Date End Date Taking? Authorizing Provider  aspirin  81 MG tablet Take 81 mg by mouth daily. Take 1 tablet (81 mg) by mouth once daily   Yes [provider]  CINNAMON PO Take 1,000 mg by mouth 2 (two) times daily. Reported on 05/06/2016   Yes [provider]  lisinopril  (ZESTRIL ) 30 MG tablet  Take 1 tablet (30 mg total) by mouth daily. 02/18/24  Yes Bernardo Fend, DO  Multiple Vitamins-Minerals (PRESERVISION AREDS 2 PO) Take by mouth.   Yes [provider]  pyridoxine  (B-6) 100 MG tablet Take 100 mg by mouth daily.   Yes [provider]  atorvastatin  (LIPITOR) 20 MG tablet TAKE 1 TABLET BY MOUTH EVERYDAY AT BEDTIME 02/18/24   Bernardo Fend, DO  finasteride  (PROSCAR ) 5 MG tablet Take 1 tablet (5 mg total) by mouth daily. 02/18/24    Bernardo Fend, DO  gabapentin  (NEURONTIN ) 300 MG capsule TAKE 1 CAPSULE BY MOUTH EVERY DAY 05/19/24   Bernardo Fend, DO  metFORMIN  (GLUCOPHAGE ) 500 MG tablet Take 1 tablet (500 mg total) by mouth 2 (two) times daily with a meal. 02/18/24   Bernardo Fend, DO  nitroGLYCERIN  (NITROSTAT ) 0.4 MG SL tablet PLACE 1 TABLET UNDER THE TONGUE EVERY 5 MINUTES AS NEEDED. 03/24/24   Perla Evalene PARAS, MD  pantoprazole  (PROTONIX ) 40 MG tablet TAKE 1 TABLET BY MOUTH EVERY DAY 05/19/24   Bernardo Fend, DO  tadalafil  (CIALIS ) 5 MG tablet Take 1 tablet (5 mg total) by mouth daily. Reported on 05/06/2016 Patient not taking: Reported on 05/17/2024 08/20/23   Bernardo Fend, DO  traMADol  (ULTRAM ) 50 MG tablet Take 1 tablet (50 mg total) by mouth every 8 (eight) hours as needed for severe pain. 08/20/23   Bernardo Fend, DO    Allergies as of 04/12/2024 - Review Complete 03/30/2024  Allergen Reaction Noted   Ibuprofen Diarrhea 12/21/2023    Family History  Problem Relation Age of Onset   Hypertension Father    Diabetes Father    Heart disease Father    Diabetes Mother    Heart disease Mother    Hypertension Mother    Cancer Mother        bladder   Diabetes Sister    Stroke Sister        mini stroke   Diabetes Brother    Diabetes Sister    Diabetes Sister    Diabetes Sister    Diabetes Sister     Social History   Socioeconomic History   Marital status: Married    Spouse name: Not on file   Number of children: Not on file   Years of education: Not on file   Highest education level: Not on file  Occupational History   Not on file  Tobacco Use   Smoking status: Every Day    Current packs/day: 0.00    Average packs/day: 0.5 packs/day for 47.0 years (23.5 ttl pk-yrs)    Types: Cigarettes    Start date: 11/09/1965    Last attempt to quit: 11/09/2012    Years since quitting: 11.6   Smokeless tobacco: Never   Tobacco comments:    down to 5 cigs/day  Vaping Use   Vaping  status: Never Used  Substance and Sexual Activity   Alcohol use: Yes    Alcohol/week: 0.0 standard drinks of alcohol    Comment: occasional (3-4x/yr)   Drug use: Not Currently   Sexual activity: Yes    Birth control/protection: None  Other Topics Concern   Not on file  Social History Narrative   Not on file   Social Drivers of Health   Financial Resource Strain: Low Risk  (03/30/2024)   Overall Financial Resource Strain (CARDIA)    Difficulty of Paying Living Expenses: Not hard at all  Food Insecurity: No Food Insecurity (03/30/2024)   Hunger Vital Sign  Worried About Programme researcher, broadcasting/film/video in the Last Year: Never true    Ran Out of Food in the Last Year: Never true  Transportation Needs: No Transportation Needs (03/30/2024)   PRAPARE - Administrator, Civil Service (Medical): No    Lack of Transportation (Non-Medical): No  Physical Activity: Insufficiently Active (03/30/2024)   Exercise Vital Sign    Days of Exercise per Week: 3 days    Minutes of Exercise per Session: 30 min  Stress: No Stress Concern Present (03/30/2024)   [employer] of Occupational Health - Occupational Stress Questionnaire    Feeling of Stress : Not at all  Social Connections: Moderately Isolated (03/30/2024)   Social Connection and Isolation Panel    Frequency of Communication with Friends and Family: More than three times a week    Frequency of Social Gatherings with Friends and Family: Once a week    Attends Religious Services: Never    Database administrator or Organizations: No    Attends Banker Meetings: Never    Marital Status: Married  Catering manager Violence: Not At Risk (03/30/2024)   Humiliation, Afraid, Rape, and Kick questionnaire    Fear of Current or Ex-Partner: No    Emotionally Abused: No    Physically Abused: No    Sexually Abused: No    Review of Systems: See HPI, otherwise negative ROS  Physical Exam: BP (!) 167/69   Pulse (!) 56   Temp (!) 97.4  F (36.3 C) (Temporal)   Resp 16   Ht 5' 11 (1.803 m)   Wt 71.7 kg   SpO2 99%   BMI 22.04 kg/m  General:   Alert,  pleasant and cooperative in NAD Head:  Normocephalic and atraumatic. Neck:  Supple; no masses or thyromegaly. Lungs:  Clear throughout to auscultation.    Heart:  Regular rate and rhythm. Abdomen:  Soft, nontender and nondistended. Normal bowel sounds, without guarding, and without rebound.   Neurologic:  Alert and  oriented x4;  grossly normal neurologically.  Impression/Plan: Brian Dean is here for an colonoscopy to be performed for screening  Risks, benefits, limitations, and alternatives regarding  colonoscopy have been reviewed with the patient.  Questions have been answered.  All parties agreeable.   Jayson MALVA Endow, MD  06/28/2024, 9:43 AM

## 2024-06-28 NOTE — Op Note (Signed)
 [facility] Gastroenterology Patient Name: Brian Dean Procedure Date: 06/28/2024 9:32 AM MRN: [id number] Account #: [id number] Date of Birth: 09/08/1956 Admit Type: Outpatient Age: 68 Room: [facility] ENDO ROOM 1 Gender: Male Note Status: Finalized Instrument Name: Colon Scope [id number] Procedure:             Colonoscopy Indications:           High risk colon cancer surveillance: Personal history                         of colonic polyps Providers:             Jayson KIDD. Marinda, MD Referring MD:          Sharyle Fischer (Referring MD) Medicines:             See the Anesthesia note for documentation of the                         administered medications Complications:         No immediate complications. Estimated blood loss: None. Procedure:             Pre-Anesthesia Assessment:                        - Prior to the procedure, a History and Physical was                         performed, and patient medications and allergies were                         reviewed. The patient's tolerance of previous                         anesthesia was also reviewed. The risks and benefits                         of the procedure and the sedation options and risks                         were discussed with the patient. All questions were                         answered, and informed consent was obtained. Prior                         Anticoagulants: The patient has taken no anticoagulant                         or antiplatelet agents. ASA Grade Assessment: II - A                         patient with mild systemic disease. After reviewing                         the risks and benefits, the patient was deemed in                         satisfactory condition to undergo the procedure.  After obtaining informed consent, the colonoscope was                         passed under direct vision. Throughout the procedure,                         the patient's blood  pressure, pulse, and oxygen                         saturations were monitored continuously. The                         Colonoscope was introduced through the anus and                         advanced to the the cecum, identified by appendiceal                         orifice and ileocecal valve. The colonoscopy was                         performed with ease. The patient tolerated the                         procedure well. The quality of the bowel preparation                         was good. Findings:      Hemorrhoids were found on perianal exam.      A 3 mm polyp was found in the mid rectum. The polyp was hyperplastic.       The polyp was removed with a cold biopsy forceps. Resection and       retrieval were complete.      External and internal hemorrhoids were found during retroflexion. The       hemorrhoids were Grade II (internal hemorrhoids that prolapse but reduce       spontaneously). Estimated blood loss was minimal.      The exam was otherwise without abnormality. Impression:            - Hemorrhoids found on perianal exam.                        - One 3 mm polyp in the mid rectum, removed with a                         cold biopsy forceps. Resected and retrieved.                        - External and internal hemorrhoids.                        - The examination was otherwise normal. Recommendation:        - Discharge patient to home.                        - Resume previous diet.                        - Repeat colonoscopy in 5  years for surveillance based                         on personal history of previous adenomatous polyps. Procedure Code(s):     --- Professional ---                        [phone], Colonoscopy, flexible; with biopsy, single or                         multiple CPT copyright 2022 [employer]. All rights reserved. The codes documented in this report are preliminary and upon coder review may  be revised to meet current compliance  requirements. Jayson MALVA Endow, MD 06/28/2024 10:23:48 AM Number of Addenda: 0 Note Initiated On: 06/28/2024 9:32 AM Scope Withdrawal Time: 0 hours 17 minutes 30 seconds  Total Procedure Duration: 0 hours 26 minutes 1 second  Estimated Blood Loss:  Estimated blood loss was minimal.      [facility]

## 2024-06-28 NOTE — Discharge Instructions (Signed)
 YOU HAD AN ENDOSCOPIC PROCEDURE TODAY: Refer to the procedure report that was given to you for any specific questions about what was found during the examination.  If the procedure report does not answer your questions, please call your gastroenterologist to clarify.  YOU SHOULD EXPECT: Some feelings of bloating in the abdomen. Passage of more gas than usual.  Walking can help get rid of the air that was put into your GI tract during the procedure and reduce the bloating. If you had a lower endoscopy (such as a colonoscopy or flexible sigmoidoscopy) you may notice spotting of blood in your stool or on the toilet paper.   DIET: Your first meal following the procedure should be a light meal and then it is ok to progress to your normal diet.  A half-sandwich or bowl of soup is an example of a good first meal.  Heavy or fried foods are harder to digest and may make you feel nasueas or bloated.  Drink plenty of fluids but you should avoid alcoholic beverages for 24 hours.  ACTIVITY: Your care partner should take you home directly after the procedure.  You should plan to take it easy, moving slowly for the rest of the day.  You can resume normal activity the day after the procedure however you should NOT DRIVE, make legal decisions or use heavy machinery for 24 hours (because of the sedation medicines used during the test).    SYMPTOMS TO REPORT IMMEDIATELY  A gastroenterologist can be reached at any hour.  Please call your doctor's office for any of the following symptoms:  Following lower endoscopy (colonoscopy, flexible sigmoidoscopy)  Excessive amounts of blood in the stool  Significant tenderness, worsening of abdominal pains  Swelling of the abdomen that is new, acute  Fever of 100 or higher Following upper endoscopy (EGD, EUS, ERCP)  Vomiting of blood or coffee ground material  New, significant abdominal pain  New, significant chest pain or pain under the shoulder blades  Painful or  persistently difficult swallowing  New shortness of breath  Black, tarry-looking stools  FOLLOW UP: If any biopsies were taken you will be contacted by phone or by letter within the next 1-3 weeks.  Call your gastroenterologist if you have not heard about the biopsies in 3 weeks.   Please also call your gastroenterologist's office with any specific questions about appointments or follow up tests.

## 2024-06-28 NOTE — Transfer of Care (Signed)
 Immediate Anesthesia Transfer of Care Note  Patient: Brian Dean  Procedure(s) Performed: COLONOSCOPY  Patient Location: PACU  Anesthesia Type:MAC  Level of Consciousness: sedated  Airway & Oxygen Therapy: Patient Spontanous Breathing  Post-op Assessment: Report given to RN and Post -op Vital signs reviewed and stable  Post vital signs: stable  Last Vitals:  Vitals Value Taken Time  BP 92/55 06/28/24 10:23  Temp    Pulse 66 06/28/24 10:24  Resp 13 06/28/24 10:24  SpO2 100 % 06/28/24 10:24  Vitals shown include unfiled device data.  Last Pain:  Vitals:   06/28/24 1020  TempSrc:   PainSc: Asleep         Complications: No notable events documented.

## 2024-06-28 NOTE — Anesthesia Preprocedure Evaluation (Addendum)
 Anesthesia Evaluation  Patient identified by MRN, date of birth, ID band Patient awake    Reviewed: Allergy & Precautions, NPO status , Patient's Chart, lab work & pertinent test results  History of Anesthesia Complications Negative for: history of anesthetic complications  Airway Mallampati: I   Neck ROM: Full    Dental  (+) Partial Lower, Partial Upper   Pulmonary COPD, Current Smoker (6 cigarettes per day) and Patient abstained from smoking.   Pulmonary exam normal breath sounds clear to auscultation       Cardiovascular hypertension, + CAD (s/p stent)  Normal cardiovascular exam Rhythm:Regular Rate:Normal  ECG 03/24/24:  Sinus rhythm with occasional Premature ventricular complexes Left axis deviation Right bundle branch block   Neuro/Psych  Neuromuscular disease (diabetic polyneuropathy)    GI/Hepatic ,GERD  ,,  Endo/Other  diabetes, Type 2    Renal/GU negative Renal ROS     Musculoskeletal  (+) Arthritis ,    Abdominal   Peds  Hematology  (+) Blood dyscrasia, anemia   Anesthesia Other Findings Cardiology note 03/24/24:  ASSESSMENT AND PLAN:   Hypertensive heart disease without heart failure Blood pressure elevated on today's visit, reports he just took his medicine, does not like driving and heavy traffic Well-controlled with primary care 1 month ago and in October 24 No changes made at this time, recommend close monitoring of his blood pressure at home   Coronary artery disease involving native coronary artery of native heart without angina pectoris Currently with no symptoms of angina. No further workup at this time. Continue current medication regimen.  Cholesterol and diabetes numbers relatively well-controlled Smoking cessation recommended   Mixed hyperlipidemia Cholesterol is at goal on the current lipid regimen. No changes to the medications were made.   Type 2 diabetes mellitus without  complication, without long-term current use of insulin  (HCC) A1C 6.5, stable Active in the summer   Smoker Still smoking Previously did not want chantix Cessation recommended   S/P coronary artery stent placement No further testing   Reproductive/Obstetrics                              Anesthesia Physical Anesthesia Plan  ASA: 3  Anesthesia Plan: General   Post-op Pain Management:    Induction: Intravenous  PONV Risk Score and Plan: 1 and Propofol  infusion, TIVA and Treatment may vary due to age or medical condition  Airway Management Planned: Natural Airway  Additional Equipment:   Intra-op Plan:   Post-operative Plan:   Informed Consent: I have reviewed the patients History and Physical, chart, labs and discussed the procedure including the risks, benefits and alternatives for the proposed anesthesia with the patient or authorized representative who has indicated his/her understanding and acceptance.       Plan Discussed with: CRNA  Anesthesia Plan Comments: (LMA/GETA backup discussed.  Patient consented for risks of anesthesia including but not limited to:  - adverse reactions to medications - damage to eyes, teeth, lips or other oral mucosa - nerve damage due to positioning  - sore throat or hoarseness - damage to heart, brain, nerves, lungs, other parts of body or loss of life  Informed patient about role of CRNA in peri- and intra-operative care.  Patient voiced understanding.)         Anesthesia Quick Evaluation

## 2024-06-29 LAB — SURGICAL PATHOLOGY

## 2024-06-29 NOTE — Anesthesia Postprocedure Evaluation (Signed)
 Anesthesia Post Note  Patient: Brian Dean  Procedure(s) Performed: COLONOSCOPY  Patient location during evaluation: PACU Anesthesia Type: General Level of consciousness: awake and alert, oriented and patient cooperative Pain management: pain level controlled Vital Signs Assessment: post-procedure vital signs reviewed and stable Respiratory status: spontaneous breathing, nonlabored ventilation and respiratory function stable Cardiovascular status: blood pressure returned to baseline and stable Postop Assessment: adequate PO intake Anesthetic complications: no   No notable events documented.   Last Vitals:  Vitals:   06/28/24 1033 06/28/24 1043  BP: 104/63 115/88  Pulse: 67 69  Resp: 18 12  Temp: (!) 36.4 C (!) 36.4 C  SpO2: 99% 99%    Last Pain:  Vitals:   06/28/24 1043  TempSrc: Temporal  PainSc: 0-No pain                 Tylek Boney

## 2024-07-04 DIAGNOSIS — L851 Acquired keratosis [keratoderma] palmaris et plantaris: Secondary | ICD-10-CM | POA: Diagnosis not present

## 2024-07-04 DIAGNOSIS — E1140 Type 2 diabetes mellitus with diabetic neuropathy, unspecified: Secondary | ICD-10-CM | POA: Diagnosis not present

## 2024-07-04 DIAGNOSIS — Z89412 Acquired absence of left great toe: Secondary | ICD-10-CM | POA: Diagnosis not present

## 2024-07-04 DIAGNOSIS — B351 Tinea unguium: Secondary | ICD-10-CM | POA: Diagnosis not present

## 2024-08-15 DIAGNOSIS — Z8631 Personal history of diabetic foot ulcer: Secondary | ICD-10-CM | POA: Diagnosis not present

## 2024-08-15 DIAGNOSIS — E1140 Type 2 diabetes mellitus with diabetic neuropathy, unspecified: Secondary | ICD-10-CM | POA: Diagnosis not present

## 2024-08-18 ENCOUNTER — Encounter: Payer: Self-pay | Admitting: Internal Medicine

## 2024-08-18 ENCOUNTER — Ambulatory Visit: Admitting: Internal Medicine

## 2024-08-18 ENCOUNTER — Other Ambulatory Visit: Payer: Self-pay

## 2024-08-18 VITALS — BP 128/72 | HR 73 | Temp 97.7°F | Resp 16 | Ht 71.0 in | Wt 161.8 lb

## 2024-08-18 DIAGNOSIS — M5441 Lumbago with sciatica, right side: Secondary | ICD-10-CM

## 2024-08-18 DIAGNOSIS — N401 Enlarged prostate with lower urinary tract symptoms: Secondary | ICD-10-CM

## 2024-08-18 DIAGNOSIS — Z7984 Long term (current) use of oral hypoglycemic drugs: Secondary | ICD-10-CM | POA: Diagnosis not present

## 2024-08-18 DIAGNOSIS — Z23 Encounter for immunization: Secondary | ICD-10-CM

## 2024-08-18 DIAGNOSIS — E1142 Type 2 diabetes mellitus with diabetic polyneuropathy: Secondary | ICD-10-CM | POA: Diagnosis not present

## 2024-08-18 DIAGNOSIS — E782 Mixed hyperlipidemia: Secondary | ICD-10-CM | POA: Diagnosis not present

## 2024-08-18 DIAGNOSIS — G8929 Other chronic pain: Secondary | ICD-10-CM | POA: Diagnosis not present

## 2024-08-18 DIAGNOSIS — I10 Essential (primary) hypertension: Secondary | ICD-10-CM

## 2024-08-18 LAB — POCT GLYCOSYLATED HEMOGLOBIN (HGB A1C): Hemoglobin A1C: 6.1 % — AB (ref 4.0–5.6)

## 2024-08-18 MED ORDER — ATORVASTATIN CALCIUM 20 MG PO TABS: ORAL_TABLET | ORAL | 1 refills | Status: AC

## 2024-08-18 MED ORDER — METFORMIN HCL 500 MG PO TABS: 500.0000 mg | ORAL_TABLET | Freq: Two times a day (BID) | ORAL | 1 refills | Status: AC

## 2024-08-18 MED ORDER — TRAMADOL HCL 50 MG PO TABS: 50.0000 mg | ORAL_TABLET | Freq: Three times a day (TID) | ORAL | 0 refills | Status: AC | PRN

## 2024-08-18 MED ORDER — FINASTERIDE 5 MG PO TABS: 5.0000 mg | ORAL_TABLET | Freq: Every day | ORAL | 1 refills | Status: AC

## 2024-08-18 MED ORDER — LISINOPRIL 30 MG PO TABS: 30.0000 mg | ORAL_TABLET | Freq: Every day | ORAL | 1 refills | Status: AC

## 2024-08-18 NOTE — Progress Notes (Signed)
 Established Patient Office Visit  Subjective:  Patient ID: Brian Dean, male    DOB: 21-Jul-1956  Age: 68 y.o. MRN: [id number]  CC:  Chief Complaint  Patient presents with   Medical Management of Chronic Issues    6 month recheck    HPI [name] presents for follow up on chronic medical conditions.   Discussed the use of AI scribe software for clinical note transcription with the patient, who gave verbal consent to proceed.  History of Present Illness Brian Dean is a 68 year old male who presents for a routine follow-up visit.  His blood pressure is 128/72 mmHg, and he continues to take lisinopril . Diabetes is managed with metformin  500 mg twice daily, with an A1c improved from 6.5% to 6.1%. He takes gabapentin  300 mg and pantoprazole  40 mg for acid reflux, which is effective. Lipitor is taken for cholesterol management, and finasteride  5 mg is taken once daily.  Following cataract surgery in March, he used tramadol  and gabapentin  for pain management for seven days post-surgery. He has not been taking Cialis , noting no sexual activity in about two years.  A colonoscopy last month revealed a 3 mm hyperplastic polyp, which was removed. He has a history of precancerous polyps found in 2021.  He received a flu vaccine during this visit and has had pneumonia and tetanus vaccines. He has not yet received the shingles vaccine.  He has not visited a dentist since he 'fired' his previous one two years ago.   Hypertension: -Medications: Lisinopril  30 mg -Patient is compliant with above medications and reports no side effects. -Checking BP at home (average): has a cuff but not checking -Denies any SOB, CP, vision changes, LE edema or symptoms of hypotension -Follows with Cardiology  HLD/CAD: -Medications: Lipitor 20 mg -Patient is compliant with above medications and reports no side effects.  -Last lipid panel: Lipid Panel     Component Value Date/Time   CHOL 125  02/18/2024 1334   CHOL 129 10/31/2015 0924   TRIG 251 (H) 02/18/2024 1334   HDL 43 02/18/2024 1334   HDL 43 10/31/2015 0924   CHOLHDL 2.9 02/18/2024 1334   VLDL 19 04/27/2017 0917   LDLCALC 52 02/18/2024 1334   LABVLDL 14 10/31/2015 0924    Diabetes, Type 2 with polyneuropathy: -Last A1c 6.0% 10/24 -Medications: Metformin  500 mg BID, Gabapentin  300 mg once daily  -Patient is compliant with the above medications and reports no side effects.  -Checking BG at home: doesn't check  -Eye exam: UTD  -Foot exam: UTD -Microalbumin: Due -Statin: yes -PNA vaccine: UTD -Denies symptoms of hypoglycemia, polyuria, polydipsia, numbness extremities, foot ulcers/trauma.   GERD: -Currently on Protonix  40 mg, symptoms stable  Lumbar Pain:  -Cannot take NSAIDs -Takes Tramadol  50 mg rarely, does need refills today  Health Maintenance: -Blood work UTD -Colonoscopy 06/2024: repeat in 5 years   Past Medical History:  Diagnosis Date   Anemia    Arthritis    Atherosclerosis of native coronary artery with stable angina pectoris    CAD (coronary artery disease) 09/2013   diffuse, aggresive risk factor modification recommended   Diabetes mellitus without complication (HCC)    Diabetic neuropathy (HCC)    Diabetic polyneuropathy (HCC)    Dyslipidemia    ED (erectile dysfunction)    GERD (gastroesophageal reflux disease)    History of acute pyelonephritis 06/2013   hospitalized; 100k E. coli pansensitive   Hypercholesteremia    Hyperlipidemia    Hypertension  Hypertensive heart disease    Inguinal hernia    Osteomyelitis (HCC)    Osteomyelitis of left foot (HCC) 01/03/2019   Sciatica    Syncope and collapse    Tobacco use    Type 2 diabetes mellitus with diabetic neuropathy (HCC)    Ulcer of left foot (HCC)    Unspecified systolic (congestive) heart failure (HCC)    Wears dentures    partial upper and lower    Past Surgical History:  Procedure Laterality Date   AMPUTATION Left  01/04/2019   Procedure: AMPUTATION RAY LEFT GREAT TOE;  Surgeon: Neill Boas, DPM;  Location: [facility];  Service: Podiatry;  Laterality: Left;   CARDIAC CATHETERIZATION  11/14   [facility] x1   CATARACT EXTRACTION W/PHACO Left 01/03/2024   Procedure: CATARACT EXTRACTION PHACO AND INTRAOCULAR LENS PLACEMENT (IOC) LEFT DIABETIC  8.29  00:49.9;  Surgeon: Myrna Adine Anes, MD;  Location: [facility];  Service: Ophthalmology;  Laterality: Left;   CATARACT EXTRACTION W/PHACO Right 01/17/2024   Procedure: CATARACT EXTRACTION PHACO AND INTRAOCULAR LENS PLACEMENT (IOC) RIGHT DIABETIC 7.23, 00:44.4;  Surgeon: Myrna Adine Anes, MD;  Location: [facility];  Service: Ophthalmology;  Laterality: Right;   COLONOSCOPY  2004   [facility]   COLONOSCOPY N/A 06/28/2024   Procedure: COLONOSCOPY;  Surgeon: Marinda Jayson KIDD, MD;  Location: [facility];  Service: Endoscopy;  Laterality: N/A;   COLONOSCOPY WITH PROPOFOL  N/A 06/22/2016   Procedure: COLONOSCOPY WITH PROPOFOL ;  Surgeon: Reyes LELON Cota, MD;  Location: [facility] END[facility];  Service: Endoscopy;  Laterality: N/A;   COLONOSCOPY WITH PROPOFOL  N/A 06/28/2019   Procedure: COLONOSCOPY WITH PROPOFOL ;  Surgeon: Unk Corinn Skiff, MD;  Location: [facility];  Service: Endoscopy;  Laterality: N/A;  Diabetic - oral meds   ELBOW ARTHROSCOPY  2001   lt   ESOPHAGOGASTRODUODENOSCOPY (EGD) WITH PROPOFOL  N/A 06/28/2019   Procedure: ESOPHAGOGASTRODUODENOSCOPY (EGD) WITH PROPOFOL ;  Surgeon: Unk Corinn Skiff, MD;  Location: [facility];  Service: Endoscopy;  Laterality: N/A;   GUM SURGERY     OLECRANON BURSECTOMY  11/12/2011   Procedure: OLECRANON BURSA;  Surgeon: Prentice LELON Pagan;  Location: [facility];  Service: Orthopedics;  Laterality: Right;  olecracnon bursectomy with delayed closure   POLYPECTOMY  06/28/2019   Procedure: POLYPECTOMY;  Surgeon: Unk Corinn Skiff, MD;  Location: [facility];  Service: Endoscopy;;   WISDOM TOOTH  EXTRACTION      Family History  Problem Relation Age of Onset   Hypertension Father    Diabetes Father    Heart disease Father    Diabetes Mother    Heart disease Mother    Hypertension Mother    Cancer Mother        bladder   Diabetes Sister    Stroke Sister        mini stroke   Diabetes Brother    Diabetes Sister    Diabetes Sister    Diabetes Sister    Diabetes Sister     Social History   Socioeconomic History   Marital status: Married    Spouse name: Not on file   Number of children: Not on file   Years of education: Not on file   Highest education level: Not on file  Occupational History   Not on file  Tobacco Use   Smoking status: Every Day    Current packs/day: 0.00    Average packs/day: 0.5 packs/day for 47.0 years (23.5 ttl pk-yrs)    Types: Cigarettes  Start date: 11/09/1965    Last attempt to quit: 11/09/2012    Years since quitting: 11.7   Smokeless tobacco: Never   Tobacco comments:    down to 5 cigs/day  Vaping Use   Vaping status: Never Used  Substance and Sexual Activity   Alcohol use: Yes    Alcohol/week: 0.0 standard drinks of alcohol    Comment: occasional (3-4x/yr)   Drug use: Not Currently   Sexual activity: Yes    Birth control/protection: None  Other Topics Concern   Not on file  Social History Narrative   Not on file   Social Drivers of Health   Financial Resource Strain: Low Risk  (03/30/2024)   Overall Financial Resource Strain (CARDIA)    Difficulty of Paying Living Expenses: Not hard at all  Food Insecurity: No Food Insecurity (03/30/2024)   Hunger Vital Sign    Worried About Running Out of Food in the Last Year: Never true    Ran Out of Food in the Last Year: Never true  Transportation Needs: No Transportation Needs (03/30/2024)   PRAPARE - Administrator, Civil Service (Medical): No    Lack of Transportation (Non-Medical): No  Physical Activity: Insufficiently Active (03/30/2024)   Exercise Vital Sign    Days  of Exercise per Week: 3 days    Minutes of Exercise per Session: 30 min  Stress: No Stress Concern Present (03/30/2024)   [employer] of Occupational Health - Occupational Stress Questionnaire    Feeling of Stress : Not at all  Social Connections: Moderately Isolated (03/30/2024)   Social Connection and Isolation Panel    Frequency of Communication with Friends and Family: More than three times a week    Frequency of Social Gatherings with Friends and Family: Once a week    Attends Religious Services: Never    Database administrator or Organizations: No    Attends Banker Meetings: Never    Marital Status: Married  Catering manager Violence: Not At Risk (03/30/2024)   Humiliation, Afraid, Rape, and Kick questionnaire    Fear of Current or Ex-Partner: No    Emotionally Abused: No    Physically Abused: No    Sexually Abused: No    Outpatient Medications Prior to Visit  Medication Sig Dispense Refill   aspirin  81 MG tablet Take 81 mg by mouth daily. Take 1 tablet (81 mg) by mouth once daily     atorvastatin  (LIPITOR) 20 MG tablet TAKE 1 TABLET BY MOUTH EVERYDAY AT BEDTIME 90 tablet 1   CINNAMON PO Take 1,000 mg by mouth 2 (two) times daily. Reported on 05/06/2016     finasteride  (PROSCAR ) 5 MG tablet Take 1 tablet (5 mg total) by mouth daily. 90 tablet 1   gabapentin  (NEURONTIN ) 300 MG capsule TAKE 1 CAPSULE BY MOUTH EVERY DAY 90 capsule 1   lisinopril  (ZESTRIL ) 30 MG tablet Take 1 tablet (30 mg total) by mouth daily. 90 tablet 1   metFORMIN  (GLUCOPHAGE ) 500 MG tablet Take 1 tablet (500 mg total) by mouth 2 (two) times daily with a meal. 180 tablet 1   Multiple Vitamins-Minerals (PRESERVISION AREDS 2 PO) Take by mouth.     nitroGLYCERIN  (NITROSTAT ) 0.4 MG SL tablet PLACE 1 TABLET UNDER THE TONGUE EVERY 5 MINUTES AS NEEDED. 25 tablet 3   pantoprazole  (PROTONIX ) 40 MG tablet TAKE 1 TABLET BY MOUTH EVERY DAY 90 tablet 1   pyridoxine  (B-6) 100 MG tablet Take 100 mg by mouth  daily.     tadalafil  (CIALIS ) 5 MG tablet Take 1 tablet (5 mg total) by mouth daily. Reported on 05/06/2016 (Patient not taking: Reported on 05/17/2024) 30 tablet 0   traMADol  (ULTRAM ) 50 MG tablet Take 1 tablet (50 mg total) by mouth every 8 (eight) hours as needed for severe pain. 20 tablet 0   No facility-administered medications prior to visit.    Allergies  Allergen Reactions   Ibuprofen Diarrhea    ROS Review of Systems  All other systems reviewed and are negative.     Objective:    Physical Exam Constitutional:      Appearance: Normal appearance.  HENT:     Head: Normocephalic and atraumatic.  Eyes:     Conjunctiva/sclera: Conjunctivae normal.  Neck:     Comments: No thyromegaly  Cardiovascular:     Rate and Rhythm: Normal rate and regular rhythm.  Pulmonary:     Effort: Pulmonary effort is normal.     Breath sounds: Normal breath sounds.  Musculoskeletal:     Cervical back: No tenderness.     Right lower leg: No edema.     Left lower leg: No edema.  Lymphadenopathy:     Cervical: No cervical adenopathy.  Skin:    General: Skin is warm and dry.  Neurological:     General: No focal deficit present.     Mental Status: He is alert. Mental status is at baseline.  Psychiatric:        Mood and Affect: Mood normal.        Behavior: Behavior normal.     BP 128/72 (Cuff Size: Large)   Pulse 73   Temp 97.7 F (36.5 C) (Oral)   Resp 16   Ht 5' 11 (1.803 m)   Wt 161 lb 12.8 oz (73.4 kg)   SpO2 95%   BMI 22.57 kg/m  Wt Readings from Last 3 Encounters:  08/18/24 161 lb 12.8 oz (73.4 kg)  06/28/24 158 lb (71.7 kg)  03/24/24 163 lb 4 oz (74 kg)     Health Maintenance Due  Topic Date Due   Zoster Vaccines- Shingrix (1 of 2) Never done   Lung Cancer Screening  Never done   Diabetic kidney evaluation - Urine ACR  08/19/2024    There are no preventive care reminders to display for this patient.  No results found for: TSH Lab Results  Component Value  Date   WBC 6.8 02/18/2024   HGB 12.6 (L) 02/18/2024   HCT 38.1 (L) 02/18/2024   MCV 94.3 02/18/2024   PLT 225 02/18/2024   Lab Results  Component Value Date   NA 138 02/18/2024   K 4.7 02/18/2024   CO2 28 02/18/2024   GLUCOSE 142 (H) 02/18/2024   BUN 17 02/18/2024   CREATININE 1.20 02/18/2024   BILITOT 0.8 02/18/2024   ALKPHOS 48 01/03/2019   AST 22 02/18/2024   ALT 24 02/18/2024   PROT 6.7 02/18/2024   ALBUMIN 3.7 01/03/2019   CALCIUM  9.4 02/18/2024   ANIONGAP 10 01/05/2019   EGFR 77 02/18/2023   Lab Results  Component Value Date   CHOL 125 02/18/2024   Lab Results  Component Value Date   HDL 43 02/18/2024   Lab Results  Component Value Date   LDLCALC 52 02/18/2024   Lab Results  Component Value Date   TRIG 251 (H) 02/18/2024   Lab Results  Component Value Date   CHOLHDL 2.9 02/18/2024   Lab Results  Component Value  Date   HGBA1C 6.5 (H) 02/18/2024      Assessment & Plan:   Assessment & Plan Type 2 diabetes mellitus with diabetic polyneuropathy Type 2 diabetes managed with metformin . A1c at 6.1 shows good control. - Continue metformin  500 mg twice daily. - Monitor A1c every 6 months. - Perform urine test to assess kidney function.  Hypertension Blood pressure stable here today, no changes made to medications and appropriate refills sent to pharmacy.   Hyperlipidemia Stable, refill statin.   BPH Symptoms stable, refill Finasteride .   Chronic Back Pain Rarely takes Tramadol , will refill once a year.   General Health Maintenance Health maintenance up to date with vaccinations. Colonoscopy follow-up in 5 years due to previous polyps. Dental care overdue. - Recommend shingles vaccine at pharmacy. - Schedule colonoscopy in 5 years.  - POCT HgB A1C - metFORMIN  (GLUCOPHAGE ) 500 MG tablet; Take 1 tablet (500 mg total) by mouth 2 (two) times daily with a meal.  Dispense: 180 tablet; Refill: 1 - Urine Microalbumin w/creat. ratio - lisinopril   (ZESTRIL ) 30 MG tablet; Take 1 tablet (30 mg total) by mouth daily.  Dispense: 90 tablet; Refill: 1 - atorvastatin  (LIPITOR) 20 MG tablet; TAKE 1 TABLET BY MOUTH EVERYDAY AT BEDTIME  Dispense: 90 tablet; Refill: 1 - finasteride  (PROSCAR ) 5 MG tablet; Take 1 tablet (5 mg total) by mouth daily.  Dispense: 90 tablet; Refill: 1 - traMADol  (ULTRAM ) 50 MG tablet; Take 1 tablet (50 mg total) by mouth every 8 (eight) hours as needed for severe pain (pain score 7-10).  Dispense: 20 tablet; Refill: 0 - Flu vaccine HIGH DOSE PF(Fluzone Trivalent)   Follow-up: Return in about 6 months (around 02/16/2025).    Sharyle Fischer, DO

## 2024-08-19 LAB — MICROALBUMIN / CREATININE URINE RATIO
Creatinine, Urine: 79 mg/dL (ref 20–320)
Microalb Creat Ratio: 115 mg/g{creat} — ABNORMAL HIGH (ref <30)
Microalb, Ur: 9.1 mg/dL

## 2024-08-21 ENCOUNTER — Ambulatory Visit: Payer: Self-pay | Admitting: Internal Medicine

## 2024-08-24 NOTE — Progress Notes (Signed)
 Brian Dean                                          MRN: [id number]   08/24/2024   The [facility] Quality Team Specialist reviewed this patient medical record for the purposes of chart review for care gap closure. The following were reviewed: abstraction for care gap closure-controlling blood pressure.    [facility] Quality Team

## 2024-08-24 NOTE — Progress Notes (Signed)
 Brian Dean                                          MRN: [id number]   08/24/2024   The [facility] Quality Team Specialist reviewed this patient medical record for the purposes of chart review for care gap closure. The following were reviewed: chart review for care gap closure-kidney health evaluation for diabetes:eGFR  and uACR.    [facility] Quality Team

## 2024-11-17 ENCOUNTER — Other Ambulatory Visit: Payer: Self-pay | Admitting: Internal Medicine

## 2024-11-17 DIAGNOSIS — E1142 Type 2 diabetes mellitus with diabetic polyneuropathy: Secondary | ICD-10-CM

## 2024-11-17 NOTE — Telephone Encounter (Signed)
 Requested Prescriptions  Pending Prescriptions Disp Refills   gabapentin  (NEURONTIN ) 300 MG capsule [Pharmacy Med Name: GABAPENTIN  300 MG CAPSULE] 90 capsule 1    Sig: TAKE 1 CAPSULE BY MOUTH EVERY DAY     Neurology: Anticonvulsants - gabapentin  Passed - 11/17/2024  1:47 PM      Passed - Cr in normal range and within 360 days    Creat  Date Value Ref Range Status  02/18/2024 1.20 0.70 - 1.35 mg/dL Final   Creatinine, Urine  Date Value Ref Range Status  08/18/2024 79 20 - 320 mg/dL Final         Passed - Completed PHQ-2 or PHQ-9 in the last 360 days      Passed - Valid encounter within last 12 months    Recent Outpatient Visits           3 months ago Controlled type 2 diabetes mellitus with diabetic polyneuropathy, without long-term current use of insulin  [facility])   [facility] Bernardo Fend, DO   9 months ago Controlled type 2 diabetes mellitus with diabetic polyneuropathy, without long-term current use of insulin  [facility])   [facility] Bernardo Fend, [place]

## 2025-02-16 ENCOUNTER — Ambulatory Visit: Admitting: Internal Medicine

## 2025-02-21 ENCOUNTER — Ambulatory Visit: Admitting: Internal Medicine

## 2025-04-19 ENCOUNTER — Ambulatory Visit
